# Patient Record
Sex: Female | Born: 1960 | Race: White | Hispanic: No | Smoking: Current some day smoker
Health system: Southern US, Community
[De-identification: ages and names within clinical notes are randomized; demographics above are authoritative.]

## PROBLEM LIST (undated history)

## (undated) DIAGNOSIS — M1990 Unspecified osteoarthritis, unspecified site: Secondary | ICD-10-CM

## (undated) DIAGNOSIS — R531 Weakness: Secondary | ICD-10-CM

## (undated) DIAGNOSIS — G8929 Other chronic pain: Secondary | ICD-10-CM

## (undated) DIAGNOSIS — F988 Other specified behavioral and emotional disorders with onset usually occurring in childhood and adolescence: Secondary | ICD-10-CM

## (undated) DIAGNOSIS — J45909 Unspecified asthma, uncomplicated: Secondary | ICD-10-CM

## (undated) DIAGNOSIS — G4700 Insomnia, unspecified: Secondary | ICD-10-CM

## (undated) DIAGNOSIS — F209 Schizophrenia, unspecified: Secondary | ICD-10-CM

## (undated) DIAGNOSIS — Z8719 Personal history of other diseases of the digestive system: Secondary | ICD-10-CM

## (undated) DIAGNOSIS — J42 Unspecified chronic bronchitis: Secondary | ICD-10-CM

## (undated) DIAGNOSIS — R109 Unspecified abdominal pain: Secondary | ICD-10-CM

## (undated) DIAGNOSIS — K5090 Crohn's disease, unspecified, without complications: Secondary | ICD-10-CM

## (undated) DIAGNOSIS — J189 Pneumonia, unspecified organism: Secondary | ICD-10-CM

## (undated) DIAGNOSIS — M62838 Other muscle spasm: Secondary | ICD-10-CM

## (undated) DIAGNOSIS — R011 Cardiac murmur, unspecified: Secondary | ICD-10-CM

## (undated) DIAGNOSIS — K219 Gastro-esophageal reflux disease without esophagitis: Secondary | ICD-10-CM

## (undated) DIAGNOSIS — M2550 Pain in unspecified joint: Secondary | ICD-10-CM

## (undated) DIAGNOSIS — E785 Hyperlipidemia, unspecified: Secondary | ICD-10-CM

## (undated) DIAGNOSIS — B1920 Unspecified viral hepatitis C without hepatic coma: Secondary | ICD-10-CM

## (undated) DIAGNOSIS — G43909 Migraine, unspecified, not intractable, without status migrainosus: Secondary | ICD-10-CM

## (undated) DIAGNOSIS — M545 Low back pain: Secondary | ICD-10-CM

## (undated) DIAGNOSIS — M542 Cervicalgia: Secondary | ICD-10-CM

## (undated) DIAGNOSIS — F410 Panic disorder [episodic paroxysmal anxiety] without agoraphobia: Secondary | ICD-10-CM

## (undated) DIAGNOSIS — M2540 Effusion, unspecified joint: Secondary | ICD-10-CM

## (undated) DIAGNOSIS — F419 Anxiety disorder, unspecified: Secondary | ICD-10-CM

## (undated) DIAGNOSIS — J302 Other seasonal allergic rhinitis: Secondary | ICD-10-CM

## (undated) HISTORY — PX: CHOLECYSTECTOMY: SHX55

## (undated) HISTORY — PX: UMBILICAL HERNIA REPAIR: SHX196

## (undated) HISTORY — PX: VAGINAL HYSTERECTOMY: SUR661

## (undated) HISTORY — PX: EXCISIONAL HEMORRHOIDECTOMY: SHX1541

## (undated) HISTORY — PX: COLONOSCOPY: SHX174

## (undated) HISTORY — PX: MANDIBLE FRACTURE SURGERY: SHX706

## (undated) HISTORY — PX: BACK SURGERY: SHX140

## (undated) HISTORY — PX: COLON RESECTION: SHX5231

## (undated) HISTORY — PX: FRACTURE SURGERY: SHX138

## (undated) HISTORY — PX: APPENDECTOMY: SHX54

## (undated) HISTORY — PX: FINGER AMPUTATION: SHX636

## (undated) HISTORY — PX: ESOPHAGOGASTRODUODENOSCOPY: SHX1529

---

## 2014-12-31 DIAGNOSIS — J189 Pneumonia, unspecified organism: Secondary | ICD-10-CM

## 2014-12-31 HISTORY — DX: Pneumonia, unspecified organism: J18.9

## 2015-08-10 ENCOUNTER — Emergency Department (HOSPITAL_COMMUNITY): Payer: Medicare Other

## 2015-08-10 ENCOUNTER — Inpatient Hospital Stay (HOSPITAL_COMMUNITY)
Admission: EM | Admit: 2015-08-10 | Discharge: 2015-08-15 | DRG: 194 | Disposition: A | Discharge status: Skilled Nursing Facility | Payer: Medicare Other | Attending: Internal Medicine | Admitting: Internal Medicine

## 2015-08-10 ENCOUNTER — Encounter (HOSPITAL_COMMUNITY): Payer: Self-pay | Admitting: Emergency Medicine

## 2015-08-10 DIAGNOSIS — G8929 Other chronic pain: Secondary | ICD-10-CM | POA: Diagnosis present

## 2015-08-10 DIAGNOSIS — F1120 Opioid dependence, uncomplicated: Secondary | ICD-10-CM | POA: Diagnosis present

## 2015-08-10 DIAGNOSIS — F419 Anxiety disorder, unspecified: Secondary | ICD-10-CM | POA: Diagnosis present

## 2015-08-10 DIAGNOSIS — F29 Unspecified psychosis not due to a substance or known physiological condition: Secondary | ICD-10-CM

## 2015-08-10 DIAGNOSIS — M549 Dorsalgia, unspecified: Secondary | ICD-10-CM | POA: Diagnosis present

## 2015-08-10 DIAGNOSIS — K5090 Crohn's disease, unspecified, without complications: Secondary | ICD-10-CM | POA: Diagnosis present

## 2015-08-10 DIAGNOSIS — R0789 Other chest pain: Secondary | ICD-10-CM

## 2015-08-10 DIAGNOSIS — R45851 Suicidal ideations: Secondary | ICD-10-CM

## 2015-08-10 DIAGNOSIS — F1110 Opioid abuse, uncomplicated: Secondary | ICD-10-CM

## 2015-08-10 DIAGNOSIS — F909 Attention-deficit hyperactivity disorder, unspecified type: Secondary | ICD-10-CM | POA: Diagnosis present

## 2015-08-10 DIAGNOSIS — J189 Pneumonia, unspecified organism: Secondary | ICD-10-CM | POA: Diagnosis not present

## 2015-08-10 DIAGNOSIS — M542 Cervicalgia: Secondary | ICD-10-CM | POA: Diagnosis present

## 2015-08-10 DIAGNOSIS — F209 Schizophrenia, unspecified: Secondary | ICD-10-CM | POA: Diagnosis present

## 2015-08-10 DIAGNOSIS — Z8249 Family history of ischemic heart disease and other diseases of the circulatory system: Secondary | ICD-10-CM

## 2015-08-10 DIAGNOSIS — R9431 Abnormal electrocardiogram [ECG] [EKG]: Secondary | ICD-10-CM | POA: Diagnosis present

## 2015-08-10 DIAGNOSIS — F329 Major depressive disorder, single episode, unspecified: Secondary | ICD-10-CM | POA: Diagnosis present

## 2015-08-10 DIAGNOSIS — F1910 Other psychoactive substance abuse, uncomplicated: Secondary | ICD-10-CM | POA: Diagnosis present

## 2015-08-10 DIAGNOSIS — I4510 Unspecified right bundle-branch block: Secondary | ICD-10-CM | POA: Diagnosis present

## 2015-08-10 DIAGNOSIS — G4700 Insomnia, unspecified: Secondary | ICD-10-CM | POA: Diagnosis present

## 2015-08-10 DIAGNOSIS — F17210 Nicotine dependence, cigarettes, uncomplicated: Secondary | ICD-10-CM | POA: Diagnosis present

## 2015-08-10 HISTORY — DX: Crohn's disease, unspecified, without complications: K50.90

## 2015-08-10 MED ORDER — SODIUM CHLORIDE 0.9 % IV BOLUS (SEPSIS)
1000.0000 mL | Freq: Once | INTRAVENOUS | Status: AC
  Administered 2015-08-11: 1000 mL via INTRAVENOUS

## 2015-08-10 NOTE — ED Notes (Signed)
Lab called

## 2015-08-10 NOTE — ED Notes (Signed)
Unable to collect labs at this time patient not in room.

## 2015-08-10 NOTE — ED Provider Notes (Signed)
CSN: [id number]     Arrival date & time 08/10/15  2110 History  This chart was scribed for Nicole Albe, MD by Doreatha Martin, ED Scribe. This patient was seen in room WA01/WA01 and the patient's care was started at 11:30 PM.     Chief Complaint  Patient presents with  . Fall  . Rib Injury  . Medical Clearance   The history is provided by the patient. No language interpreter was used.    HPI Comments: Nicole Fitzgerald is a 54 y.o. female with Hx of Chrohn's disease, chronic back pain who presents to the Emergency Department complaining of moderate drug abuse onset one year ago and worsened 3 months ago. Pt is here requesting help with detox. She states she is prescribed pain medicine however she has not been taking them as directed. She states associated leg pain, decreased appetite, sore throat, rhinorrhea, nausea, vomiting (3 episodes a day), diarrhea (4 times a day), productive cough (onset 2 days ago after falling and hitting her chest on a doorknob), urgency, fever (Tmax 102 two days ago) SI, visual hallucinations, auditory hallucinations, depression. She is taking Dilaudid (4 mg bid), Oxycodone (30 mg 4x a day), Opana (40 mg bid). She reports her last dose of pain medication was this morning. She states that she gets her medications from a PA in [facility] for her chronic pain. She notes her Dilaudid dose was recently decreased and this caused her depression to worsen. She states that she has not yet discussed her current issues with her psychiatrist. Her last visit was 4 months ago. She sees her psychiatrist for depression, visual and auditory hallucinations. She notes that the voices in her head have been telling her to die. She also notes that she sees things that are not there. She notes a Hx of suicide attempts via overdose. Pt states that her last overdose was a few years ago and did not seek help at that time. Pt currently lives with her son, who came to her house and took her yesterday. He states his  aunt called and told him she was concerned about his mother's mental and physical health. She is an occasional smoker (~one pack a week). Pt notes that she had a recent kidney infection and was seen for the issue but was not able to elaborate. She has been taking keflex for one month. She also notes recent falls and attributes this to the nausea she has been feeling from detox. She states that she hit her head and her chest (no LOC). She was seen in [facility] for the fall. She denies alcohol use, cocaine use, heroine use, dysuria, frequency, hematemesis, hematuria, blood in stool.   Pt sees a PA with cardiology in [facility], Mr [facility] Psychiatrist Dr. Evelene Croon  Past Medical History  Diagnosis Date  . Crohn's disease   . Chronic back pain    Past Surgical History  Procedure Laterality Date  . Cholecystectomy    . Abdominal hysterectomy    . Bowel resection     Family History  Problem Relation Age of Onset  . Hypertension Mother    Social History  Substance Use Topics  . Smoking status: Current Every Day Smoker    Types: Cigarettes  . Smokeless tobacco: None  . Alcohol Use: No   Recently taken into her son's house Denies street drugs Smokes 1/2 ppd  OB History    No data available     Review of Systems  Constitutional: Positive for  fever, chills and appetite change.  HENT: Positive for rhinorrhea and sore throat.   Respiratory: Positive for cough.   Gastrointestinal: Positive for nausea, vomiting and diarrhea. Negative for blood in stool.  Genitourinary: Positive for urgency. Negative for dysuria, frequency and hematuria.  Musculoskeletal: Positive for myalgias.  Psychiatric/Behavioral: Positive for suicidal ideas and hallucinations.  All other systems reviewed and are negative.  Allergies  Aspirin  Home Medications   Prior to Admission medications   Medication Sig Start Date End Date Taking? Authorizing Provider  AMITIZA 24 MCG capsule Take 1 capsule by mouth 2 (two)  times daily as needed. 07/13/15  Yes Historical Provider, MD  haloperidol (HALDOL) 2 MG tablet Take 1 tablet by mouth daily. 06/16/15  Yes Historical Provider, MD  HYDROmorphone (DILAUDID) 4 MG tablet Take 1 tablet by mouth 4 (four) times daily. 07/15/15  Yes Historical Provider, MD  levocetirizine (XYZAL) 5 MG tablet Take 1 tablet by mouth daily. 07/13/15  Yes Historical Provider, MD  LIALDA 1.2 G EC tablet Take 1 tablet by mouth 2 (two) times daily. 07/14/15  Yes Historical Provider, MD  LIDODERM 5 % Place 1 patch onto the skin 2 (two) times daily. 07/13/15  Yes Historical Provider, MD  methylphenidate (RITALIN) 20 MG tablet Take 1 tablet by mouth 3 (three) times daily. 07/20/15  Yes Historical Provider, MD  NEXIUM 40 MG capsule Take 1 capsule by mouth 2 (two) times daily. 07/13/15  Yes Historical Provider, MD  OLANZapine (ZYPREXA) 10 MG tablet Take 1 tablet by mouth daily. 07/13/15  Yes Historical Provider, MD  OLANZapine (ZYPREXA) 20 MG tablet Take 1 tablet by mouth daily. 07/13/15  Yes Historical Provider, MD  OPANA ER, CRUSH RESISTANT, 40 MG T12A Take 1 tablet by mouth 2 (two) times daily. 07/15/15  Yes Historical Provider, MD  oxycodone (ROXICODONE) 30 MG immediate release tablet Take 1 tablet by mouth 4 (four) times daily. 07/15/15  Yes Historical Provider, MD  potassium chloride SA (K-DUR,KLOR-CON) 20 MEQ tablet Take 1 tablet by mouth daily. 07/13/15  Yes Historical Provider, MD  predniSONE (DELTASONE) 20 MG tablet Take 1 tablet by mouth daily. 07/01/15  Yes Historical Provider, MD  PREMPRO 0.625-2.5 MG per tablet Take 1 tablet by mouth daily. 08/09/15  Yes Historical Provider, MD  PROAIR HFA 108 (90 BASE) MCG/ACT inhaler Inhale 1 puff into the lungs every 4 (four) hours as needed. 07/13/15  Yes Historical Provider, MD  traZODone (DESYREL) 100 MG tablet Take 1 tablet by mouth 2 (two) times daily. 07/13/15  Yes Historical Provider, MD  Prudy Feeler 1 MG tablet Take 1 tablet by mouth 4 (four) times daily. 07/14/15  Yes  Historical Provider, MD   BP 142/92 mmHg  Pulse 133  Temp(Src) 100.3 F (37.9 C) (Oral)  Resp 20  SpO2 95%  Vital signs normal except for tachycardia and low grade fever  Physical Exam  Constitutional: She is oriented to person, place, and time. She appears well-developed and well-nourished.  Non-toxic appearance. She does not appear ill. No distress.  HENT:  Head: Normocephalic and atraumatic.  Right Ear: External ear normal.  Left Ear: External ear normal.  Nose: Nose normal. No mucosal edema or rhinorrhea.  Mouth/Throat: Mucous membranes are normal. No dental abscesses or uvula swelling.  Tongue dry and coated.   Eyes: Conjunctivae and EOM are normal. Pupils are equal, round, and reactive to light.  Neck: Normal range of motion and full passive range of motion without pain. Neck supple.  Cardiovascular: Normal rate, regular rhythm  and normal heart sounds.  Exam reveals no gallop and no friction rub.   No murmur heard. Pulmonary/Chest: Effort normal. No respiratory distress. She has no wheezes. She has no rhonchi. She has no rales. She exhibits no tenderness and no crepitus.    Faint rhonchi.   Abdominal: Soft. Normal appearance and bowel sounds are normal. She exhibits no distension. There is no tenderness. There is no rebound and no guarding.  Musculoskeletal: Normal range of motion. She exhibits no edema or tenderness.  Moves all extremities well.   Neurological: She is alert and oriented to person, place, and time. She has normal strength. No cranial nerve deficit.  Skin: Skin is warm, dry and intact. No rash noted. No erythema. No pallor.  Skin is hot to the touch. Faint bruising on central anterior chest.   Psychiatric: Her speech is normal and behavior is normal. Her mood appears not anxious.  She is tearful on exam.   Nursing note and vitals reviewed.  ED Course  Procedures (including critical care time)  Medications  dextromethorphan-guaiFENesin (MUCINEX DM)  30-600 MG per 12 hr tablet 1 tablet (1 tablet Oral Given 08/11/15 0431)  azithromycin (ZITHROMAX) 500 mg in dextrose 5 % 250 mL IVPB (not administered)  dicyclomine (BENTYL) tablet 20 mg (not administered)  hydrOXYzine (ATARAX/VISTARIL) tablet 50 mg (not administered)  loperamide (IMODIUM) capsule 2-4 mg (not administered)  methocarbamol (ROBAXIN) tablet 1,000 mg (not administered)  ondansetron (ZOFRAN-ODT) disintegrating tablet 4 mg (not administered)  cloNIDine (CATAPRES) tablet 0.1 mg (not administered)    Followed by  cloNIDine (CATAPRES) tablet 0.1 mg (not administered)    Followed by  cloNIDine (CATAPRES) tablet 0.1 mg (not administered)  sodium chloride 0.9 % bolus 1,000 mL (1,000 mLs Intravenous New Bag/Given 08/11/15 0245)  sodium chloride 0.9 % bolus 1,000 mL (0 mLs Intravenous Stopped 08/11/15 0210)  ketorolac (TORADOL) 30 MG/ML injection 30 mg (30 mg Intravenous Given 08/11/15 0431)  cyclobenzaprine (FLEXERIL) tablet 10 mg (10 mg Oral Given 08/11/15 0431)  sodium chloride 0.9 % bolus 1,000 mL (1,000 mLs Intravenous New Bag/Given 08/11/15 0537)  cefTRIAXone (ROCEPHIN) 1 g in dextrose 5 % 50 mL IVPB (1 g Intravenous New Bag/Given 08/11/15 0536)    DIAGNOSTIC STUDIES: Oxygen Saturation is 95% on RA, adequate by my interpretation.    COORDINATION OF CARE: 11:49 PM Discussed treatment plan with patient and son  at bedside and they agreed to plan. Patient was given IV fluids. I discussed with patient and her son that if she wants narcotic detox that she will not be getting any narcotics for pain. She will be treated for with drawl. They state they understand that.  3:25 AM Reevaluated pt, who reports pain relief in the chest. Pt appears somnolent. Discussed normal XR findings with no rib fractures but recommends chest x-ray to further assess for possible infiltrate. Pt states she is "hurting all over". Ordered CXR and pain medication for body aches.    After reviewing patient's chest x-ray  she was started on IV Zithromax and IV Rocephin for community-acquired pneumonia. She was started on the clonidine withdrawal protocol for her narcotic withdrawal. At this point patient will need medical admission due to her pneumonia and fever and once she is improved she can have psychiatric evaluation.  05:10 am Dr. Julian Reil states to admit patient to telemetry   Labs Review Results for orders placed or performed during the hospital encounter of 08/10/15  Comprehensive metabolic panel  Result Value Ref Range   Sodium 134 (  L) 135 - 145 mmol/L   Potassium 4.5 3.5 - 5.1 mmol/L   Chloride 98 (L) 101 - 111 mmol/L   CO2 26 22 - 32 mmol/L   Glucose, Bld 107 (H) 65 - 99 mg/dL   BUN 15 6 - 20 mg/dL   Creatinine, Ser 1.61 (H) 0.44 - 1.00 mg/dL   Calcium 8.6 (L) 8.9 - 10.3 mg/dL   Total Protein 6.7 6.5 - 8.1 g/dL   Albumin 2.7 (L) 3.5 - 5.0 g/dL   AST 28 15 - 41 U/L   ALT 14 14 - 54 U/L   Alkaline Phosphatase 69 38 - 126 U/L   Total Bilirubin 0.5 0.3 - 1.2 mg/dL   GFR calc non Af Amer 60 (L) >60 mL/min   GFR calc Af Amer >60 >60 mL/min   Anion gap 10 5 - 15  Ethanol (ETOH)  Result Value Ref Range   Alcohol, Ethyl (B) <5 <5 mg/dL  Salicylate level  Result Value Ref Range   Salicylate Lvl <4.0 2.8 - 30.0 mg/dL  Acetaminophen level  Result Value Ref Range   Acetaminophen (Tylenol), Serum <10 (L) 10 - 30 ug/mL  CBC  Result Value Ref Range   WBC 10.9 (H) 4.0 - 10.5 K/uL   RBC 4.94 3.87 - 5.11 MIL/uL   Hemoglobin 14.1 12.0 - 15.0 g/dL   HCT 09.6 04.5 - 40.9 %   MCV 87.2 78.0 - 100.0 fL   MCH 28.5 26.0 - 34.0 pg   MCHC 32.7 30.0 - 36.0 g/dL   RDW 81.1 (H) 91.4 - 78.2 %   Platelets 263 150 - 400 K/uL  Urine rapid drug screen (hosp performed) (Not at [facility])  Result Value Ref Range   Opiates POSITIVE (A) NONE DETECTED   Cocaine NONE DETECTED NONE DETECTED   Benzodiazepines POSITIVE (A) NONE DETECTED   Amphetamines NONE DETECTED NONE DETECTED   Tetrahydrocannabinol NONE DETECTED NONE  DETECTED   Barbiturates NONE DETECTED NONE DETECTED  Urinalysis, Routine w reflex microscopic (not at [facility])  Result Value Ref Range   Color, Urine AMBER (A) YELLOW   APPearance CLOUDY (A) CLEAR   Specific Gravity, Urine 1.024 1.005 - 1.030   pH 6.0 5.0 - 8.0   Glucose, UA NEGATIVE NEGATIVE mg/dL   Hgb urine dipstick TRACE (A) NEGATIVE   Bilirubin Urine SMALL (A) NEGATIVE   Ketones, ur NEGATIVE NEGATIVE mg/dL   Protein, ur NEGATIVE NEGATIVE mg/dL   Urobilinogen, UA 1.0 0.0 - 1.0 mg/dL   Nitrite NEGATIVE NEGATIVE   Leukocytes, UA NEGATIVE NEGATIVE  Urine microscopic-add on  Result Value Ref Range   Squamous Epithelial / LPF MANY (A) RARE   RBC / HPF 0-2 <3 RBC/hpf   Bacteria, UA FEW (A) RARE    Laboratory interpretation all normal except mild leukocytosis, positive UDS    Imaging Review Dg Chest 2 View  08/11/2015   CLINICAL DATA:  Acute onset of shortness of breath. Left anterior rib pain. Initial encounter.  EXAM: CHEST  2 VIEW  COMPARISON:  Chest radiograph performed 08/10/2015  FINDINGS: The lungs are well-aerated. Mild left lower lobe pneumonia is noted. There is no evidence of pleural effusion or pneumothorax.  The heart is normal in size; the mediastinal contour is within normal limits. No acute osseous abnormalities are seen.  IMPRESSION: Mild left lower lobe pneumonia noted.   Electronically Signed   By: Roanna Raider M.D.   On: 08/11/2015 04:06   Dg Ribs Unilateral W/chest Left  08/11/2015  CLINICAL DATA:  Larey Seat at home August 08, 2015, LEFT rib pain. Increasing shortness of breath.  EXAM: LEFT RIBS AND CHEST - 3+ VIEW  COMPARISON:  None.  FINDINGS: No fracture or other bone lesions are seen involving the ribs. There is no evidence of pneumothorax or pleural effusion. Patchy LEFT lower lobe airspace opacity, to lesser extent on the RIGHT. Heart size and mediastinal contours are within normal limits. Surgical clips in the included right abdomen compatible with cholecystectomy.   IMPRESSION: No acute rib fracture deformity.  Patchy bibasilar airspace opacities, nonspecific and can be seen with pectus excavatum though, would be better characterized and PA and lateral views of the chest when clinically able.   Electronically Signed   By: Awilda Metro M.D.   On: 08/11/2015 00:29   Ct Head Wo Contrast  08/11/2015   CLINICAL DATA:  Acute onset of visual and auditory hallucinations. Initial encounter.  EXAM: CT HEAD WITHOUT CONTRAST  TECHNIQUE: Contiguous axial images were obtained from the base of the skull through the vertex without intravenous contrast.  COMPARISON:  None.  FINDINGS: There is no evidence of acute infarction, mass lesion, or intra- or extra-axial hemorrhage on CT.  Minimal subcortical white matter change likely reflects small vessel ischemic microangiopathy.  The posterior fossa, including the cerebellum, brainstem and fourth ventricle, is within normal limits. The third and lateral ventricles, and basal ganglia are unremarkable in appearance. The cerebral hemispheres are symmetric in appearance, with normal gray-white differentiation. No mass effect or midline shift is seen.  There is no evidence of fracture; visualized osseous structures are unremarkable in appearance. The orbits are within normal limits. The paranasal sinuses and mastoid air cells are well-aerated. No significant soft tissue abnormalities are seen.  IMPRESSION: 1. No acute intracranial pathology seen on CT. 2. Minimal small vessel ischemic microangiopathy.   Electronically Signed   By: Roanna Raider M.D.   On: 08/11/2015 01:55     EKG Interpretation None      MDM   Final diagnoses:  CAP (community acquired pneumonia)  Narcotic abuse  Psychosis, unspecified psychosis type  Suicidal thoughts  Left-sided chest wall pain   Plan admission  Nicole Albe, MD, FACEP   I personally performed the services described in this documentation, which was scribed in my presence. The recorded  information has been reviewed and considered.  Nicole Albe, MD, FACEP Nicole Albe, MD 08/11/15 [phone]

## 2015-08-10 NOTE — ED Notes (Signed)
Pt states she fell at home and injured her left rib cage  Pt states she has a sharp pain in her chest on the left side when she breathes or moves  Pt states she also has chronic back pain in which she takes medication for along with crohns disease  Pt states she has been abusing her pain medication for years  Son at bedside states he received a call from a family member telling him how she has been abusing her medications  He said he went and picked her up yesterday and brought her to his house and today she called him begging him to bring her here for help with her problem  Pt is crying in triage room  Pt states she really needs help

## 2015-08-10 NOTE — ED Notes (Signed)
Unable to obtain lab work due to difficulty of stick.

## 2015-08-11 ENCOUNTER — Emergency Department (HOSPITAL_COMMUNITY): Payer: Medicare Other

## 2015-08-11 DIAGNOSIS — J189 Pneumonia, unspecified organism: Secondary | ICD-10-CM | POA: Diagnosis present

## 2015-08-11 DIAGNOSIS — F419 Anxiety disorder, unspecified: Secondary | ICD-10-CM | POA: Diagnosis present

## 2015-08-11 DIAGNOSIS — F329 Major depressive disorder, single episode, unspecified: Secondary | ICD-10-CM | POA: Diagnosis present

## 2015-08-11 DIAGNOSIS — G4700 Insomnia, unspecified: Secondary | ICD-10-CM | POA: Diagnosis present

## 2015-08-11 DIAGNOSIS — M542 Cervicalgia: Secondary | ICD-10-CM | POA: Diagnosis present

## 2015-08-11 DIAGNOSIS — F17210 Nicotine dependence, cigarettes, uncomplicated: Secondary | ICD-10-CM | POA: Diagnosis present

## 2015-08-11 DIAGNOSIS — G8929 Other chronic pain: Secondary | ICD-10-CM | POA: Diagnosis present

## 2015-08-11 DIAGNOSIS — I4510 Unspecified right bundle-branch block: Secondary | ICD-10-CM | POA: Diagnosis present

## 2015-08-11 DIAGNOSIS — F411 Generalized anxiety disorder: Secondary | ICD-10-CM

## 2015-08-11 DIAGNOSIS — F1910 Other psychoactive substance abuse, uncomplicated: Secondary | ICD-10-CM | POA: Diagnosis not present

## 2015-08-11 DIAGNOSIS — F333 Major depressive disorder, recurrent, severe with psychotic symptoms: Secondary | ICD-10-CM | POA: Diagnosis not present

## 2015-08-11 DIAGNOSIS — F909 Attention-deficit hyperactivity disorder, unspecified type: Secondary | ICD-10-CM

## 2015-08-11 DIAGNOSIS — M549 Dorsalgia, unspecified: Secondary | ICD-10-CM | POA: Diagnosis present

## 2015-08-11 DIAGNOSIS — Z8249 Family history of ischemic heart disease and other diseases of the circulatory system: Secondary | ICD-10-CM | POA: Diagnosis not present

## 2015-08-11 DIAGNOSIS — F209 Schizophrenia, unspecified: Secondary | ICD-10-CM | POA: Diagnosis present

## 2015-08-11 DIAGNOSIS — F1110 Opioid abuse, uncomplicated: Secondary | ICD-10-CM | POA: Diagnosis not present

## 2015-08-11 DIAGNOSIS — F1120 Opioid dependence, uncomplicated: Secondary | ICD-10-CM | POA: Diagnosis present

## 2015-08-11 DIAGNOSIS — K5090 Crohn's disease, unspecified, without complications: Secondary | ICD-10-CM | POA: Diagnosis present

## 2015-08-11 LAB — RAPID URINE DRUG SCREEN, HOSP PERFORMED
Amphetamines: NOT DETECTED
Barbiturates: NOT DETECTED
Benzodiazepines: POSITIVE — AB
Cocaine: NOT DETECTED
Opiates: POSITIVE — AB
Tetrahydrocannabinol: NOT DETECTED

## 2015-08-11 LAB — CBC
HCT: 35.4 % — ABNORMAL LOW (ref 36.0–46.0)
HEMATOCRIT: 43.1 % (ref 36.0–46.0)
Hemoglobin: 14.1 g/dL (ref 12.0–15.0)
Hemoglobin: 11.9 g/dL — ABNORMAL LOW (ref 12.0–15.0)
MCH: 28.5 pg (ref 26.0–34.0)
MCH: 29.1 pg (ref 26.0–34.0)
MCHC: 32.7 g/dL (ref 30.0–36.0)
MCHC: 33.6 g/dL (ref 30.0–36.0)
MCV: 86.6 fL (ref 78.0–100.0)
MCV: 87.2 fL (ref 78.0–100.0)
PLATELETS: 189 10*3/uL (ref 150–400)
Platelets: 263 10*3/uL (ref 150–400)
RBC: 4.94 MIL/uL (ref 3.87–5.11)
RBC: 4.09 MIL/uL (ref 3.87–5.11)
RDW: 16.2 % — ABNORMAL HIGH (ref 11.5–15.5)
RDW: 16.4 % — ABNORMAL HIGH (ref 11.5–15.5)
WBC: 6.2 10*3/uL (ref 4.0–10.5)
WBC: 10.9 10*3/uL — AB (ref 4.0–10.5)

## 2015-08-11 LAB — ACETAMINOPHEN LEVEL: Acetaminophen (Tylenol), Serum: <10 ug/mL — ABNORMAL LOW (ref 10–30)

## 2015-08-11 LAB — COMPREHENSIVE METABOLIC PANEL
ALK PHOS: 69 U/L (ref 38–126)
ALT: 14 U/L (ref 14–54)
AST: 28 U/L (ref 15–41)
Albumin: 2.7 g/dL — ABNORMAL LOW (ref 3.5–5.0)
Anion gap: 10 (ref 5–15)
BUN: 15 mg/dL (ref 6–20)
CHLORIDE: 98 mmol/L — AB (ref 101–111)
CO2: 26 mmol/L (ref 22–32)
Calcium: 8.6 mg/dL — ABNORMAL LOW (ref 8.9–10.3)
Creatinine, Ser: 1.03 mg/dL — ABNORMAL HIGH (ref 0.44–1.00)
GFR calc Af Amer: >60 mL/min (ref >60)
GFR, EST NON AFRICAN AMERICAN: 60 mL/min — AB (ref >60)
Glucose, Bld: 107 mg/dL — ABNORMAL HIGH (ref 65–99)
POTASSIUM: 4.5 mmol/L (ref 3.5–5.1)
SODIUM: 134 mmol/L — AB (ref 135–145)
Total Bilirubin: 0.5 mg/dL (ref 0.3–1.2)
Total Protein: 6.7 g/dL (ref 6.5–8.1)

## 2015-08-11 LAB — CREATININE, SERUM
Creatinine, Ser: 0.82 mg/dL (ref 0.44–1.00)
GFR calc non Af Amer: >60 mL/min (ref >60)

## 2015-08-11 LAB — URINALYSIS, ROUTINE W REFLEX MICROSCOPIC
GLUCOSE, UA: NEGATIVE mg/dL
Ketones, ur: NEGATIVE mg/dL
Leukocytes, UA: NEGATIVE
Nitrite: NEGATIVE
PROTEIN: NEGATIVE mg/dL
Specific Gravity, Urine: 1.024 (ref 1.005–1.030)
UROBILINOGEN UA: 1 mg/dL (ref 0.0–1.0)
pH: 6 (ref 5.0–8.0)

## 2015-08-11 LAB — TSH: TSH: 1.065 u[IU]/mL (ref 0.350–4.500)

## 2015-08-11 LAB — ETHANOL

## 2015-08-11 LAB — URINE MICROSCOPIC-ADD ON

## 2015-08-11 LAB — SALICYLATE LEVEL

## 2015-08-11 MED ORDER — METHOCARBAMOL 500 MG PO TABS
500.0000 mg | ORAL_TABLET | Freq: Three times a day (TID) | ORAL | Status: DC | PRN
  Administered 2015-08-12 – 2015-08-14 (×3): 500 mg via ORAL
  Filled 2015-08-11 (×3): qty 1

## 2015-08-11 MED ORDER — SODIUM CHLORIDE 0.9 % IJ SOLN
3.0000 mL | Freq: Two times a day (BID) | INTRAMUSCULAR | Status: DC
  Administered 2015-08-11: 3 mL via INTRAVENOUS

## 2015-08-11 MED ORDER — ACETAMINOPHEN 325 MG PO TABS
650.0000 mg | ORAL_TABLET | Freq: Four times a day (QID) | ORAL | Status: DC | PRN
  Administered 2015-08-15: 650 mg via ORAL
  Filled 2015-08-11: qty 2

## 2015-08-11 MED ORDER — ONDANSETRON 4 MG PO TBDP
4.0000 mg | ORAL_TABLET | Freq: Four times a day (QID) | ORAL | Status: DC | PRN
  Administered 2015-08-13: 4 mg via ORAL
  Filled 2015-08-11: qty 1

## 2015-08-11 MED ORDER — CLONIDINE HCL 0.1 MG PO TABS
0.1000 mg | ORAL_TABLET | ORAL | Status: AC
  Administered 2015-08-13 – 2015-08-14 (×4): 0.1 mg via ORAL
  Filled 2015-08-11 (×4): qty 1

## 2015-08-11 MED ORDER — DM-GUAIFENESIN ER 30-600 MG PO TB12
1.0000 | ORAL_TABLET | Freq: Two times a day (BID) | ORAL | Status: DC
  Administered 2015-08-11: 1 via ORAL
  Filled 2015-08-11 (×3): qty 1

## 2015-08-11 MED ORDER — KETOROLAC TROMETHAMINE 30 MG/ML IJ SOLN
30.0000 mg | Freq: Three times a day (TID) | INTRAMUSCULAR | Status: DC | PRN
  Administered 2015-08-11 – 2015-08-12 (×2): 30 mg via INTRAVENOUS
  Filled 2015-08-11 (×3): qty 1

## 2015-08-11 MED ORDER — TRAZODONE HCL 100 MG PO TABS
100.0000 mg | ORAL_TABLET | Freq: Two times a day (BID) | ORAL | Status: DC
  Administered 2015-08-11 – 2015-08-15 (×9): 100 mg via ORAL
  Filled 2015-08-11 (×3): qty 2
  Filled 2015-08-11 (×4): qty 1
  Filled 2015-08-11: qty 2
  Filled 2015-08-11 (×6): qty 1

## 2015-08-11 MED ORDER — CONJ ESTROG-MEDROXYPROGEST ACE 0.625-2.5 MG PO TABS: 1.0000 | ORAL_TABLET | Freq: Every day | ORAL | Status: DC

## 2015-08-11 MED ORDER — DICYCLOMINE HCL 20 MG PO TABS
20.0000 mg | ORAL_TABLET | Freq: Four times a day (QID) | ORAL | Status: DC | PRN
  Filled 2015-08-11: qty 1

## 2015-08-11 MED ORDER — HYDROXYZINE HCL 25 MG PO TABS: 50.0000 mg | ORAL_TABLET | Freq: Four times a day (QID) | ORAL | Status: DC | PRN

## 2015-08-11 MED ORDER — SODIUM CHLORIDE 0.9 % IV BOLUS (SEPSIS)
1000.0000 mL | Freq: Once | INTRAVENOUS | Status: AC
  Administered 2015-08-11: 1000 mL via INTRAVENOUS

## 2015-08-11 MED ORDER — DEXTROSE 5 % IV SOLN
500.0000 mg | INTRAVENOUS | Status: DC
  Administered 2015-08-12: 500 mg via INTRAVENOUS
  Filled 2015-08-11 (×2): qty 500

## 2015-08-11 MED ORDER — LOPERAMIDE HCL 2 MG PO CAPS: 2.0000 mg | ORAL_CAPSULE | ORAL | Status: DC | PRN

## 2015-08-11 MED ORDER — HYDROXYZINE HCL 25 MG PO TABS
25.0000 mg | ORAL_TABLET | Freq: Four times a day (QID) | ORAL | Status: DC | PRN
  Administered 2015-08-11 – 2015-08-15 (×4): 25 mg via ORAL
  Filled 2015-08-11 (×4): qty 1

## 2015-08-11 MED ORDER — HALOPERIDOL 2 MG PO TABS
2.0000 mg | ORAL_TABLET | Freq: Every day | ORAL | Status: DC
  Administered 2015-08-11 – 2015-08-12 (×2): 2 mg via ORAL
  Filled 2015-08-11 (×2): qty 1

## 2015-08-11 MED ORDER — PANTOPRAZOLE SODIUM 40 MG PO TBEC
40.0000 mg | DELAYED_RELEASE_TABLET | Freq: Every day | ORAL | Status: DC
  Administered 2015-08-11 – 2015-08-15 (×5): 40 mg via ORAL
  Filled 2015-08-11 (×7): qty 1

## 2015-08-11 MED ORDER — ENOXAPARIN SODIUM 40 MG/0.4ML ~~LOC~~ SOLN
40.0000 mg | SUBCUTANEOUS | Status: DC
  Administered 2015-08-11 – 2015-08-14 (×4): 40 mg via SUBCUTANEOUS
  Filled 2015-08-11 (×5): qty 0.4

## 2015-08-11 MED ORDER — KETOROLAC TROMETHAMINE 30 MG/ML IJ SOLN
30.0000 mg | Freq: Once | INTRAMUSCULAR | Status: AC
  Administered 2015-08-11: 30 mg via INTRAVENOUS
  Filled 2015-08-11: qty 1

## 2015-08-11 MED ORDER — PREDNISONE 20 MG PO TABS
20.0000 mg | ORAL_TABLET | Freq: Every day | ORAL | Status: DC
  Administered 2015-08-11 – 2015-08-12 (×2): 20 mg via ORAL
  Filled 2015-08-11 (×3): qty 1

## 2015-08-11 MED ORDER — CLONIDINE HCL 0.1 MG PO TABS
0.1000 mg | ORAL_TABLET | Freq: Four times a day (QID) | ORAL | Status: DC
  Filled 2015-08-11 (×4): qty 1

## 2015-08-11 MED ORDER — CLONIDINE HCL 0.1 MG PO TABS
0.1000 mg | ORAL_TABLET | Freq: Four times a day (QID) | ORAL | Status: AC
  Administered 2015-08-11 – 2015-08-12 (×8): 0.1 mg via ORAL
  Filled 2015-08-11 (×8): qty 1

## 2015-08-11 MED ORDER — DEXTROSE 5 % IV SOLN
500.0000 mg | Freq: Once | INTRAVENOUS | Status: AC
  Administered 2015-08-11: 500 mg via INTRAVENOUS
  Filled 2015-08-11: qty 500

## 2015-08-11 MED ORDER — METHYLPHENIDATE HCL 5 MG PO TABS
20.0000 mg | ORAL_TABLET | Freq: Three times a day (TID) | ORAL | Status: DC
  Administered 2015-08-11 – 2015-08-12 (×4): 20 mg via ORAL
  Filled 2015-08-11 (×4): qty 4

## 2015-08-11 MED ORDER — CLONIDINE HCL 0.1 MG PO TABS: 0.1000 mg | ORAL_TABLET | Freq: Every day | ORAL | Status: DC

## 2015-08-11 MED ORDER — METHOCARBAMOL 500 MG PO TABS: 1000.0000 mg | ORAL_TABLET | Freq: Four times a day (QID) | ORAL | Status: DC | PRN

## 2015-08-11 MED ORDER — ALUM & MAG HYDROXIDE-SIMETH 200-200-20 MG/5ML PO SUSP: 30.0000 mL | Freq: Four times a day (QID) | ORAL | Status: DC | PRN

## 2015-08-11 MED ORDER — DEXTROSE 5 % IV SOLN
1.0000 g | Freq: Once | INTRAVENOUS | Status: AC
  Administered 2015-08-11: 1 g via INTRAVENOUS
  Filled 2015-08-11: qty 10

## 2015-08-11 MED ORDER — BENZONATATE 100 MG PO CAPS
100.0000 mg | ORAL_CAPSULE | Freq: Three times a day (TID) | ORAL | Status: DC | PRN
  Administered 2015-08-13: 100 mg via ORAL
  Filled 2015-08-11: qty 1

## 2015-08-11 MED ORDER — LORATADINE 10 MG PO TABS
10.0000 mg | ORAL_TABLET | Freq: Every day | ORAL | Status: DC
  Administered 2015-08-11 – 2015-08-15 (×5): 10 mg via ORAL
  Filled 2015-08-11 (×5): qty 1

## 2015-08-11 MED ORDER — ALBUTEROL SULFATE (2.5 MG/3ML) 0.083% IN NEBU: 3.0000 mL | INHALATION_SOLUTION | RESPIRATORY_TRACT | Status: DC | PRN

## 2015-08-11 MED ORDER — ESTROGENS CONJUGATED 0.625 MG PO TABS
0.6250 mg | ORAL_TABLET | Freq: Every day | ORAL | Status: DC
  Administered 2015-08-11 – 2015-08-15 (×5): 0.625 mg via ORAL
  Filled 2015-08-11 (×5): qty 1

## 2015-08-11 MED ORDER — MESALAMINE 1.2 G PO TBEC
1.2000 g | DELAYED_RELEASE_TABLET | Freq: Two times a day (BID) | ORAL | Status: DC
  Administered 2015-08-11 – 2015-08-15 (×7): 1.2 g via ORAL
  Filled 2015-08-11 (×11): qty 1

## 2015-08-11 MED ORDER — CLONIDINE HCL 0.1 MG PO TABS: 0.1000 mg | ORAL_TABLET | ORAL | Status: DC

## 2015-08-11 MED ORDER — ONDANSETRON 4 MG PO TBDP: 4.0000 mg | ORAL_TABLET | Freq: Four times a day (QID) | ORAL | Status: DC | PRN

## 2015-08-11 MED ORDER — MEDROXYPROGESTERONE ACETATE 2.5 MG PO TABS
2.5000 mg | ORAL_TABLET | Freq: Every day | ORAL | Status: DC
  Administered 2015-08-11 – 2015-08-15 (×5): 2.5 mg via ORAL
  Filled 2015-08-11 (×5): qty 1

## 2015-08-11 MED ORDER — ACETAMINOPHEN 650 MG RE SUPP: 650.0000 mg | Freq: Four times a day (QID) | RECTAL | Status: DC | PRN

## 2015-08-11 MED ORDER — ALPRAZOLAM 1 MG PO TABS
1.0000 mg | ORAL_TABLET | Freq: Two times a day (BID) | ORAL | Status: DC
  Administered 2015-08-11 – 2015-08-13 (×5): 1 mg via ORAL
  Filled 2015-08-11 (×5): qty 1

## 2015-08-11 MED ORDER — DEXTROSE 5 % IV SOLN
1.0000 g | INTRAVENOUS | Status: DC
  Administered 2015-08-12 – 2015-08-13 (×2): 1 g via INTRAVENOUS
  Filled 2015-08-11 (×2): qty 10

## 2015-08-11 MED ORDER — OLANZAPINE 10 MG PO TABS
10.0000 mg | ORAL_TABLET | Freq: Every day | ORAL | Status: DC
  Administered 2015-08-11 – 2015-08-15 (×5): 10 mg via ORAL
  Filled 2015-08-11 (×5): qty 1

## 2015-08-11 MED ORDER — LEVOCETIRIZINE DIHYDROCHLORIDE 5 MG PO TABS: 5.0000 mg | ORAL_TABLET | Freq: Every day | ORAL | Status: DC

## 2015-08-11 MED ORDER — SODIUM CHLORIDE 0.9 % IV SOLN
INTRAVENOUS | Status: DC
  Administered 2015-08-11 – 2015-08-13 (×2): via INTRAVENOUS

## 2015-08-11 MED ORDER — CYCLOBENZAPRINE HCL 10 MG PO TABS
10.0000 mg | ORAL_TABLET | Freq: Once | ORAL | Status: AC
  Administered 2015-08-11: 10 mg via ORAL
  Filled 2015-08-11: qty 1

## 2015-08-11 MED ORDER — CLONIDINE HCL 0.1 MG PO TABS
0.1000 mg | ORAL_TABLET | Freq: Every day | ORAL | Status: DC
  Administered 2015-08-15: 0.1 mg via ORAL
  Filled 2015-08-11 (×3): qty 1

## 2015-08-11 NOTE — Progress Notes (Addendum)
Pt gave CSW permission to speak with pt son, Arvilla Market, [phone]. CSW called and left message.   Olga Coaster, LCSW  Clinical Social Work  [employer] [phone]     CSW received call back from pt son. Pt son shared that patient sister are also concerned about patient due to voices and seeing people that are not there. Pt sisters reported to son, that patient was seeing a little boy when family was visiting and later called the police because the 56 year old boy was missing from her house who was never there. Pt son states that patient has been telling family members that she doesn't want to live anymore. Pt family states that patient has been abusing the pain medication that she is prescribed and has not received much help from the providers regarding her addiction to the pain medication. Pt has voiced to several family members over the past week of voices telling her to kill herself. Pt son states that he had patient come to stay with him for 24 hours prior to coming to the hospital and she told her daughter in law that she wanted to die and not to tell anyone. Patient son states that once patient completes treatment he plans to have patient move in with him so he can provide further support. Pt son states that patient has a history of mental health issues and sees a psychiatrist. Pt son states patient is on disabality due to her nerves.   Olga Coaster, LCSW  Clinical Social Work  [employer] [phone]

## 2015-08-11 NOTE — Care Management Note (Signed)
Case Management Note  Patient Details  Name: Nicole Fitzgerald MRN: [id number] Date of Birth: November 30, 1961  Subjective/Objective:                 Drug withdrawal   Action/Plan:Date:  August 11, 2015 U.R. performed for needs and level of care. Will continue to follow for Case Management needs.  Marcelle Smiling, RN, BSN, [place]   [phone]   Expected Discharge Date:   (UNKNOWN)               Expected Discharge Plan:  Home/Self Care  In-House Referral:  Clinical Social Work  Discharge planning Services  CM Consult  Post Acute Care Choice:  NA Choice offered to:  NA  DME Arranged:  N/A DME Agency:  NA  HH Arranged:  NA HH Agency:  NA  Status of Service:  In process, will continue to follow  Medicare Important Message Given:    Date Medicare IM Given:    Medicare IM give by:    Date Additional Medicare IM Given:    Additional Medicare Important Message give by:     If discussed at Long Length of Stay Meetings, dates discussed:    Additional Comments:  Golda Acre, RN 08/11/2015, 10:42 AM

## 2015-08-11 NOTE — Clinical Social Work Psych Assess (Signed)
Clinical Social Work Programme researcher, broadcasting/film/video Social Worker:  Antony Salmon Date/Time:  08/11/2015, 10:01 AM Referred By:  Physician Date Referred:  08/11/15 Reason for Referral:  Behavioral Health Issues   Presenting Symptoms/Problems  Presenting Symptoms/Problems(in person's/family's own words):  Pt states, "I came to the hospital because I wanted to give up. My own doctor doesn't see that I'm dying from these opiates, and continues to prescribe them to me. I've been a patient for over 20 years." Patient states, "he has been trying to wean me off of one of them but its not doing any good. " Patient reports I was suicidal when I came in, but no longer suicidal, I just want help." patient reports, "I've been having voices telling me I'd be better off dead."    Abuse/Neglect/Trauma History  Abuse/Neglect/Trauma History:  Denies History Abuse/Neglect/Trauma History Comments (indicate dates):     Psychiatric History  Psychiatric History:  Inpatient/Hospitalization, Outpatient Treatment, Residential Treatment Psychiatric Medication:     Current Mental Health Hospitalizations/Previous Mental Health History: Patient reports previous inpatient hospitalizations due to previous SI attempts and hallucinations and cocaine abuse.    Current Provider:  Dr. Harlow Asa and Date:  October 11, 2015   Current Medications:  Unable to get medication list from patient. Per chart Zoloft    Previous Inpatient Admission/Date/Reason:  Cocaine abuse, voices, SI, 10 years ago   Emotional Health/Current Symptoms  Suicide/Self Harm: Suicide Attempt in the Past (date/description), Suicidal Ideation (ex. "I can't take anymore, I wish I could disappear") (years ago ) Suicide Attempt in Past (date/description):  Multiple   Other Harmful Behavior (ex. homicidal ideation) (describe):  none   Psychotic/Dissociative Symptoms  Psychotic/Dissociative Symptoms: Auditory Hallucinations  (voices telling pt, "you'd be better off dead" ) Other Psychotic/Dissociative Symptoms:  Voices telling, "You'd be better off dead" Pt denying any current auditory/visual hallucinations  Attention/Behavioral Symptoms  Attention/Behavioral Symptoms: Within Normal Limits Other Attention/Behavioral Symptoms:     Cognitive Impairment  Cognitive Impairment:  Within Normal Limits, Orientation - Place, Orientation - Self, Orientation - Situation Other Cognitive Impairment:     Mood and Adjustment  Mood and Adjustment:  Depression   Stress, Anxiety, Trauma, Any Recent Loss/Stressor  Stress, Anxiety, Trauma, Any Recent Loss/Stressor: Other - See Comment (opiate addiction, auditory hallucinations past week telling pt shed be better off dead) Anxiety (frequency):    Phobia (specify):    Compulsive Behavior (specify):    Obsessive Behavior (specify):    Other Stress, Anxiety, Trauma, Any Recent Loss/Stressor:     Substance Abuse/Use  Substance Abuse/Use: Current Substance Use SBIRT Completed (please refer for detailed history): No Self-reported Substance Use (last use and frequency):    Urinary Drug Screen Completed: Yes Alcohol Level:    Environment/Housing/Living Arrangement  Environmental/Housing/Living Arrangement: Stable Housing Who is in the Home:   Alone  Emergency Contact:  Arvilla Market   Financial  Financial: Medicare managed   Patient's Strengths and Goals  Patient's Strengths and Goals (patient's own words):  Pt strengths, Identifying her own addiction to Opiates and need for detox/rehab. Patient strengths include having outpatient provider. Pt is open to counseling services    Clinical Social Worker's Interpretive Summary  Clinical Social Workers Interpretive Summary:  Patient currently denying Si/HI and AVH. Patient states that when she came to the hospital she was just wanting to give up due to the opiate addiction. Pt states she feels hopeless  considering the medication she is prescribed is her addiction. Pt denies  any plan or attempt. Pt does shared a previous SI attempts and hallucinations telling patient she would be better off dead. Patient however currently able to contract for safety. Pt may benefit from inpatient psychiatric treatment once medically stable.   Pt to be seen by psychiatrist for final disposition.    Disposition  Disposition: Recommend Psych CSW Continuing To Support While In [facility]

## 2015-08-11 NOTE — ED Notes (Addendum)
Report called to [facility] on 5E. Pt can be transported after shift change.

## 2015-08-11 NOTE — Consult Note (Signed)
[facility] Consult   Reason for Consult:  Psychotic hallucination and opioid dependence Referring Physician:  Dr. Alcario Drought Patient Identification: Nicole Fitzgerald MRN:  [id number] Principal Diagnosis: Schizophrenia Diagnosis:   Patient Active Problem List   Diagnosis Date Noted  . Schizophrenia [F20.9] 08/11/2015  . CAP (community acquired pneumonia) [J18.9] 08/11/2015  . Crohn's disease [K50.90] 08/11/2015  . PNA (pneumonia) [J18.9] 08/11/2015    Total Time spent with patient: 1 hour  Subjective:   Nicole Fitzgerald is a 54 y.o. female patient admitted with asking opioid detox.   HPI:  Nicole Fitzgerald is a 54 y.o. female seen for face-to-face psychiatry consultation and evaluation of hallucinations and detox treatment for opiates. Patient reported that she was initially came to the hospital with community-acquired pneumonia and then seeking detox treatment for opiates. Patient reportedly suffering with chronic mental illness especially psychosis, anxiety, ADHD and has been receiving outpatient medication management from [facility] in [place]. Patient reportedly was seen Dr. Claiborne Billings and Dr. Andria Rhein in the past 25 years. Patient also reportedly she has been placed on disability secondary to having a severe symptoms of auditory/visual hallucinations and paranoia. Patient has been taking multiple psychiatric medication. She has been taking pain medication for chronic stasis. Patient urine drug screen, back positive for opiates and benzodiazepine's probably due to her description medication. Patient has  visual and auditory hallucinations over the past week. She currently denies suicidal or homicidal ideations, Intention or plans. Patient has no recent acute psychiatric hospitalizations. Patient has been staying with her son and mostly staying herself in the home and has limited outings.  HPI Elements:   Location:  Psychosis, anxiety and ADHD. Quality:  Poor. Severity:   Moderate. Timing:  Multiple medication. Duration:  2 weeks. Context:  Psychosocial stresses.  Past Medical History:  Past Medical History  Diagnosis Date  . Crohn's disease   . Chronic back pain     Past Surgical History  Procedure Laterality Date  . Cholecystectomy    . Abdominal hysterectomy    . Bowel resection     Family History:  Family History  Problem Relation Age of Onset  . Hypertension Mother    Social History:  History  Alcohol Use No     History  Drug Use No    Social History   Social History  . Marital Status: Legally Separated    Spouse Name: N/A  . Number of Children: N/A  . Years of Education: N/A   Social History Main Topics  . Smoking status: Current Every Day Smoker    Types: Cigarettes  . Smokeless tobacco: None  . Alcohol Use: No  . Drug Use: No  . Sexual Activity: Not Asked   Other Topics Concern  . None   Social History Narrative  . None   Additional Social History:                          Allergies:   Allergies  Allergen Reactions  . Aspirin Other (See Comments)    MD told not to take due to crohns    Labs:  Results for orders placed or performed during the hospital encounter of 08/10/15 (from the past 48 hour(s))  Comprehensive metabolic panel     Status: Abnormal   Collection Time: 08/11/15 12:56 AM  Result Value Ref Range   Sodium 134 (L) 135 - 145 mmol/L   Potassium 4.5 3.5 - 5.1 mmol/L   Chloride 98 (L)  101 - 111 mmol/L   CO2 26 22 - 32 mmol/L   Glucose, Bld 107 (H) 65 - 99 mg/dL   BUN 15 6 - 20 mg/dL   Creatinine, Ser 1.03 (H) 0.44 - 1.00 mg/dL   Calcium 8.6 (L) 8.9 - 10.3 mg/dL   Total Protein 6.7 6.5 - 8.1 g/dL   Albumin 2.7 (L) 3.5 - 5.0 g/dL   AST 28 15 - 41 U/L   ALT 14 14 - 54 U/L   Alkaline Phosphatase 69 38 - 126 U/L   Total Bilirubin 0.5 0.3 - 1.2 mg/dL   GFR calc non Af Amer 60 (L) >60 mL/min   GFR calc Af Amer >60 >60 mL/min    Comment: (NOTE) The eGFR has been calculated using the  CKD EPI equation. This calculation has not been validated in all clinical situations. eGFR's persistently <60 mL/min signify possible Chronic Kidney Disease.    Anion gap 10 5 - 15  Ethanol (ETOH)     Status: None   Collection Time: 08/11/15 12:56 AM  Result Value Ref Range   Alcohol, Ethyl (B) <5 <5 mg/dL    Comment:        LOWEST DETECTABLE LIMIT FOR SERUM ALCOHOL IS 5 mg/dL FOR MEDICAL PURPOSES ONLY   Salicylate level     Status: None   Collection Time: 08/11/15 12:56 AM  Result Value Ref Range   Salicylate Lvl <3.2 2.8 - 30.0 mg/dL  Acetaminophen level     Status: Abnormal   Collection Time: 08/11/15 12:56 AM  Result Value Ref Range   Acetaminophen (Tylenol), Serum <10 (L) 10 - 30 ug/mL    Comment:        THERAPEUTIC CONCENTRATIONS VARY SIGNIFICANTLY. A RANGE OF 10-30 ug/mL MAY BE AN EFFECTIVE CONCENTRATION FOR MANY PATIENTS. HOWEVER, SOME ARE BEST TREATED AT CONCENTRATIONS OUTSIDE THIS RANGE. ACETAMINOPHEN CONCENTRATIONS >150 ug/mL AT 4 HOURS AFTER INGESTION AND >50 ug/mL AT 12 HOURS AFTER INGESTION ARE OFTEN ASSOCIATED WITH TOXIC REACTIONS.   CBC     Status: Abnormal   Collection Time: 08/11/15 12:56 AM  Result Value Ref Range   WBC 10.9 (H) 4.0 - 10.5 K/uL   RBC 4.94 3.87 - 5.11 MIL/uL   Hemoglobin 14.1 12.0 - 15.0 g/dL   HCT 43.1 36.0 - 46.0 %   MCV 87.2 78.0 - 100.0 fL   MCH 28.5 26.0 - 34.0 pg   MCHC 32.7 30.0 - 36.0 g/dL   RDW 16.2 (H) 11.5 - 15.5 %   Platelets 263 150 - 400 K/uL  Urine rapid drug screen (hosp performed) (Not at [facility])     Status: Abnormal   Collection Time: 08/11/15  1:51 AM  Result Value Ref Range   Opiates POSITIVE (A) NONE DETECTED   Cocaine NONE DETECTED NONE DETECTED   Benzodiazepines POSITIVE (A) NONE DETECTED   Amphetamines NONE DETECTED NONE DETECTED   Tetrahydrocannabinol NONE DETECTED NONE DETECTED   Barbiturates NONE DETECTED NONE DETECTED    Comment:        DRUG SCREEN FOR MEDICAL PURPOSES ONLY.  IF CONFIRMATION IS  NEEDED FOR ANY PURPOSE, NOTIFY LAB WITHIN 5 DAYS.        LOWEST DETECTABLE LIMITS FOR URINE DRUG SCREEN Drug Class       Cutoff (ng/mL) Amphetamine      1000 Barbiturate      200 Benzodiazepine   549 Tricyclics       826 Opiates          300  Cocaine          300 THC              50   Urinalysis, Routine w reflex microscopic (not at [facility])     Status: Abnormal   Collection Time: 08/11/15  1:52 AM  Result Value Ref Range   Color, Urine AMBER (A) YELLOW    Comment: BIOCHEMICALS MAY BE AFFECTED BY COLOR   APPearance CLOUDY (A) CLEAR   Specific Gravity, Urine 1.024 1.005 - 1.030   pH 6.0 5.0 - 8.0   Glucose, UA NEGATIVE NEGATIVE mg/dL   Hgb urine dipstick TRACE (A) NEGATIVE   Bilirubin Urine SMALL (A) NEGATIVE   Ketones, ur NEGATIVE NEGATIVE mg/dL   Protein, ur NEGATIVE NEGATIVE mg/dL   Urobilinogen, UA 1.0 0.0 - 1.0 mg/dL   Nitrite NEGATIVE NEGATIVE   Leukocytes, UA NEGATIVE NEGATIVE  Urine microscopic-add on     Status: Abnormal   Collection Time: 08/11/15  1:52 AM  Result Value Ref Range   Squamous Epithelial / LPF MANY (A) RARE   RBC / HPF 0-2 <3 RBC/hpf   Bacteria, UA FEW (A) RARE  CBC     Status: Abnormal   Collection Time: 08/11/15  8:55 AM  Result Value Ref Range   WBC 6.2 4.0 - 10.5 K/uL   RBC 4.09 3.87 - 5.11 MIL/uL   Hemoglobin 11.9 (L) 12.0 - 15.0 g/dL   HCT 35.4 (L) 36.0 - 46.0 %   MCV 86.6 78.0 - 100.0 fL   MCH 29.1 26.0 - 34.0 pg   MCHC 33.6 30.0 - 36.0 g/dL   RDW 16.4 (H) 11.5 - 15.5 %   Platelets 189 150 - 400 K/uL  Creatinine, serum     Status: None   Collection Time: 08/11/15  8:55 AM  Result Value Ref Range   Creatinine, Ser 0.82 0.44 - 1.00 mg/dL   GFR calc non Af Amer >60 >60 mL/min   GFR calc Af Amer >60 >60 mL/min    Comment: (NOTE) The eGFR has been calculated using the CKD EPI equation. This calculation has not been validated in all clinical situations. eGFR's persistently <60 mL/min signify possible Chronic Kidney Disease.   TSH      Status: None   Collection Time: 08/11/15  8:55 AM  Result Value Ref Range   TSH 1.065 0.350 - 4.500 uIU/mL    Vitals: Blood pressure 118/76, pulse 84, temperature 98.3 F (36.8 C), temperature source Oral, resp. rate 20, height 5' 6" (1.676 m), weight 71.305 kg (157 lb 3.2 oz), SpO2 99 %.  Risk to Self: Is patient at risk for suicide?: No, but patient needs Medical Clearance Risk to Others:   Prior Inpatient Therapy:   Prior Outpatient Therapy:    Current Facility-Administered Medications  Medication Dose Route Frequency Provider Last Rate Last Dose  . 0.9 %  sodium chloride infusion   Intravenous Continuous Kelvin Cellar, MD 75 mL/hr at 08/11/15 [phone]    . acetaminophen (TYLENOL) tablet 650 mg  650 mg Oral Q6H PRN Kelvin Cellar, MD       Or  . acetaminophen (TYLENOL) suppository 650 mg  650 mg Rectal Q6H PRN Kelvin Cellar, MD      . albuterol (PROVENTIL) (2.5 MG/3ML) 0.083% nebulizer solution 3 mL  3 mL Inhalation Q4H PRN Kelvin Cellar, MD      . ALPRAZolam Duanne Moron) tablet 1 mg  1 mg Oral BID Kelvin Cellar, MD   1 mg at 08/11/15 0919  .  alum & mag hydroxide-simeth (MAALOX/MYLANTA) 200-200-20 MG/5ML suspension 30 mL  30 mL Oral Q6H PRN Kelvin Cellar, MD      . Derrill Memo ON 08/12/2015] azithromycin (ZITHROMAX) 500 mg in dextrose 5 % 250 mL IVPB  500 mg Intravenous Q24H Kelvin Cellar, MD      . Derrill Memo ON 08/12/2015] cefTRIAXone (ROCEPHIN) 1 g in dextrose 5 % 50 mL IVPB  1 g Intravenous Q24H Kelvin Cellar, MD      . cloNIDine (CATAPRES) tablet 0.1 mg  0.1 mg Oral QID Kelvin Cellar, MD   0.1 mg at 08/11/15 4403   Followed by  . [START ON 08/13/2015] cloNIDine (CATAPRES) tablet 0.1 mg  0.1 mg Oral BH-qamhs Kelvin Cellar, MD       Followed by  . [START ON 08/15/2015] cloNIDine (CATAPRES) tablet 0.1 mg  0.1 mg Oral QAC breakfast Kelvin Cellar, MD      . dicyclomine (BENTYL) tablet 20 mg  20 mg Oral Q6H PRN Kelvin Cellar, MD      . enoxaparin (LOVENOX) injection 40 mg  40 mg  Subcutaneous Q24H Kelvin Cellar, MD      . estrogens (conjugated) (PREMARIN) tablet 0.625 mg  0.625 mg Oral Daily Royetta Asal, RPH   0.625 mg at 08/11/15 4742   And  . medroxyPROGESTERone (PROVERA) tablet 2.5 mg  2.5 mg Oral Daily Royetta Asal, RPH   2.5 mg at 08/11/15 5956  . haloperidol (HALDOL) tablet 2 mg  2 mg Oral Daily Kelvin Cellar, MD   2 mg at 08/11/15 3875  . hydrOXYzine (ATARAX/VISTARIL) tablet 25 mg  25 mg Oral Q6H PRN Kelvin Cellar, MD      . loperamide (IMODIUM) capsule 2-4 mg  2-4 mg Oral PRN Kelvin Cellar, MD      . loratadine (CLARITIN) tablet 10 mg  10 mg Oral Daily Royetta Asal, RPH   10 mg at 08/11/15 6433  . mesalamine (LIALDA) EC tablet 1.2 g  1.2 g Oral BID Kelvin Cellar, MD   1.2 g at 08/11/15 0923  . methocarbamol (ROBAXIN) tablet 500 mg  500 mg Oral Q8H PRN Kelvin Cellar, MD      . methylphenidate (RITALIN) tablet 20 mg  20 mg Oral TID Kelvin Cellar, MD   20 mg at 08/11/15 2951  . OLANZapine (ZYPREXA) tablet 10 mg  10 mg Oral Daily Kelvin Cellar, MD   10 mg at 08/11/15 0923  . ondansetron (ZOFRAN-ODT) disintegrating tablet 4 mg  4 mg Oral Q6H PRN Kelvin Cellar, MD      . pantoprazole (PROTONIX) EC tablet 40 mg  40 mg Oral Daily Kelvin Cellar, MD   40 mg at 08/11/15 0921  . predniSONE (DELTASONE) tablet 20 mg  20 mg Oral Daily Kelvin Cellar, MD   20 mg at 08/11/15 0923  . sodium chloride 0.9 % injection 3 mL  3 mL Intravenous Q12H Kelvin Cellar, MD      . traZODone (DESYREL) tablet 100 mg  100 mg Oral BID Kelvin Cellar, MD   100 mg at 08/11/15 8841    Musculoskeletal: Strength & Muscle Tone: decreased Gait & Station: unable to stand Patient leans: N/A  Psychiatric Specialty Exam: Physical Exam as per history and physical  ROS auditory/visual hallucinations paranoia, depression anxiety and opiate dependency No Fever-chills, No Headache, No changes with Vision or hearing, reports vertigo No problems swallowing food or  Liquids, No Chest pain, Cough or Shortness of Breath, No Abdominal pain, No Nausea or Vommitting, Bowel movements are regular, No Blood  in stool or Urine, No dysuria, No new skin rashes or bruises, No new joints pains-aches,  No new weakness, tingling, numbness in any extremity, No recent weight gain or loss, No polyuria, polydypsia or polyphagia,   A full 10 point Review of Systems was done, except as stated above, all other Review of Systems were negative.  Blood pressure 118/76, pulse 84, temperature 98.3 F (36.8 C), temperature source Oral, resp. rate 20, height 5' 6" (1.676 m), weight 71.305 kg (157 lb 3.2 oz), SpO2 99 %.Body mass index is 25.38 kg/(m^2).  General Appearance: Guarded  Eye Contact::  Good  Speech:  Slow  Volume:  Decreased  Mood:  Anxious and Depressed  Affect:  Constricted and Depressed  Thought Process:  Coherent and Goal Directed  Orientation:  Full (Time, Place, and Person)  Thought Content:  Delusions, Hallucinations: Auditory Visual and Paranoid Ideation  Suicidal Thoughts:  No  Homicidal Thoughts:  No  Memory:  Immediate;   Fair Recent;   Fair  Judgement:  Intact  Insight:  Fair  Psychomotor Activity:  Decreased  Concentration:  Fair  Recall:  Good  Fund of Knowledge:Good  Language: Good  Akathisia:  Negative  Handed:  Right  AIMS (if indicated):     Assets:  Communication Skills Desire for Improvement Financial Resources/Insurance Housing Leisure Time Resilience Social Support  ADL's:  Impaired  Cognition: WNL  Sleep:      Medical Decision Making: Review or order clinical lab tests (1), Discuss test with performing physician (1), Established Problem, Worsening (2), New Problem, with no additional work-up planned (3), Review of Last Therapy Session (1), Review or order medicine tests (1), Review of Medication Regimen & Side Effects (2) and Review of New Medication or Change in Dosage (2)  Treatment Plan Summary: Patient has a chronic  psychiatric conditions like schizophrenia, ADHD, depression and anxiety presented with symptoms of pneumonia and asking detox for the opiates. Patient has no suicidal/homicidal ideation, intention or plans.  Daily contact with patient to assess and evaluate symptoms and progress in treatment and Medication management  Plan:  Refer to the psychiatric social service for collateral information Safety concerns: Patient has no safety concerns Clonidine protocol for opiate detox treatment Continue alprazolam 1 mg twice daily for anxiety,  Haldol 2 mg daily and Zyprexa 10 mg daily for psychosis,  Trazodone 100 mg twice daily for mood and insomnia  Methylphenidate 20 mg 3 times daily for ADHD Monitor for extrapyramidal symptoms and other side effects Patient does not meet criteria for psychiatric inpatient admission. Supportive therapy provided about ongoing stressors.  Appreciate psychiatric consultation and follow-up as needed  Please contact [phone] or [phone] if needs further assistance   Disposition: Patient may be discharged home when medically stable and follow up with outpatient psychiatric medication management.  ,JANARDHAHA R. 08/11/2015 10:44 AM

## 2015-08-11 NOTE — H&P (Signed)
[facility] History and Physical  Kahlan Engebretson ZOX:[id number] DOB: [birth year] DOA: 08/10/2015  Referring physician:  PCP: No primary care provider on file.   Chief Complaint: Cough/SOB  HPI: Nashly Olsson is a 54 y.o. female with a past medical history of schizophrenia and Crohn's disease who initially presented to the emergency department requesting to be detoxed from narcotics. She also reported having a two-week history of cough associated with sputum production and shortness of breath. She complained of associated fevers chills fatigue as well as sharp stabbing chest pain with deep inspiration and cough. Two-view chest x-ray performed in the emergency department revealed mid left lower lobe pneumonia. Urine drug screen, back positive for opiates and benzodiazepine's. Patient also reporting having visual and auditory hallucinations over the past week or so. She currently denies suicidal or homicidal ideations and is not presently hallucinating. In the emergency department she was given a dose of ceftriaxone and azithromycin.                                                          Review of Systems:  Constitutional:  No weight loss, night sweats, positive for Fevers, chills, fatigue.  HEENT:  No headaches, Difficulty swallowing,Tooth/dental problems,Sore throat,  No sneezing, itching, ear ache, nasal congestion, post nasal drip,  Cardio-vascular:  Positive for chest pain, Orthopnea, PND, swelling in lower extremities, anasarca, dizziness, palpitations  GI:  No heartburn, indigestion, abdominal pain, nausea, vomiting, diarrhea, change in bowel habits, loss of appetite  Resp:  Positive for shortness of breath with exertion or at rest. No excess mucus, no productive cough, No non-productive cough, No coughing up of blood.No change in color of mucus.No wheezing.No chest wall deformity  Skin:  no rash or lesions.  GU:  no dysuria, change in color of urine, no urgency or frequency. No flank  pain.  Musculoskeletal:  No joint pain or swelling. No decreased range of motion. No back pain.  Psych:  No change in mood or affect. No depression or anxiety. No memory loss.   Past Medical History  Diagnosis Date  . Crohn's disease   . Chronic back pain    Past Surgical History  Procedure Laterality Date  . Cholecystectomy    . Abdominal hysterectomy    . Bowel resection     Social History:  reports that she has been smoking Cigarettes.  She does not have any smokeless tobacco history on file. She reports that she does not drink alcohol or use illicit drugs.  Allergies  Allergen Reactions  . Aspirin Other (See Comments)    MD told not to take due to crohns    Family History  Problem Relation Age of Onset  . Hypertension Mother     Prior to Admission medications   Medication Sig Start Date End Date Taking? Authorizing Provider  AMITIZA 24 MCG capsule Take 1 capsule by mouth 2 (two) times daily as needed. 07/13/15  Yes Historical Provider, MD  haloperidol (HALDOL) 2 MG tablet Take 1 tablet by mouth daily. 06/16/15  Yes Historical Provider, MD  HYDROmorphone (DILAUDID) 4 MG tablet Take 1 tablet by mouth 4 (four) times daily. 07/15/15  Yes Historical Provider, MD  levocetirizine (XYZAL) 5 MG tablet Take 1 tablet by mouth daily. 07/13/15  Yes Historical Provider, MD  LIALDA 1.2 G EC tablet  Take 1 tablet by mouth 2 (two) times daily. 07/14/15  Yes Historical Provider, MD  LIDODERM 5 % Place 1 patch onto the skin 2 (two) times daily. 07/13/15  Yes Historical Provider, MD  methylphenidate (RITALIN) 20 MG tablet Take 1 tablet by mouth 3 (three) times daily. 07/20/15  Yes Historical Provider, MD  NEXIUM 40 MG capsule Take 1 capsule by mouth 2 (two) times daily. 07/13/15  Yes Historical Provider, MD  OLANZapine (ZYPREXA) 10 MG tablet Take 1 tablet by mouth daily. 07/13/15  Yes Historical Provider, MD  OLANZapine (ZYPREXA) 20 MG tablet Take 1 tablet by mouth daily. 07/13/15  Yes Historical  Provider, MD  OPANA ER, CRUSH RESISTANT, 40 MG T12A Take 1 tablet by mouth 2 (two) times daily. 07/15/15  Yes Historical Provider, MD  oxycodone (ROXICODONE) 30 MG immediate release tablet Take 1 tablet by mouth 4 (four) times daily. 07/15/15  Yes Historical Provider, MD  potassium chloride SA (K-DUR,KLOR-CON) 20 MEQ tablet Take 1 tablet by mouth daily. 07/13/15  Yes Historical Provider, MD  predniSONE (DELTASONE) 20 MG tablet Take 1 tablet by mouth daily. 07/01/15  Yes Historical Provider, MD  PREMPRO 0.625-2.5 MG per tablet Take 1 tablet by mouth daily. 08/09/15  Yes Historical Provider, MD  PROAIR HFA 108 (90 BASE) MCG/ACT inhaler Inhale 1 puff into the lungs every 4 (four) hours as needed. 07/13/15  Yes Historical Provider, MD  traZODone (DESYREL) 100 MG tablet Take 1 tablet by mouth 2 (two) times daily. 07/13/15  Yes Historical Provider, MD  Prudy Feeler 1 MG tablet Take 1 tablet by mouth 4 (four) times daily. 07/14/15  Yes Historical Provider, MD   Physical Exam: Filed Vitals:   08/10/15 2214 08/11/15 0328 08/11/15 0728  BP: 142/92 122/83 107/81  Pulse: 133 103 82  Temp: 100.3 F (37.9 C) 99.5 F (37.5 C) 98 F (36.7 C)  TempSrc: Oral Oral Oral  Resp: 20 22 20   SpO2: 95% 93% 96%    Wt Readings from Last 3 Encounters:  No data found for Wt    General:  Appears calm, sedated however is arousable to voice and can provide history Eyes: PERRL, normal lids, irises & conjunctiva ENT: grossly normal hearing, lips & tongue Neck: no LAD, masses or thyromegaly Cardiovascular: RRR, no m/r/g. No LE edema. Telemetry: SR, no arrhythmias  Respiratory: CTA bilaterally, no w/r/r. Normal respiratory effort. Abdomen: soft, ntnd Skin: no rash or induration seen on limited exam Musculoskeletal: grossly normal tone BUE/BLE Psychiatric: grossly normal mood and affect, speech fluent and appropriate, patient is sedated though is arousable Neurologic: grossly non-focal.          Labs on Admission:  Basic  Metabolic Panel:  Recent Labs Lab 08/11/15 0056  NA 134*  K 4.5  CL 98*  CO2 26  GLUCOSE 107*  BUN 15  CREATININE 1.03*  CALCIUM 8.6*   Liver Function Tests:  Recent Labs Lab 08/11/15 0056  AST 28  ALT 14  ALKPHOS 69  BILITOT 0.5  PROT 6.7  ALBUMIN 2.7*   No results for input(s): LIPASE, AMYLASE in the last 168 hours. No results for input(s): AMMONIA in the last 168 hours. CBC:  Recent Labs Lab 08/11/15 0056  WBC 10.9*  HGB 14.1  HCT 43.1  MCV 87.2  PLT 263   Cardiac Enzymes: No results for input(s): CKTOTAL, CKMB, CKMBINDEX, TROPONINI in the last 168 hours.  BNP (last 3 results) No results for input(s): BNP in the last 8760 hours.  ProBNP (last 3  results) No results for input(s): PROBNP in the last 8760 hours.  CBG: No results for input(s): GLUCAP in the last 168 hours.  Radiological Exams on Admission: Dg Chest 2 View  08/11/2015   CLINICAL DATA:  Acute onset of shortness of breath. Left anterior rib pain. Initial encounter.  EXAM: CHEST  2 VIEW  COMPARISON:  Chest radiograph performed 08/10/2015  FINDINGS: The lungs are well-aerated. Mild left lower lobe pneumonia is noted. There is no evidence of pleural effusion or pneumothorax.  The heart is normal in size; the mediastinal contour is within normal limits. No acute osseous abnormalities are seen.  IMPRESSION: Mild left lower lobe pneumonia noted.   Electronically Signed   By: Roanna Raider M.D.   On: 08/11/2015 04:06   Dg Ribs Unilateral W/chest Left  08/11/2015   CLINICAL DATA:  Larey Seat at home August 08, 2015, LEFT rib pain. Increasing shortness of breath.  EXAM: LEFT RIBS AND CHEST - 3+ VIEW  COMPARISON:  None.  FINDINGS: No fracture or other bone lesions are seen involving the ribs. There is no evidence of pneumothorax or pleural effusion. Patchy LEFT lower lobe airspace opacity, to lesser extent on the RIGHT. Heart size and mediastinal contours are within normal limits. Surgical clips in the included  right abdomen compatible with cholecystectomy.  IMPRESSION: No acute rib fracture deformity.  Patchy bibasilar airspace opacities, nonspecific and can be seen with pectus excavatum though, would be better characterized and PA and lateral views of the chest when clinically able.   Electronically Signed   By: Awilda Metro M.D.   On: 08/11/2015 00:29   Ct Head Wo Contrast  08/11/2015   CLINICAL DATA:  Acute onset of visual and auditory hallucinations. Initial encounter.  EXAM: CT HEAD WITHOUT CONTRAST  TECHNIQUE: Contiguous axial images were obtained from the base of the skull through the vertex without intravenous contrast.  COMPARISON:  None.  FINDINGS: There is no evidence of acute infarction, mass lesion, or intra- or extra-axial hemorrhage on CT.  Minimal subcortical white matter change likely reflects small vessel ischemic microangiopathy.  The posterior fossa, including the cerebellum, brainstem and fourth ventricle, is within normal limits. The third and lateral ventricles, and basal ganglia are unremarkable in appearance. The cerebral hemispheres are symmetric in appearance, with normal gray-white differentiation. No mass effect or midline shift is seen.  There is no evidence of fracture; visualized osseous structures are unremarkable in appearance. The orbits are within normal limits. The paranasal sinuses and mastoid air cells are well-aerated. No significant soft tissue abnormalities are seen.  IMPRESSION: 1. No acute intracranial pathology seen on CT. 2. Minimal small vessel ischemic microangiopathy.   Electronically Signed   By: Roanna Raider M.D.   On: 08/11/2015 01:55    EKG: Independently reviewed.   Assessment/Plan Active Problems:   Schizophrenia   CAP (community acquired pneumonia)   Crohn's disease   PNA (pneumonia)   1. Community acquire pneumonia. Patient presenting with complaints of shortness of breath associate with cough fevers, chills, fatigue. A two-view chest x-ray  performed in the emergency department revealed the presence of a left lower lobe pneumonia. Will start empiric IV antimicrobial therapy with ceftriaxone and azithromycin for probable community acquired pneumonia. 2 sets of blood cultures have already been obtained in the emergency department. Will admit patient to telemetry provide supportive care and repeat chest x-ray in a.m.  2. Schizophrenia. Pa she does not have wished tient with history of schizophrenia reporting visual and auditory hallucinations over  the week. She denies having hallucinations in the emergency department. She does not have wishes to hurt herself stating, "I love my children too much to do that." We'll continue haloperidol 2 mg by mouth daily and olanzapine 10 mg by mouth daily. Psychiatry consultation has been placed. 3. Narcotic detox. She expresses her wishes to detox from narcotics. She will be placed on opiate withdrawal protocol with clonidine. 4. History of Crohn's disease. Continue prednisone 20 mg by mouth daily 5. DVT prophylaxis. Lovenox   Code Status: Full code Family Communication:  Disposition Plan: Anticipate patient will require greater than 2 nights hospitalization  Time spent: 65 min  Jeralyn Bennett [facility] Pager [phone]

## 2015-08-12 ENCOUNTER — Inpatient Hospital Stay (HOSPITAL_COMMUNITY): Payer: Medicare Other

## 2015-08-12 DIAGNOSIS — F1110 Opioid abuse, uncomplicated: Secondary | ICD-10-CM

## 2015-08-12 DIAGNOSIS — F1910 Other psychoactive substance abuse, uncomplicated: Secondary | ICD-10-CM

## 2015-08-12 DIAGNOSIS — J189 Pneumonia, unspecified organism: Principal | ICD-10-CM

## 2015-08-12 DIAGNOSIS — F29 Unspecified psychosis not due to a substance or known physiological condition: Secondary | ICD-10-CM

## 2015-08-12 DIAGNOSIS — K5090 Crohn's disease, unspecified, without complications: Secondary | ICD-10-CM

## 2015-08-12 DIAGNOSIS — F209 Schizophrenia, unspecified: Secondary | ICD-10-CM

## 2015-08-12 MED ORDER — KETOROLAC TROMETHAMINE 30 MG/ML IJ SOLN
30.0000 mg | Freq: Four times a day (QID) | INTRAMUSCULAR | Status: DC | PRN
  Administered 2015-08-12 – 2015-08-13 (×3): 30 mg via INTRAVENOUS
  Filled 2015-08-12 (×2): qty 1

## 2015-08-12 MED ORDER — DEXTROMETHORPHAN POLISTIREX ER 30 MG/5ML PO SUER
30.0000 mg | Freq: Two times a day (BID) | ORAL | Status: DC
  Administered 2015-08-12 – 2015-08-15 (×6): 30 mg via ORAL
  Filled 2015-08-12 (×7): qty 5

## 2015-08-12 MED ORDER — HALOPERIDOL 2 MG PO TABS
2.0000 mg | ORAL_TABLET | Freq: Two times a day (BID) | ORAL | Status: DC
  Administered 2015-08-12 – 2015-08-15 (×6): 2 mg via ORAL
  Filled 2015-08-12 (×7): qty 1

## 2015-08-12 MED ORDER — GUAIFENESIN ER 600 MG PO TB12
600.0000 mg | ORAL_TABLET | Freq: Two times a day (BID) | ORAL | Status: DC
  Administered 2015-08-12 – 2015-08-15 (×6): 600 mg via ORAL
  Filled 2015-08-12 (×10): qty 1

## 2015-08-12 NOTE — Consult Note (Addendum)
[facility] Face-to-Face Psychiatry Consult follow-up  Reason for Consult:  Psychotic hallucination and opioid dependence Referring Physician:  Dr. Alcario Drought Patient Identification: Nicole Fitzgerald MRN:  [id number] Principal Diagnosis: CAP (community acquired pneumonia) Diagnosis:   Patient Active Problem List   Diagnosis Date Noted  . Polysubstance abuse [F19.10] 08/12/2015  . Schizophrenia [F20.9] 08/11/2015  . CAP (community acquired pneumonia) [J18.9] 08/11/2015  . Crohn's disease [K50.90] 08/11/2015    Total Time spent with patient: 30 minutes  Subjective:   Nicole Fitzgerald is a 54 y.o. female patient admitted with asking opioid detox.   HPI:  Nicole Fitzgerald is a 54 y.o. female seen for face-to-face psychiatry consultation and evaluation of hallucinations and detox treatment for opiates. Patient reported that she was initially came to the hospital with community-acquired pneumonia and then seeking detox treatment for opiates. Patient reportedly suffering with chronic mental illness especially psychosis, anxiety, ADHD and has been receiving outpatient medication management from [facility] in [place]. Patient reportedly was seen Dr. Claiborne Billings and Dr. Andria Rhein in the past 25 years. Patient also reportedly she has been placed on disability secondary to having a severe symptoms of auditory/visual hallucinations and paranoia. Patient has been taking multiple psychiatric medication. She has been taking pain medication for chronic stasis. Patient urine drug screen, back positive for opiates and benzodiazepine's probably due to her description medication. Patient has  visual and auditory hallucinations over the past week. She currently denies suicidal or homicidal ideations, Intention or plans. Patient has no recent acute psychiatric hospitalizations. Patient has been staying with her son and mostly staying herself in the home and has limited outings.  Interval history: patient continue to report auditory and  visual hallucination. She continue to report symptoms of depression and anxiety. Patient sister and brother in law visiting her and at bed side during this visit. Patient sister stated that she can go and stay with her when medically and emotionally stable. She has few symptoms of opioid withdrawal symptoms. She has no suicide or homicide ideations. She agree with the medication changes. She should stay away form stimulant medication which may cause or exacerbate hallucinations.   Past Medical History:  Past Medical History  Diagnosis Date  . Crohn's disease   . Chronic back pain     Past Surgical History  Procedure Laterality Date  . Cholecystectomy    . Abdominal hysterectomy    . Bowel resection     Family History:  Family History  Problem Relation Age of Onset  . Hypertension Mother    Social History:  History  Alcohol Use No     History  Drug Use No    Social History   Social History  . Marital Status: Legally Separated    Spouse Name: N/A  . Number of Children: N/A  . Years of Education: N/A   Social History Main Topics  . Smoking status: Current Every Day Smoker    Types: Cigarettes  . Smokeless tobacco: None  . Alcohol Use: No  . Drug Use: No  . Sexual Activity: Not Asked   Other Topics Concern  . None   Social History Narrative  . None   Additional Social History:                          Allergies:   Allergies  Allergen Reactions  . Aspirin Other (See Comments)    MD told not to take due to crohns    Labs:  Results  for orders placed or performed during the hospital encounter of 08/10/15 (from the past 48 hour(s))  Comprehensive metabolic panel     Status: Abnormal   Collection Time: 08/11/15 12:56 AM  Result Value Ref Range   Sodium 134 (L) 135 - 145 mmol/L   Potassium 4.5 3.5 - 5.1 mmol/L   Chloride 98 (L) 101 - 111 mmol/L   CO2 26 22 - 32 mmol/L   Glucose, Bld 107 (H) 65 - 99 mg/dL   BUN 15 6 - 20 mg/dL   Creatinine, Ser 1.03  (H) 0.44 - 1.00 mg/dL   Calcium 8.6 (L) 8.9 - 10.3 mg/dL   Total Protein 6.7 6.5 - 8.1 g/dL   Albumin 2.7 (L) 3.5 - 5.0 g/dL   AST 28 15 - 41 U/L   ALT 14 14 - 54 U/L   Alkaline Phosphatase 69 38 - 126 U/L   Total Bilirubin 0.5 0.3 - 1.2 mg/dL   GFR calc non Af Amer 60 (L) >60 mL/min   GFR calc Af Amer >60 >60 mL/min    Comment: (NOTE) The eGFR has been calculated using the CKD EPI equation. This calculation has not been validated in all clinical situations. eGFR's persistently <60 mL/min signify possible Chronic Kidney Disease.    Anion gap 10 5 - 15  Ethanol (ETOH)     Status: None   Collection Time: 08/11/15 12:56 AM  Result Value Ref Range   Alcohol, Ethyl (B) <5 <5 mg/dL    Comment:        LOWEST DETECTABLE LIMIT FOR SERUM ALCOHOL IS 5 mg/dL FOR MEDICAL PURPOSES ONLY   Salicylate level     Status: None   Collection Time: 08/11/15 12:56 AM  Result Value Ref Range   Salicylate Lvl <8.9 2.8 - 30.0 mg/dL  Acetaminophen level     Status: Abnormal   Collection Time: 08/11/15 12:56 AM  Result Value Ref Range   Acetaminophen (Tylenol), Serum <10 (L) 10 - 30 ug/mL    Comment:        THERAPEUTIC CONCENTRATIONS VARY SIGNIFICANTLY. A RANGE OF 10-30 ug/mL MAY BE AN EFFECTIVE CONCENTRATION FOR MANY PATIENTS. HOWEVER, SOME ARE BEST TREATED AT CONCENTRATIONS OUTSIDE THIS RANGE. ACETAMINOPHEN CONCENTRATIONS >150 ug/mL AT 4 HOURS AFTER INGESTION AND >50 ug/mL AT 12 HOURS AFTER INGESTION ARE OFTEN ASSOCIATED WITH TOXIC REACTIONS.   CBC     Status: Abnormal   Collection Time: 08/11/15 12:56 AM  Result Value Ref Range   WBC 10.9 (H) 4.0 - 10.5 K/uL   RBC 4.94 3.87 - 5.11 MIL/uL   Hemoglobin 14.1 12.0 - 15.0 g/dL   HCT 43.1 36.0 - 46.0 %   MCV 87.2 78.0 - 100.0 fL   MCH 28.5 26.0 - 34.0 pg   MCHC 32.7 30.0 - 36.0 g/dL   RDW 16.2 (H) 11.5 - 15.5 %   Platelets 263 150 - 400 K/uL  Urine rapid drug screen (hosp performed) (Not at [facility])     Status: Abnormal   Collection Time:  08/11/15  1:51 AM  Result Value Ref Range   Opiates POSITIVE (A) NONE DETECTED   Cocaine NONE DETECTED NONE DETECTED   Benzodiazepines POSITIVE (A) NONE DETECTED   Amphetamines NONE DETECTED NONE DETECTED   Tetrahydrocannabinol NONE DETECTED NONE DETECTED   Barbiturates NONE DETECTED NONE DETECTED    Comment:        DRUG SCREEN FOR MEDICAL PURPOSES ONLY.  IF CONFIRMATION IS NEEDED FOR ANY PURPOSE, NOTIFY LAB WITHIN 5 DAYS.  LOWEST DETECTABLE LIMITS FOR URINE DRUG SCREEN Drug Class       Cutoff (ng/mL) Amphetamine      1000 Barbiturate      200 Benzodiazepine   712 Tricyclics       197 Opiates          300 Cocaine          300 THC              50   Urine culture     Status: None (Preliminary result)   Collection Time: 08/11/15  1:51 AM  Result Value Ref Range   Specimen Description URINE, CLEAN CATCH    Special Requests Normal    Culture      CULTURE REINCUBATED FOR BETTER GROWTH Performed at [facility]    Report Status PENDING   Urinalysis, Routine w reflex microscopic (not at [facility])     Status: Abnormal   Collection Time: 08/11/15  1:52 AM  Result Value Ref Range   Color, Urine AMBER (A) YELLOW    Comment: BIOCHEMICALS MAY BE AFFECTED BY COLOR   APPearance CLOUDY (A) CLEAR   Specific Gravity, Urine 1.024 1.005 - 1.030   pH 6.0 5.0 - 8.0   Glucose, UA NEGATIVE NEGATIVE mg/dL   Hgb urine dipstick TRACE (A) NEGATIVE   Bilirubin Urine SMALL (A) NEGATIVE   Ketones, ur NEGATIVE NEGATIVE mg/dL   Protein, ur NEGATIVE NEGATIVE mg/dL   Urobilinogen, UA 1.0 0.0 - 1.0 mg/dL   Nitrite NEGATIVE NEGATIVE   Leukocytes, UA NEGATIVE NEGATIVE  Urine microscopic-add on     Status: Abnormal   Collection Time: 08/11/15  1:52 AM  Result Value Ref Range   Squamous Epithelial / LPF MANY (A) RARE   RBC / HPF 0-2 <3 RBC/hpf   Bacteria, UA FEW (A) RARE  CBC     Status: Abnormal   Collection Time: 08/11/15  8:55 AM  Result Value Ref Range   WBC 6.2 4.0 - 10.5 K/uL   RBC  4.09 3.87 - 5.11 MIL/uL   Hemoglobin 11.9 (L) 12.0 - 15.0 g/dL   HCT 35.4 (L) 36.0 - 46.0 %   MCV 86.6 78.0 - 100.0 fL   MCH 29.1 26.0 - 34.0 pg   MCHC 33.6 30.0 - 36.0 g/dL   RDW 16.4 (H) 11.5 - 15.5 %   Platelets 189 150 - 400 K/uL  Creatinine, serum     Status: None   Collection Time: 08/11/15  8:55 AM  Result Value Ref Range   Creatinine, Ser 0.82 0.44 - 1.00 mg/dL   GFR calc non Af Amer >60 >60 mL/min   GFR calc Af Amer >60 >60 mL/min    Comment: (NOTE) The eGFR has been calculated using the CKD EPI equation. This calculation has not been validated in all clinical situations. eGFR's persistently <60 mL/min signify possible Chronic Kidney Disease.   TSH     Status: None   Collection Time: 08/11/15  8:55 AM  Result Value Ref Range   TSH 1.065 0.350 - 4.500 uIU/mL    Vitals: Blood pressure 157/95, pulse 88, temperature 97.8 F (36.6 C), temperature source Oral, resp. rate 20, height '5\' 6"'  (1.676 m), weight 71.305 kg (157 lb 3.2 oz), SpO2 100 %.  Risk to Self: Is patient at risk for suicide?: No, but patient needs Medical Clearance Risk to Others:   Prior Inpatient Therapy:   Prior Outpatient Therapy:    Current Facility-Administered Medications  Medication Dose Route Frequency  Provider Last Rate Last Dose  . 0.9 %  sodium chloride infusion   Intravenous Continuous Kelvin Cellar, MD 75 mL/hr at 08/11/15 [phone]    . acetaminophen (TYLENOL) tablet 650 mg  650 mg Oral Q6H PRN Kelvin Cellar, MD       Or  . acetaminophen (TYLENOL) suppository 650 mg  650 mg Rectal Q6H PRN Kelvin Cellar, MD      . albuterol (PROVENTIL) (2.5 MG/3ML) 0.083% nebulizer solution 3 mL  3 mL Inhalation Q4H PRN Kelvin Cellar, MD      . ALPRAZolam Duanne Moron) tablet 1 mg  1 mg Oral BID Kelvin Cellar, MD   1 mg at 08/12/15 0956  . alum & mag hydroxide-simeth (MAALOX/MYLANTA) 200-200-20 MG/5ML suspension 30 mL  30 mL Oral Q6H PRN Kelvin Cellar, MD      . azithromycin (ZITHROMAX) 500 mg in dextrose 5  % 250 mL IVPB  500 mg Intravenous Q24H Kelvin Cellar, MD   500 mg at 08/12/15 0805  . benzonatate (TESSALON) capsule 100 mg  100 mg Oral TID PRN Kelvin Cellar, MD      . cefTRIAXone (ROCEPHIN) 1 g in dextrose 5 % 50 mL IVPB  1 g Intravenous Q24H Kelvin Cellar, MD   1 g at 08/12/15 0515  . cloNIDine (CATAPRES) tablet 0.1 mg  0.1 mg Oral QID Kelvin Cellar, MD   0.1 mg at 08/12/15 0956   Followed by  . [START ON 08/13/2015] cloNIDine (CATAPRES) tablet 0.1 mg  0.1 mg Oral BH-qamhs Kelvin Cellar, MD       Followed by  . [START ON 08/15/2015] cloNIDine (CATAPRES) tablet 0.1 mg  0.1 mg Oral QAC breakfast Kelvin Cellar, MD      . dicyclomine (BENTYL) tablet 20 mg  20 mg Oral Q6H PRN Kelvin Cellar, MD      . enoxaparin (LOVENOX) injection 40 mg  40 mg Subcutaneous Q24H Kelvin Cellar, MD   40 mg at 08/11/15 2124  . estrogens (conjugated) (PREMARIN) tablet 0.625 mg  0.625 mg Oral Daily Royetta Asal, RPH   0.625 mg at 08/12/15 0956   And  . medroxyPROGESTERone (PROVERA) tablet 2.5 mg  2.5 mg Oral Daily Royetta Asal, RPH   2.5 mg at 08/12/15 0956  . haloperidol (HALDOL) tablet 2 mg  2 mg Oral Daily Kelvin Cellar, MD   2 mg at 08/12/15 0956  . hydrOXYzine (ATARAX/VISTARIL) tablet 25 mg  25 mg Oral Q6H PRN Kelvin Cellar, MD   25 mg at 08/12/15 0512  . ketorolac (TORADOL) 30 MG/ML injection 30 mg  30 mg Intravenous Q8H PRN Kelvin Cellar, MD   30 mg at 08/12/15 0647  . loperamide (IMODIUM) capsule 2-4 mg  2-4 mg Oral PRN Kelvin Cellar, MD      . loratadine (CLARITIN) tablet 10 mg  10 mg Oral Daily Royetta Asal, RPH   10 mg at 08/12/15 0957  . mesalamine (LIALDA) EC tablet 1.2 g  1.2 g Oral BID Kelvin Cellar, MD   1.2 g at 08/12/15 0956  . methocarbamol (ROBAXIN) tablet 500 mg  500 mg Oral Q8H PRN Kelvin Cellar, MD   500 mg at 08/12/15 0512  . methylphenidate (RITALIN) tablet 20 mg  20 mg Oral TID Kelvin Cellar, MD   20 mg at 08/12/15 0956  . OLANZapine (ZYPREXA) tablet 10 mg   10 mg Oral Daily Kelvin Cellar, MD   10 mg at 08/12/15 0956  . ondansetron (ZOFRAN-ODT) disintegrating tablet 4 mg  4 mg Oral Q6H PRN Ezequiel  Coralyn Pear, MD      . pantoprazole (PROTONIX) EC tablet 40 mg  40 mg Oral Daily Kelvin Cellar, MD   40 mg at 08/12/15 0956  . predniSONE (DELTASONE) tablet 20 mg  20 mg Oral Daily Kelvin Cellar, MD   20 mg at 08/12/15 0956  . sodium chloride 0.9 % injection 3 mL  3 mL Intravenous Q12H Kelvin Cellar, MD   3 mL at 08/11/15 2125  . traZODone (DESYREL) tablet 100 mg  100 mg Oral BID Kelvin Cellar, MD   100 mg at 08/12/15 2952    Musculoskeletal: Strength & Muscle Tone: decreased Gait & Station: unable to stand Patient leans: N/A  Psychiatric Specialty Exam: Physical Exam as per history and physical  ROS auditory/visual hallucinations paranoia, depression anxiety and opiate dependency No Fever-chills, No Headache, No changes with Vision or hearing, reports vertigo No problems swallowing food or Liquids, No Chest pain, Cough or Shortness of Breath, No Abdominal pain, No Nausea or Vommitting, Bowel movements are regular, No Blood in stool or Urine, No dysuria, No new skin rashes or bruises, No new joints pains-aches,  No new weakness, tingling, numbness in any extremity, No recent weight gain or loss, No polyuria, polydypsia or polyphagia,   A full 10 point Review of Systems was done, except as stated above, all other Review of Systems were negative.  Blood pressure 157/95, pulse 88, temperature 97.8 F (36.6 C), temperature source Oral, resp. rate 20, height '5\' 6"'  (1.676 m), weight 71.305 kg (157 lb 3.2 oz), SpO2 100 %.Body mass index is 25.38 kg/(m^2).  General Appearance: Guarded  Eye Contact::  Good  Speech:  Slow  Volume:  Decreased  Mood:  Anxious and Depressed  Affect:  Constricted and Depressed  Thought Process:  Coherent and Goal Directed  Orientation:  Full (Time, Place, and Person)  Thought Content:  Delusions,  Hallucinations: Auditory Visual and Paranoid Ideation  Suicidal Thoughts:  No  Homicidal Thoughts:  No  Memory:  Immediate;   Fair Recent;   Fair  Judgement:  Intact  Insight:  Fair  Psychomotor Activity:  Decreased  Concentration:  Fair  Recall:  Good  Fund of Knowledge:Good  Language: Good  Akathisia:  Negative  Handed:  Right  AIMS (if indicated):     Assets:  Communication Skills Desire for Improvement Financial Resources/Insurance Housing Leisure Time Resilience Social Support  ADL's:  Impaired  Cognition: WNL  Sleep:      Medical Decision Making: Review or order clinical lab tests (1), Discuss test with performing physician (1), Established Problem, Worsening (2), New Problem, with no additional work-up planned (3), Review of Last Therapy Session (1), Review or order medicine tests (1), Review of Medication Regimen & Side Effects (2) and Review of New Medication or Change in Dosage (2)  Treatment Plan Summary: Patient has a chronic psychiatric conditions like schizophrenia, ADHD, depression and anxiety presented with symptoms of pneumonia and asking detox for the opiates. Patient has no suicidal/homicidal ideation, intention or plans.  Daily contact with patient to assess and evaluate symptoms and progress in treatment and Medication management  Plan:  Appreciated psychiatric social service collateral information Safety concerns: Patient has no safety concerns Clonidine protocol for opiate detox treatment Continue Alprazolam 1 mg twice daily for anxiety,  Increase Haldol 2 mg twice daily and Zyprexa 10 mg daily for psychosis,  Trazodone 100 mg twice daily for mood and insomnia  Discontinue Methylphenidate because of increased hallucinations  Toradol 30 mg intravenous every 8 hours  as needed for moderate pain Patient does not meet criteria for psychiatric inpatient admission. Supportive therapy provided about ongoing stressors.  Appreciate psychiatric consultation and  follow-up as needed  Please contact [phone] or [phone] if needs further assistance   Disposition: Patient may be discharged home when medically stable and follow up with outpatient psychiatric medication management.  Nicole Fitzgerald,JANARDHAHA R. 08/12/2015 1:41 PM

## 2015-08-12 NOTE — Progress Notes (Addendum)
Progress Note   Nicole Fitzgerald WHQ:[id number] DOB: Dec 10, 1961 DOA: 08/10/2015 PCP: No primary care provider on file. [facility] in [place]: Dr. Lendon Colonel   Brief Narrative:   Nicole Fitzgerald is an 54 y.o. female with a past medical history of schizophrenia and Crohn's disease who was admitted 08/10/15 after requesting to be detoxed from narcotics. She also reported having a two-week history of cough associated with sputum production and shortness of breath. She complained of associated fevers chills fatigue as well as sharp stabbing chest pain with deep inspiration and cough. Two-view chest x-ray performed in the emergency department revealed mid left lower lobe pneumonia. Urine drug screen, back positive for opiates and benzodiazepine's. She also endorsed visual and auditory hallucinations.  Assessment/Plan:   Principal Problem:   CAP (community acquired pneumonia) - Continue Rocephin/azithromycin. - Follow-up blood cultures. - Add antitussives.  Active Problems:   Schizophrenia - Evaluated by psychiatrist 08/11/15. Not felt to meet criteria for inpatient psychiatric admission. - Continue psychotropic's per psychiatrist recommendations. Check QT interval on 12-lead EKG.    Crohn's disease - Continue prednisone.    Polysubstance abuse - Opiate withdrawal protocol with clonidine initiated.    DVT Prophylaxis - Lovenox ordered.  Family Communication: No family currently at the bedside. Disposition Plan: Home when stable, likely 1-2 days. Code Status:     Code Status Orders        Start     Ordered   08/11/15 0823  Full code   Continuous     08/11/15 0823        IV Access:    Peripheral IV   Procedures and diagnostic studies:   Dg Chest 2 View  08/11/2015   CLINICAL DATA:  Acute onset of shortness of breath. Left anterior rib pain. Initial encounter.  EXAM: CHEST  2 VIEW  COMPARISON:  Chest radiograph performed 08/10/2015  FINDINGS: The lungs are well-aerated. Mild  left lower lobe pneumonia is noted. There is no evidence of pleural effusion or pneumothorax.  The heart is normal in size; the mediastinal contour is within normal limits. No acute osseous abnormalities are seen.  IMPRESSION: Mild left lower lobe pneumonia noted.   Electronically Signed   By: Roanna Raider M.D.   On: 08/11/2015 04:06   Dg Ribs Unilateral W/chest Left  08/11/2015   CLINICAL DATA:  Larey Seat at home August 08, 2015, LEFT rib pain. Increasing shortness of breath.  EXAM: LEFT RIBS AND CHEST - 3+ VIEW  COMPARISON:  None.  FINDINGS: No fracture or other bone lesions are seen involving the ribs. There is no evidence of pneumothorax or pleural effusion. Patchy LEFT lower lobe airspace opacity, to lesser extent on the RIGHT. Heart size and mediastinal contours are within normal limits. Surgical clips in the included right abdomen compatible with cholecystectomy.  IMPRESSION: No acute rib fracture deformity.  Patchy bibasilar airspace opacities, nonspecific and can be seen with pectus excavatum though, would be better characterized and PA and lateral views of the chest when clinically able.   Electronically Signed   By: Awilda Metro M.D.   On: 08/11/2015 00:29   Ct Head Wo Contrast  08/11/2015   CLINICAL DATA:  Acute onset of visual and auditory hallucinations. Initial encounter.  EXAM: CT HEAD WITHOUT CONTRAST  TECHNIQUE: Contiguous axial images were obtained from the base of the skull through the vertex without intravenous contrast.  COMPARISON:  None.  FINDINGS: There is no evidence of acute infarction, mass lesion, or intra- or extra-axial  hemorrhage on CT.  Minimal subcortical white matter change likely reflects small vessel ischemic microangiopathy.  The posterior fossa, including the cerebellum, brainstem and fourth ventricle, is within normal limits. The third and lateral ventricles, and basal ganglia are unremarkable in appearance. The cerebral hemispheres are symmetric in appearance, with  normal gray-white differentiation. No mass effect or midline shift is seen.  There is no evidence of fracture; visualized osseous structures are unremarkable in appearance. The orbits are within normal limits. The paranasal sinuses and mastoid air cells are well-aerated. No significant soft tissue abnormalities are seen.  IMPRESSION: 1. No acute intracranial pathology seen on CT. 2. Minimal small vessel ischemic microangiopathy.   Electronically Signed   By: Roanna Raider M.D.   On: 08/11/2015 01:55     Medical Consultants:    Psychiatry  Anti-Infectives:    None.  Subjective:   Nicole Fitzgerald reports severe back pain.  She has had a moist/congested cough.  Objective:    Filed Vitals:   08/11/15 0951 08/11/15 1500 08/11/15 2114 08/12/15 0548  BP:  118/74 107/75 157/95  Pulse:  83 83 88  Temp:  98.1 F (36.7 C) 96.7 F (35.9 C) 97.8 F (36.6 C)  TempSrc:  Oral Axillary Oral  Resp:  20 20 20   Height: 5\' 6"  (1.676 m)     Weight: 71.305 kg (157 lb 3.2 oz)     SpO2:  98% 98% 100%    Intake/Output Summary (Last 24 hours) at 08/12/15 0901 Last data filed at 08/12/15 0600  Gross per 24 hour  Intake 2176.25 ml  Output      0 ml  Net 2176.25 ml    Exam: Gen:  Anxious and irritable Cardiovascular:  RRR, No M/R/G Respiratory:  Lungs with a few scattered rhonchi Gastrointestinal:  Abdomen soft, NT/ND, + BS Extremities:  No C/E/C   Data Reviewed:    Labs: Basic Metabolic Panel:  Recent Labs Lab 08/11/15 0056 08/11/15 0855  NA 134*  --   K 4.5  --   CL 98*  --   CO2 26  --   GLUCOSE 107*  --   BUN 15  --   CREATININE 1.03* 0.82  CALCIUM 8.6*  --    GFR Estimated Creatinine Clearance: 77.5 mL/min (by C-G formula based on Cr of 0.82). Liver Function Tests:  Recent Labs Lab 08/11/15 0056  AST 28  ALT 14  ALKPHOS 69  BILITOT 0.5  PROT 6.7  ALBUMIN 2.7*   CBC:  Recent Labs Lab 08/11/15 0056 08/11/15 0855  WBC 10.9* 6.2  HGB 14.1 11.9*  HCT 43.1  35.4*  MCV 87.2 86.6  PLT 263 189   Thyroid function studies:  Recent Labs  08/11/15 0855  TSH 1.065   Microbiology No results found for this or any previous visit (from the past 240 hour(s)).   Medications:   . ALPRAZolam  1 mg Oral BID  . azithromycin  500 mg Intravenous Q24H  . cefTRIAXone (ROCEPHIN)  IV  1 g Intravenous Q24H  . cloNIDine  0.1 mg Oral QID   Followed by  . [START ON 08/13/2015] cloNIDine  0.1 mg Oral BH-qamhs   Followed by  . [START ON 08/15/2015] cloNIDine  0.1 mg Oral QAC breakfast  . enoxaparin (LOVENOX) injection  40 mg Subcutaneous Q24H  . estrogens (conjugated)  0.625 mg Oral Daily   And  . medroxyPROGESTERone  2.5 mg Oral Daily  . haloperidol  2 mg Oral Daily  . loratadine  10 mg Oral Daily  . mesalamine  1.2 g Oral BID  . methylphenidate  20 mg Oral TID  . OLANZapine  10 mg Oral Daily  . pantoprazole  40 mg Oral Daily  . predniSONE  20 mg Oral Daily  . sodium chloride  3 mL Intravenous Q12H  . traZODone  100 mg Oral BID   Continuous Infusions: . sodium chloride 75 mL/hr at 08/11/15 1610    Time spent: 35 minutes with > 50% of time discussing current diagnostic test results, clinical impression and plan of care.   LOS: 1 day   Garyson Stelly  [facility] Pager [phone]. If unable to reach me by pager, please call my cell phone at [phone].  *Please refer to [email or web address], password [email or web address] to get updated schedule on who will round on this patient, as hospitalists switch teams weekly. If 7PM-7AM, please contact night-coverage at [email or web address], password [email or web address] for any overnight needs.  08/12/2015, 9:01 AM

## 2015-08-12 NOTE — Progress Notes (Signed)
CSW met with patient at bedside to get a release of information. Patient appeared to be drowsy and falling asleep.  The patient agreed to sign the release and noted her son as the only contact at this time.   CSW gave patient information regarding mental health and substance abuse facilities. The patient states that she does not have any questions at this time.  CSW made nurse aware.  Willette Brace [phone] ED CSW 08/12/2015 8:46 PM

## 2015-08-13 ENCOUNTER — Encounter (HOSPITAL_COMMUNITY): Payer: Self-pay | Admitting: Internal Medicine

## 2015-08-13 DIAGNOSIS — R9431 Abnormal electrocardiogram [ECG] [EKG]: Secondary | ICD-10-CM | POA: Diagnosis present

## 2015-08-13 DIAGNOSIS — I4581 Long QT syndrome: Secondary | ICD-10-CM

## 2015-08-13 DIAGNOSIS — I4510 Unspecified right bundle-branch block: Secondary | ICD-10-CM

## 2015-08-13 LAB — URINE CULTURE: Special Requests: NORMAL

## 2015-08-13 MED ORDER — CEFUROXIME AXETIL 500 MG PO TABS
500.0000 mg | ORAL_TABLET | Freq: Two times a day (BID) | ORAL | Status: DC
  Administered 2015-08-13 – 2015-08-15 (×4): 500 mg via ORAL
  Filled 2015-08-13 (×6): qty 1

## 2015-08-13 MED ORDER — AZITHROMYCIN 250 MG PO TABS
250.0000 mg | ORAL_TABLET | Freq: Every day | ORAL | Status: DC
Start: 2015-08-13 — End: 2015-08-13
  Administered 2015-08-13: 250 mg via ORAL
  Filled 2015-08-13: qty 1

## 2015-08-13 MED ORDER — LEVOFLOXACIN 750 MG PO TABS: 750.0000 mg | ORAL_TABLET | Freq: Every day | ORAL | Status: DC

## 2015-08-13 MED ORDER — FLUCONAZOLE 150 MG PO TABS
150.0000 mg | ORAL_TABLET | Freq: Every day | ORAL | Status: DC
  Administered 2015-08-13 – 2015-08-15 (×3): 150 mg via ORAL
  Filled 2015-08-13 (×3): qty 1

## 2015-08-13 MED ORDER — KETOROLAC TROMETHAMINE 10 MG PO TABS
10.0000 mg | ORAL_TABLET | Freq: Four times a day (QID) | ORAL | Status: DC | PRN
  Administered 2015-08-13 – 2015-08-14 (×4): 10 mg via ORAL
  Filled 2015-08-13 (×7): qty 1

## 2015-08-13 MED ORDER — ALPRAZOLAM 1 MG PO TABS
1.0000 mg | ORAL_TABLET | Freq: Four times a day (QID) | ORAL | Status: DC
  Administered 2015-08-13 – 2015-08-14 (×4): 1 mg via ORAL
  Filled 2015-08-13 (×2): qty 2
  Filled 2015-08-13 (×2): qty 1

## 2015-08-13 MED ORDER — NYSTATIN 100000 UNIT/ML MT SUSP
5.0000 mL | Freq: Four times a day (QID) | OROMUCOSAL | Status: DC
  Administered 2015-08-13 – 2015-08-15 (×9): 500000 [IU] via ORAL
  Filled 2015-08-13 (×11): qty 5

## 2015-08-13 MED ORDER — PROMETHAZINE HCL 25 MG PO TABS: 12.5000 mg | ORAL_TABLET | ORAL | Status: DC | PRN

## 2015-08-13 MED ORDER — LIDOCAINE 5 % EX PTCH
1.0000 | MEDICATED_PATCH | CUTANEOUS | Status: DC
  Administered 2015-08-13 – 2015-08-15 (×3): 1 via TRANSDERMAL
  Filled 2015-08-13 (×3): qty 1

## 2015-08-13 MED ORDER — PREDNISONE 10 MG PO TABS
10.0000 mg | ORAL_TABLET | Freq: Every day | ORAL | Status: DC
  Administered 2015-08-13 – 2015-08-15 (×3): 10 mg via ORAL
  Filled 2015-08-13 (×4): qty 1

## 2015-08-13 NOTE — Progress Notes (Signed)
Progress Note   Nicole Fitzgerald ZOX:[id number] DOB: 1961/02/04 DOA: 08/10/2015 PCP: No primary care provider on file. [facility] in [place]: Dr. Lendon Colonel   Brief Narrative:   Nicole Fitzgerald is an 54 y.o. female with a past medical history of schizophrenia and Crohn's disease who was admitted 08/10/15 after requesting to be detoxed from narcotics. She also reported having a two-week history of cough associated with sputum production and shortness of breath. She complained of associated fevers chills fatigue as well as sharp stabbing chest pain with deep inspiration and cough. Two-view chest x-ray performed in the emergency department revealed mid left lower lobe pneumonia. Urine drug screen, back positive for opiates and benzodiazepine's. She also endorsed visual and auditory hallucinations.  Assessment/Plan:   Principal Problem:   CAP (community acquired pneumonia) - Continue antibiotics and antitussives. Switch Rocephin to Ceftin.  D/C azithro given prolonged QTc. - Follow-up blood cultures (negative to date). - Recommend follow-up chest x-ray in 4 weeks to document clearing. - Mobilize/PT evaluation.  Active Problems:   Prolonged QTc and RBBB - No old EKGs for comparison. - Monitor on tele. - Minimize drugs that can prolong QTc: D/C azithro/Zofran.    Schizophrenia - Evaluated by psychiatrist 08/11/15. Not felt to meet criteria for inpatient psychiatric admission. - Continue psychotropic's per psychiatrist recommendations. QT interval on 12-lead EKG is prolonged (459 msec).    Crohn's disease - Continue prednisone.    Narcotic and polysubstance abuse - Opiate withdrawal protocol with clonidine initiated.    DVT Prophylaxis - Lovenox ordered.  Family Communication: No family currently at the bedside.  Called Nicole Fitzgerald (son) at [phone] & updated him by telephone. Disposition Plan: Home when stable, likely tomorrow. Code Status:     Code Status Orders        Start      Ordered   08/11/15 0823  Full code   Continuous     08/11/15 0823        IV Access:    Peripheral IV   Procedures and diagnostic studies:   X-ray Chest Pa And Lateral  08/12/2015   CLINICAL DATA:  Persistent cough, congestion, shortness breath, and chest pain  EXAM: CHEST  2 VIEW  COMPARISON:  Chest x-ray of August 11, 2015  FINDINGS: The lungs are better inflated today. Persistent lingular infiltrate is noted. Slightly increased density in the right infrahilar region is more conspicuous today. The heart and pulmonary vascularity are normal. The mediastinum is normal in width. There is no pleural effusion. The bony thorax is unremarkable.  IMPRESSION: Persistent lingular pneumonia. There may be early atelectasis or pneumonia in the right middle lobe medially. There is no pleural effusion or pneumothorax. Continued follow-up chest x-rays are recommended unless the patient's symptoms markedly. If such improvement is seen clinically, a 1 month follow-up chest x-ray would be useful to assure complete clearing.   Electronically Signed   By: David  [place]iland M.D.   On: 08/12/2015 09:36     Medical Consultants:    Psychiatry  Anti-Infectives:    Rocephin 08/12/15--->  Azithromycin 08/12/15--->  Subjective:   Nicole Fitzgerald reports severe back pain.  She has had a moist/congested cough.  Objective:    Filed Vitals:   08/11/15 2114 08/12/15 0548 08/12/15 1442 08/13/15 0620  BP: 107/75 157/95 106/44 149/91  Pulse: 83 88 101 80  Temp: 96.7 F (35.9 C) 97.8 F (36.6 C) 98.4 F (36.9 C) 98.1 F (36.7 C)  TempSrc: Axillary Oral Axillary Oral  Resp: 20 20 18 18   Height:      Weight:      SpO2: 98% 100% 99% 98%    Intake/Output Summary (Last 24 hours) at 08/13/15 4481 Last data filed at 08/13/15 0536  Gross per 24 hour  Intake   2370 ml  Output      0 ml  Net   2370 ml    Exam: Gen:  Anxious and irritable Cardiovascular:  RRR, No M/R/G Respiratory:  Lungs with a few  scattered rhonchi Gastrointestinal:  Abdomen soft, NT/ND, + BS Extremities:  No C/E/C   Data Reviewed:    Labs: Basic Metabolic Panel:  Recent Labs Lab 08/11/15 0056 08/11/15 0855  NA 134*  --   K 4.5  --   CL 98*  --   CO2 26  --   GLUCOSE 107*  --   BUN 15  --   CREATININE 1.03* 0.82  CALCIUM 8.6*  --    GFR Estimated Creatinine Clearance: 77.5 mL/min (by C-G formula based on Cr of 0.82). Liver Function Tests:  Recent Labs Lab 08/11/15 0056  AST 28  ALT 14  ALKPHOS 69  BILITOT 0.5  PROT 6.7  ALBUMIN 2.7*   CBC:  Recent Labs Lab 08/11/15 0056 08/11/15 0855  WBC 10.9* 6.2  HGB 14.1 11.9*  HCT 43.1 35.4*  MCV 87.2 86.6  PLT 263 189   Thyroid function studies:  Recent Labs  08/11/15 0855  TSH 1.065   Microbiology Recent Results (from the past 240 hour(s))  Culture, blood (routine x 2)     Status: None (Preliminary result)   Collection Time: 08/11/15 12:56 AM  Result Value Ref Range Status   Specimen Description BLOOD L HAND  Final   Special Requests BOTTLES DRAWN AEROBIC AND ANAEROBIC  Final   Culture   Final    NO GROWTH 1 DAY Performed at [facility]    Report Status PENDING  Incomplete  Culture, blood (routine x 2)     Status: None (Preliminary result)   Collection Time: 08/11/15 12:56 AM  Result Value Ref Range Status   Specimen Description BLOOD L WRIST  Final   Special Requests BOTTLES DRAWN AEROBIC AND ANAEROBIC  Final   Culture   Final    NO GROWTH 1 DAY Performed at [facility]    Report Status PENDING  Incomplete  Urine culture     Status: None (Preliminary result)   Collection Time: 08/11/15  1:51 AM  Result Value Ref Range Status   Specimen Description URINE, CLEAN CATCH  Final   Special Requests Normal  Final   Culture   Final    CULTURE REINCUBATED FOR BETTER GROWTH Performed at [facility]    Report Status PENDING  Incomplete     Medications:   . ALPRAZolam  1 mg Oral BID  .  azithromycin  500 mg Intravenous Q24H  . cefTRIAXone (ROCEPHIN)  IV  1 g Intravenous Q24H  . cloNIDine  0.1 mg Oral BH-qamhs   Followed by  . [START ON 08/15/2015] cloNIDine  0.1 mg Oral QAC breakfast  . dextromethorphan  30 mg Oral BID  . enoxaparin (LOVENOX) injection  40 mg Subcutaneous Q24H  . estrogens (conjugated)  0.625 mg Oral Daily   And  . medroxyPROGESTERone  2.5 mg Oral Daily  . guaiFENesin  600 mg Oral BID  . haloperidol  2 mg Oral BID  . loratadine  10 mg Oral Daily  .  mesalamine  1.2 g Oral BID  . OLANZapine  10 mg Oral Daily  . pantoprazole  40 mg Oral Daily  . predniSONE  20 mg Oral Daily  . sodium chloride  3 mL Intravenous Q12H  . traZODone  100 mg Oral BID   Continuous Infusions: . sodium chloride 75 mL/hr at 08/13/15 0234    Time spent: 25 minutes.   LOS: 2 days   Nicole Fitzgerald  [facility] Pager [phone]. If unable to reach me by pager, please call my cell phone at [phone].  *Please refer to [email or web address], password [email or web address] to get updated schedule on who will round on this patient, as hospitalists switch teams weekly. If 7PM-7AM, please contact night-coverage at [email or web address], password [email or web address] for any overnight needs.  08/13/2015, 8:11 AM

## 2015-08-13 NOTE — Evaluation (Signed)
Physical Therapy Evaluation Patient Details Name: Nicole Fitzgerald MRN: [id number] DOB: [birth year] Today's Date: 08/13/2015   History of Present Illness  Pt admitted s/p fall at home with hx of schizoprenia, Chron's and chronic back pain.  Pt presented to ED initially requestsing assist to detox from narcotics  Clinical Impression  Pt admitted as above and presenting with functional mobility limitations 2* generalized weakness and balance deficits.  Pt hopes to dc home with son but at present would require 24/7 supervision/assist.  If this is not available at home, pt could benefit from follow up rehab at SNF level to maximize IND and safety.      Follow Up Recommendations Home health PT;SNF;Supervision/Assistance - 24 hour    Equipment Recommendations  None recommended by PT    Recommendations for Other Services OT consult     Precautions / Restrictions Precautions Precautions: Fall Restrictions Weight Bearing Restrictions: No      Mobility  Bed Mobility Overal bed mobility: Needs Assistance Bed Mobility: Supine to Sit     Supine to sit: Supervision        Transfers Overall transfer level: Needs assistance Equipment used: Rolling walker (2 wheeled) Transfers: Sit to/from Stand Sit to Stand: Min assist         General transfer comment: cues for LE management and use of UEs to self assist  Ambulation/Gait Ambulation/Gait assistance: Min assist Ambulation Distance (Feet): 200 Feet Assistive device: Rolling walker (2 wheeled) Gait Pattern/deviations: Step-through pattern;Decreased step length - right;Decreased step length - left;Trunk flexed;Shuffle;Wide base of support Gait velocity: decr Gait velocity interpretation: Below normal speed for age/gender General Gait Details: cues for pace, posture and position from [employer]  Stairs            Wheelchair Mobility    Modified Rankin (Stroke Patients Only)       Balance Overall balance assessment: Needs  assistance Sitting-balance support: No upper extremity supported Sitting balance-Leahy Scale: Good     Standing balance support: Bilateral upper extremity supported Standing balance-Leahy Scale: Poor                               Pertinent Vitals/Pain Pain Assessment: 0-10 Pain Score: 6  Pain Location: back pain Pain Descriptors / Indicators: Aching Pain Intervention(s): Monitored during session;Limited activity within patient's tolerance    Home Living Family/patient expects to be discharged to:: Private residence Living Arrangements: Children Available Help at Discharge: Family Type of Home: House Home Access: Stairs to enter Entrance Stairs-Rails: Right Entrance Stairs-Number of Steps: 4 Home Layout: One level Home Equipment: Walker - 4 wheels Additional Comments: Pt states she moved in with son 3 days bvefor hospital admit.  Son works during day    Prior Function Level of Independence: Independent with assistive device(s)               Higher education careers adviser        Extremity/Trunk Assessment   Upper Extremity Assessment: Generalized weakness           Lower Extremity Assessment: Generalized weakness      Cervical / Trunk Assessment: Kyphotic  Communication   Communication: No difficulties  Cognition Arousal/Alertness: Lethargic;Suspect due to medications Behavior During Therapy: Flat affect Overall Cognitive Status: Within Functional Limits for tasks assessed                      General Comments      Exercises  Assessment/Plan    PT Assessment Patient needs continued PT services  PT Diagnosis Difficulty walking   PT Problem List Decreased strength;Decreased activity tolerance;Decreased balance;Decreased mobility;Decreased knowledge of use of DME;Decreased safety awareness  PT Treatment Interventions DME instruction;Gait training;Stair training;Functional mobility training;Therapeutic activities;Therapeutic  exercise;Balance training;Patient/family education   PT Goals (Current goals can be found in the Care Plan section) Acute Rehab PT Goals Patient Stated Goal: I need to get up and walk PT Goal Formulation: With patient Time For Goal Achievement: 08/27/15 Potential to Achieve Goals: Good    Frequency Min 3X/week   Barriers to discharge Decreased caregiver support Pt lives with son who works during the day    Co-evaluation               End of Session Equipment Utilized During Treatment: Gait belt Activity Tolerance: Patient tolerated treatment well;Patient limited by fatigue;Patient limited by lethargy Patient left: in bed;with call bell/phone within reach Nurse Communication: Mobility status         Time: 1415-1440 PT Time Calculation (min) (ACUTE ONLY): 25 min   Charges:     PT Treatments $Gait Training: 8-22 mins   PT G Codes:        Kosei Rhodes 2015/08/30, 3:35 PM

## 2015-08-14 DIAGNOSIS — G8929 Other chronic pain: Secondary | ICD-10-CM

## 2015-08-14 DIAGNOSIS — M549 Dorsalgia, unspecified: Secondary | ICD-10-CM

## 2015-08-14 DIAGNOSIS — M542 Cervicalgia: Secondary | ICD-10-CM | POA: Diagnosis present

## 2015-08-14 MED ORDER — ALPRAZOLAM 1 MG PO TABS
1.0000 mg | ORAL_TABLET | Freq: Four times a day (QID) | ORAL | Status: DC
  Administered 2015-08-14 – 2015-08-15 (×5): 1 mg via ORAL
  Filled 2015-08-14 (×6): qty 1

## 2015-08-14 MED ORDER — POLYETHYLENE GLYCOL 3350 17 G PO PACK
17.0000 g | PACK | Freq: Every day | ORAL | Status: DC
  Administered 2015-08-15: 17 g via ORAL
  Filled 2015-08-14 (×2): qty 1

## 2015-08-14 MED ORDER — TRAMADOL HCL 50 MG PO TABS
25.0000 mg | ORAL_TABLET | Freq: Four times a day (QID) | ORAL | Status: DC | PRN
  Administered 2015-08-14 – 2015-08-15 (×2): 25 mg via ORAL
  Filled 2015-08-14 (×2): qty 1

## 2015-08-14 MED ORDER — KETOROLAC TROMETHAMINE 10 MG PO TABS
10.0000 mg | ORAL_TABLET | Freq: Four times a day (QID) | ORAL | Status: DC | PRN
Start: 2015-08-14 — End: 2015-08-15
  Administered 2015-08-14 (×2): 10 mg via ORAL
  Filled 2015-08-14 (×3): qty 1

## 2015-08-14 MED ORDER — KETOROLAC TROMETHAMINE 10 MG PO TABS: 10.0000 mg | ORAL_TABLET | ORAL | Status: DC | PRN

## 2015-08-14 MED ORDER — TRAMADOL 5 MG/ML ORAL SUSPENSION: 25.0000 mg | Freq: Four times a day (QID) | ORAL | Status: DC | PRN

## 2015-08-14 NOTE — Progress Notes (Signed)
Progress Note   Nicole Fitzgerald ZDG:[id number] DOB: 05-15-1961 DOA: 08/10/2015 PCP: No primary care provider on file. [facility] in [place]: Dr. Lendon Colonel   Brief Narrative:   Nicole Dewing is an 54 y.o. female with a past medical history of schizophrenia and Crohn's disease who was admitted 08/10/15 after requesting to be detoxed from narcotics. She also reported having a two-week history of cough associated with sputum production and shortness of breath. She complained of associated fevers chills fatigue as well as sharp stabbing chest pain with deep inspiration and cough. Two-view chest x-ray performed in the emergency department revealed mid left lower lobe pneumonia. Urine drug screen, back positive for opiates and benzodiazepine's. She also endorsed visual and auditory hallucinations.  Assessment/Plan:   Principal Problem:   CAP (community acquired pneumonia) - Continue antibiotics and antitussives. Was switched from Rocephin/azithromycin to oral Ceftin 08/13/15. - Follow-up blood cultures (negative to date). - Recommend follow-up chest x-ray in 4 weeks to document clearing. - Mobilize/PT evaluation.  Active Problems:   Back/neck pain - Continue Toradol PO.  Will try low dose Ultram. - Avoid IV narcotics. - Heating pad ordered.    Prolonged QTc and RBBB - No old EKGs for comparison. - Monitor on tele. - Minimize drugs that can prolong QTc: D/C azithro/Zofran.    Schizophrenia - Evaluated by psychiatrist 08/11/15. Not felt to meet criteria for inpatient psychiatric admission. - Continue psychotropic's per psychiatrist recommendations. QT interval on 12-lead EKG is prolonged (459 msec).    Crohn's disease - Continue prednisone.    Narcotic and polysubstance abuse - Opiate withdrawal protocol with clonidine initiated.    DVT Prophylaxis - Lovenox ordered.  Family Communication: No family currently at the bedside.  Called Arvilla Market (son) at [phone] & updated him by  telephone 08/13/15. Disposition Plan: SNF recommended by PT. Does not currently have 24-hour supervision. Social worker consulted for assistance with discharge disposition. Can be discharged to a SNF when bed available, likely 08/15/15. Code Status:     Code Status Orders        Start     Ordered   08/11/15 0823  Full code   Continuous     08/11/15 0823        IV Access:    Peripheral IV   Procedures and diagnostic studies:   X-ray Chest Pa And Lateral  08/12/2015   CLINICAL DATA:  Persistent cough, congestion, shortness breath, and chest pain  EXAM: CHEST  2 VIEW  COMPARISON:  Chest x-ray of August 11, 2015  FINDINGS: The lungs are better inflated today. Persistent lingular infiltrate is noted. Slightly increased density in the right infrahilar region is more conspicuous today. The heart and pulmonary vascularity are normal. The mediastinum is normal in width. There is no pleural effusion. The bony thorax is unremarkable.  IMPRESSION: Persistent lingular pneumonia. There may be early atelectasis or pneumonia in the right middle lobe medially. There is no pleural effusion or pneumothorax. Continued follow-up chest x-rays are recommended unless the patient's symptoms markedly. If such improvement is seen clinically, a 1 month follow-up chest x-ray would be useful to assure complete clearing.   Electronically Signed   By: David  [place]iland M.D.   On: 08/12/2015 09:36     Medical Consultants:    Psychiatry  Anti-Infectives:    Rocephin 08/12/15--->  Azithromycin 08/12/15--->  Subjective:   Nicole Fitzgerald reports severe back pain, and is upset that the oral Toradol dose is much lower than the IV dose  she was receiving.  She has had a moist/congested cough, productive of greenish/yellow sputum. Tearful and anxious.  Feels like she never received her Xanax dose this morning.  Objective:    Filed Vitals:   08/13/15 1457 08/13/15 2120 08/13/15 2316 08/14/15 0527  BP: 145/81 159/90  164/91 164/92  Pulse: 82 95 78 77  Temp: 97.8 F (36.6 C) 97.5 F (36.4 C)  98 F (36.7 C)  TempSrc: Axillary Oral  Oral  Resp: 17 18  18   Height:      Weight:      SpO2: 99% 96%  98%    Intake/Output Summary (Last 24 hours) at 08/14/15 0759 Last data filed at 08/13/15 1852  Gross per 24 hour  Intake   1200 ml  Output      0 ml  Net   1200 ml    Exam: Gen:  Anxious and tearful Cardiovascular:  RRR, No M/R/G Respiratory:  Lungs with a few scattered rhonchi Gastrointestinal:  Abdomen soft, NT/ND, + BS Extremities:  No C/E/C   Data Reviewed:    Labs: Basic Metabolic Panel:  Recent Labs Lab 08/11/15 0056 08/11/15 0855  NA 134*  --   K 4.5  --   CL 98*  --   CO2 26  --   GLUCOSE 107*  --   BUN 15  --   CREATININE 1.03* 0.82  CALCIUM 8.6*  --    GFR Estimated Creatinine Clearance: 77.5 mL/min (by C-G formula based on Cr of 0.82). Liver Function Tests:  Recent Labs Lab 08/11/15 0056  AST 28  ALT 14  ALKPHOS 69  BILITOT 0.5  PROT 6.7  ALBUMIN 2.7*   CBC:  Recent Labs Lab 08/11/15 0056 08/11/15 0855  WBC 10.9* 6.2  HGB 14.1 11.9*  HCT 43.1 35.4*  MCV 87.2 86.6  PLT 263 189   Thyroid function studies:  Recent Labs  08/11/15 0855  TSH 1.065   Microbiology Recent Results (from the past 240 hour(s))  Culture, blood (routine x 2)     Status: None (Preliminary result)   Collection Time: 08/11/15 12:56 AM  Result Value Ref Range Status   Specimen Description BLOOD L HAND  Final   Special Requests BOTTLES DRAWN AEROBIC AND ANAEROBIC  Final   Culture   Final    NO GROWTH 2 DAYS Performed at [facility]    Report Status PENDING  Incomplete  Culture, blood (routine x 2)     Status: None (Preliminary result)   Collection Time: 08/11/15 12:56 AM  Result Value Ref Range Status   Specimen Description BLOOD L WRIST  Final   Special Requests BOTTLES DRAWN AEROBIC AND ANAEROBIC  Final   Culture   Final    NO GROWTH 2  DAYS Performed at [facility]    Report Status PENDING  Incomplete  Urine culture     Status: None   Collection Time: 08/11/15  1:51 AM  Result Value Ref Range Status   Specimen Description URINE, CLEAN CATCH  Final   Special Requests Normal  Final   Culture   Final    MULTIPLE SPECIES PRESENT, SUGGEST RECOLLECTION Performed at [facility]    Report Status 08/13/2015 FINAL  Final     Medications:   . ALPRAZolam  1 mg Oral 4 times per day  . cefUROXime  500 mg Oral BID WC  . cloNIDine  0.1 mg Oral BH-qamhs   Followed by  . [START ON  08/15/2015] cloNIDine  0.1 mg Oral QAC breakfast  . dextromethorphan  30 mg Oral BID  . enoxaparin (LOVENOX) injection  40 mg Subcutaneous Q24H  . estrogens (conjugated)  0.625 mg Oral Daily   And  . medroxyPROGESTERone  2.5 mg Oral Daily  . fluconazole  150 mg Oral Daily  . guaiFENesin  600 mg Oral BID  . haloperidol  2 mg Oral BID  . lidocaine  1 patch Transdermal Q24H  . loratadine  10 mg Oral Daily  . mesalamine  1.2 g Oral BID  . nystatin  5 mL Oral QID  . OLANZapine  10 mg Oral Daily  . pantoprazole  40 mg Oral Daily  . predniSONE  10 mg Oral Q breakfast  . sodium chloride  3 mL Intravenous Q12H  . traZODone  100 mg Oral BID   Continuous Infusions:    Time spent: 25 minutes.   LOS: 3 days   Emmet Messer  [facility] Pager [phone]. If unable to reach me by pager, please call my cell phone at [phone].  *Please refer to [email or web address], password [email or web address] to get updated schedule on who will round on this patient, as hospitalists switch teams weekly. If 7PM-7AM, please contact night-coverage at [email or web address], password [email or web address] for any overnight needs.  08/14/2015, 7:59 AM

## 2015-08-14 NOTE — Clinical Social Work Note (Addendum)
CSW received additional consult today for SNF  CSW met with pt to obtain permission to fax out to SNF's  Pt gave permission and stated that she now resides in [place] with her son.  The address is:  [place] [address]  CSW faxed info to SNF's but unable to obtain PASAAR without additional information. Form provided for MD signature and will fax or upload.  No weekend staff to receive letter so [place] number will not be obtained until tomorrow.  No discharge to SNF until [facility] number given.  Expect to receive tomorrow.    Weekday CSW to follow up with pt regarding SNF bed choices once received  Pt ok with CSW speaking with her son  Son stated that pt came to him asking for help with her drug problem and also told his wife that she wanted to die so he took her to the hospital.  Son also stated that pt told him she would go to rehab for drugs that's the only reason he agreed to her living with him.  CSW met with pt to see if she was interested in any drug rehab programs after her SNF/rehab and pt stated that she "didn't need any of that".    Dede Query, LCSW [place] Worker - Weekend Coverage cell #: [phone]

## 2015-08-15 DIAGNOSIS — M542 Cervicalgia: Secondary | ICD-10-CM

## 2015-08-15 DIAGNOSIS — M549 Dorsalgia, unspecified: Secondary | ICD-10-CM

## 2015-08-15 MED ORDER — XANAX 1 MG PO TABS: 1.0000 mg | ORAL_TABLET | Freq: Four times a day (QID) | ORAL | Status: DC

## 2015-08-15 MED ORDER — DEXTROMETHORPHAN POLISTIREX ER 30 MG/5ML PO SUER: 30.0000 mg | Freq: Two times a day (BID) | ORAL | Status: DC

## 2015-08-15 MED ORDER — FLUCONAZOLE 150 MG PO TABS: 150.0000 mg | ORAL_TABLET | Freq: Every day | ORAL | Status: AC

## 2015-08-15 MED ORDER — POLYETHYLENE GLYCOL 3350 17 G PO PACK: 17.0000 g | PACK | Freq: Every day | ORAL | Status: DC

## 2015-08-15 MED ORDER — CEFUROXIME AXETIL 500 MG PO TABS: 500.0000 mg | ORAL_TABLET | Freq: Two times a day (BID) | ORAL | Status: AC

## 2015-08-15 MED ORDER — TRAMADOL HCL 50 MG PO TABS: 25.0000 mg | ORAL_TABLET | Freq: Four times a day (QID) | ORAL | Status: DC | PRN

## 2015-08-15 MED ORDER — DICYCLOMINE HCL 20 MG PO TABS: 20.0000 mg | ORAL_TABLET | Freq: Four times a day (QID) | ORAL | Status: DC | PRN

## 2015-08-15 MED ORDER — PROMETHAZINE HCL 12.5 MG PO TABS
12.5000 mg | ORAL_TABLET | ORAL | Status: DC | PRN
Start: 2015-08-15 — End: 2015-09-06

## 2015-08-15 MED ORDER — NYSTATIN 100000 UNIT/ML MT SUSP: 5.0000 mL | Freq: Four times a day (QID) | OROMUCOSAL | Status: AC

## 2015-08-15 MED ORDER — HYDROXYZINE HCL 25 MG PO TABS: 25.0000 mg | ORAL_TABLET | Freq: Four times a day (QID) | ORAL | Status: DC | PRN

## 2015-08-15 MED ORDER — GUAIFENESIN ER 600 MG PO TB12: 600.0000 mg | ORAL_TABLET | Freq: Two times a day (BID) | ORAL | Status: DC

## 2015-08-15 MED ORDER — BENZONATATE 100 MG PO CAPS: 100.0000 mg | ORAL_CAPSULE | Freq: Three times a day (TID) | ORAL | Status: DC | PRN

## 2015-08-15 NOTE — Progress Notes (Signed)
Clinical Social Work  [facility] is able to accept patient once PASRR received. All clinicals faxed to [facility] and waiting on number. CSW will assist with DC once PASRR # received.  [place], [place] [phone]

## 2015-08-15 NOTE — Progress Notes (Signed)
Report called to nurse at [facility]. Discharge instructions accompanied pt, left the unit in stable condition, transported to nursing facility via ambulance.

## 2015-08-15 NOTE — Consult Note (Signed)
[facility] Face-to-Face Psychiatry Consult follow-up  Reason for Consult:  Psychotic hallucination and opioid dependence Referring Physician:  Dr. Julian Reil Patient Identification: Nicole Fitzgerald MRN:  [id number] Principal Diagnosis: CAP (community acquired pneumonia) Diagnosis:   Patient Active Problem List   Diagnosis Date Noted  . Chronic neck and back pain [M54.2, M54.9] 08/14/2015  . Prolonged QT interval [I45.81] 08/13/2015  . RBBB (right bundle branch block) [I45.10] 08/13/2015  . Polysubstance abuse [F19.10] 08/12/2015  . Narcotic abuse [F11.10]   . Psychoses [F29]   . Schizophrenia [F20.9] 08/11/2015  . CAP (community acquired pneumonia) [J18.9] 08/11/2015  . Crohn's disease [K50.90] 08/11/2015    Total Time spent with patient: 30 minutes  Subjective:   Nicole Fitzgerald is a 54 y.o. female patient admitted with asking opioid detox.   HPI:  Nicole Fitzgerald is a 54 y.o. female seen for face-to-face psychiatry consultation and evaluation of hallucinations and detox treatment for opiates. Patient reported that she was initially came to the hospital with community-acquired pneumonia and then seeking detox treatment for opiates. Patient reportedly suffering with chronic mental illness especially psychosis, anxiety, ADHD and has been receiving outpatient medication management from [facility] in [place]. Patient reportedly was seen Dr. Tresa Endo and Dr. Theodore Demark in the past 25 years. Patient also reportedly she has been placed on disability secondary to having a severe symptoms of auditory/visual hallucinations and paranoia. Patient has been taking multiple psychiatric medication. She has been taking pain medication for chronic stasis. Patient urine drug screen, back positive for opiates and benzodiazepine's probably due to her description medication. Patient has  visual and auditory hallucinations over the past week. She currently denies suicidal or homicidal ideations, Intention or plans. Patient has no  recent acute psychiatric hospitalizations. Patient has been staying with her son and mostly staying herself in the home and has limited outings.  Interval history: Patient has no complaints and denied current auditory and visual hallucination. She continue to report mild symptoms of depression and anxiety. She has no symptoms of opioid withdrawal symptoms and successfully completed detox protocol. She has no suicide or homicide ideations. She has plans of going to rehabilitation today.   Past Medical History:  Past Medical History  Diagnosis Date  . Crohn's disease   . Chronic back pain   . Prolonged QT interval 08/13/2015  . RBBB (right bundle branch block) 08/13/2015    Past Surgical History  Procedure Laterality Date  . Cholecystectomy    . Abdominal hysterectomy    . Bowel resection     Family History:  Family History  Problem Relation Age of Onset  . Hypertension Mother    Social History:  History  Alcohol Use No     History  Drug Use No    Social History   Social History  . Marital Status: Legally Separated    Spouse Name: N/A  . Number of Children: N/A  . Years of Education: N/A   Social History Main Topics  . Smoking status: Current Every Day Smoker    Types: Cigarettes  . Smokeless tobacco: None  . Alcohol Use: No  . Drug Use: No  . Sexual Activity: Not Asked   Other Topics Concern  . None   Social History Narrative   Additional Social History:                          Allergies:   Allergies  Allergen Reactions  . Aspirin Other (See Comments)  MD told not to take due to crohns    Labs:  No results found for this or any previous visit (from the past 48 hour(s)).  Vitals: Blood pressure 122/82, pulse 85, temperature 98.4 F (36.9 C), temperature source Oral, resp. rate 18, height 5\' 6"  (1.676 m), weight 71.305 kg (157 lb 3.2 oz), SpO2 99 %.  Risk to Self: Is patient at risk for suicide?: No, but patient needs Medical Clearance Risk  to Others:   Prior Inpatient Therapy:   Prior Outpatient Therapy:    Current Facility-Administered Medications  Medication Dose Route Frequency Provider Last Rate Last Dose  . acetaminophen (TYLENOL) tablet 650 mg  650 mg Oral Q6H PRN Jeralyn Bennett, MD   650 mg at 08/15/15 0636   Or  . acetaminophen (TYLENOL) suppository 650 mg  650 mg Rectal Q6H PRN Jeralyn Bennett, MD      . albuterol (PROVENTIL) (2.5 MG/3ML) 0.083% nebulizer solution 3 mL  3 mL Inhalation Q4H PRN Jeralyn Bennett, MD      . ALPRAZolam Prudy Feeler) tablet 1 mg  1 mg Oral QID Maryruth Bun Rama, MD   1 mg at 08/15/15 1417  . alum & mag hydroxide-simeth (MAALOX/MYLANTA) 200-200-20 MG/5ML suspension 30 mL  30 mL Oral Q6H PRN Jeralyn Bennett, MD      . benzonatate (TESSALON) capsule 100 mg  100 mg Oral TID PRN Jeralyn Bennett, MD   100 mg at 08/13/15 0733  . cefUROXime (CEFTIN) tablet 500 mg  500 mg Oral BID WC Maryruth Bun Rama, MD   500 mg at 08/15/15 0738  . cloNIDine (CATAPRES) tablet 0.1 mg  0.1 mg Oral QAC breakfast Jeralyn Bennett, MD   0.1 mg at 08/15/15 0900  . dextromethorphan (DELSYM) 30 MG/5ML liquid 30 mg  30 mg Oral BID Maryruth Bun Rama, MD   30 mg at 08/15/15 1011  . dicyclomine (BENTYL) tablet 20 mg  20 mg Oral Q6H PRN Jeralyn Bennett, MD      . enoxaparin (LOVENOX) injection 40 mg  40 mg Subcutaneous Q24H Jeralyn Bennett, MD   40 mg at 08/14/15 2216  . estrogens (conjugated) (PREMARIN) tablet 0.625 mg  0.625 mg Oral Daily Adalberto Cole, RPH   0.625 mg at 08/15/15 1010   And  . medroxyPROGESTERone (PROVERA) tablet 2.5 mg  2.5 mg Oral Daily Adalberto Cole, RPH   2.5 mg at 08/15/15 1014  . fluconazole (DIFLUCAN) tablet 150 mg  150 mg Oral Daily Maryruth Bun Rama, MD   150 mg at 08/15/15 1011  . guaiFENesin (MUCINEX) 12 hr tablet 600 mg  600 mg Oral BID Maryruth Bun Rama, MD   600 mg at 08/15/15 1009  . haloperidol (HALDOL) tablet 2 mg  2 mg Oral BID Leata Mouse, MD   2 mg at 08/15/15 1014  . hydrOXYzine  (ATARAX/VISTARIL) tablet 25 mg  25 mg Oral Q6H PRN Jeralyn Bennett, MD   25 mg at 08/15/15 0413  . ketorolac (TORADOL) tablet 10 mg  10 mg Oral Q6H PRN Maryruth Bun Rama, MD   10 mg at 08/14/15 1940  . lidocaine (LIDODERM) 5 % 1 patch  1 patch Transdermal Q24H Maryruth Bun Rama, MD   1 patch at 08/15/15 1417  . loperamide (IMODIUM) capsule 2-4 mg  2-4 mg Oral PRN Jeralyn Bennett, MD      . loratadine (CLARITIN) tablet 10 mg  10 mg Oral Daily Adalberto Cole, RPH   10 mg at 08/15/15 1011  . mesalamine (LIALDA) EC tablet 1.2 g  1.2 g Oral BID Jeralyn Bennett, MD   1.2 g at 08/15/15 1014  . methocarbamol (ROBAXIN) tablet 500 mg  500 mg Oral Q8H PRN Jeralyn Bennett, MD   500 mg at 08/14/15 0013  . nystatin (MYCOSTATIN) 100000 UNIT/ML suspension 500,000 Units  5 mL Oral QID Maryruth Bun Rama, MD   500,000 Units at 08/15/15 1417  . OLANZapine (ZYPREXA) tablet 10 mg  10 mg Oral Daily Jeralyn Bennett, MD   10 mg at 08/15/15 1008  . pantoprazole (PROTONIX) EC tablet 40 mg  40 mg Oral Daily Jeralyn Bennett, MD   40 mg at 08/15/15 1009  . polyethylene glycol (MIRALAX / GLYCOLAX) packet 17 g  17 g Oral Daily Maryruth Bun Rama, MD   17 g at 08/15/15 1009  . predniSONE (DELTASONE) tablet 10 mg  10 mg Oral Q breakfast Maryruth Bun Rama, MD   10 mg at 08/15/15 0738  . promethazine (PHENERGAN) tablet 12.5 mg  12.5 mg Oral Q4H PRN Christina P Rama, MD      . sodium chloride 0.9 % injection 3 mL  3 mL Intravenous Q12H Jeralyn Bennett, MD   3 mL at 08/11/15 2125  . traMADol (ULTRAM) tablet 25 mg  25 mg Oral Q6H PRN Maryruth Bun Rama, MD   25 mg at 08/15/15 0413  . traZODone (DESYREL) tablet 100 mg  100 mg Oral BID Jeralyn Bennett, MD   100 mg at 08/15/15 1010    Musculoskeletal: Strength & Muscle Tone: decreased Gait & Station: unable to stand Patient leans: N/A  Psychiatric Specialty Exam: Physical Exam as per history and physical  ROS auditory/visual hallucinations paranoia, depression anxiety and opiate  dependency No Fever-chills, No Headache, No changes with Vision or hearing, reports vertigo No problems swallowing food or Liquids, No Chest pain, Cough or Shortness of Breath, No Abdominal pain, No Nausea or Vommitting, Bowel movements are regular, No Blood in stool or Urine, No dysuria, No new skin rashes or bruises, No new joints pains-aches,  No new weakness, tingling, numbness in any extremity, No recent weight gain or loss, No polyuria, polydypsia or polyphagia,   A full 10 point Review of Systems was done, except as stated above, all other Review of Systems were negative.  Blood pressure 122/82, pulse 85, temperature 98.4 F (36.9 C), temperature source Oral, resp. rate 18, height 5\' 6"  (1.676 m), weight 71.305 kg (157 lb 3.2 oz), SpO2 99 %.Body mass index is 25.38 kg/(m^2).  General Appearance: Guarded  Eye Contact::  Good  Speech:  Slow  Volume:  Decreased  Mood:  Anxious and Depressed  Affect:  Constricted and Depressed  Thought Process:  Coherent and Goal Directed  Orientation:  Full (Time, Place, and Person)  Thought Content:  Delusions, Hallucinations: Auditory Visual and Paranoid Ideation  Suicidal Thoughts:  No  Homicidal Thoughts:  No  Memory:  Immediate;   Fair Recent;   Fair  Judgement:  Intact  Insight:  Fair  Psychomotor Activity:  Decreased  Concentration:  Fair  Recall:  Good  Fund of Knowledge:Good  Language: Good  Akathisia:  Negative  Handed:  Right  AIMS (if indicated):     Assets:  Communication Skills Desire for Improvement Financial Resources/Insurance Housing Leisure Time Resilience Social Support  ADL's:  Impaired  Cognition: WNL  Sleep:      Medical Decision Making: Review or order clinical lab tests (1), Discuss test with performing physician (1), Established Problem, Worsening (2), New Problem, with no additional work-up planned (3),  Review of Last Therapy Session (1), Review or order medicine tests (1), Review of Medication  Regimen & Side Effects (2) and Review of New Medication or Change in Dosage (2)  Treatment Plan Summary: Patient has a chronic psychiatric conditions like schizophrenia, ADHD, depression and anxiety presented with symptoms of pneumonia and asking detox for the opiates. Patient has no suicidal/homicidal ideation, intention or plans.  Daily contact with patient to assess and evaluate symptoms and progress in treatment and Medication management  Plan:  Clonidine protocol for opiate detox treatment - completed Continue Alprazolam 1 mg twice daily for anxiety,  Continue Haldol 2 mg twice daily and Zyprexa 10 mg daily for psychosis,  Trazodone 100 mg twice daily for mood and insomnia  Toradol 30 mg intravenous every 8 hours as needed for moderate pain Patient does not meet criteria for psychiatric inpatient admission. Supportive therapy provided about ongoing stressors.  Appreciate psychiatric consultation and follow-up as needed  Please contact [phone] or [phone] if needs further assistance   Disposition: Patient may be discharged home when medically stable and follow up with outpatient psychiatric medication management.  Sebastain Fishbaugh,JANARDHAHA R. 08/15/2015 2:22 PM

## 2015-08-15 NOTE — Care Management Important Message (Signed)
Important Message  Patient Details  Name: Nicole Fitzgerald MRN: [id number] Date of Birth: [birth year]   Medicare Important Message Given:  Yes-second notification given    Renie Ora 08/15/2015, 4:21 PMImportant Message  Patient Details  Name: Nicole Fitzgerald MRN: [id number] Date of Birth: Jun 07, 1961   Medicare Important Message Given:  Yes-second notification given    Renie Ora 08/15/2015, 4:21 PM

## 2015-08-15 NOTE — Discharge Summary (Signed)
Physician Discharge Summary  Nicole Fitzgerald ZOX:[id number] DOB: 11-29-1961 DOA: 08/10/2015  PCP: Dr. Derek Jack, [facility] in [place]  Admit date: 08/10/2015 Discharge date: 08/15/2015   Recommendations for Outpatient Follow-Up:   1. Recommend F/U CXR in 4 weeks to ensure resolution of pneumonia and to exclude underlying pathology. 2. Recommend outpatient referral for substance abuse counseling/psychiatric follow up.   Discharge Diagnosis:   Principal Problem:    CAP (community acquired pneumonia) Active Problems:    Schizophrenia    Crohn's disease    Polysubstance abuse    Narcotic abuse    Psychoses    Prolonged QT interval    RBBB (right bundle branch block)    Chronic neck and back pain   Discharge disposition:   SNF  Discharge Condition: Improved.  Diet recommendation: Regular.    History of Present Illness:   Nicole Fitzgerald is an 54 y.o. female with a past medical history of schizophrenia and Crohn's disease who was admitted 08/10/15 after requesting to be detoxed from narcotics. She also reported having a two-week history of cough associated with sputum production and shortness of breath. She complained of associated fevers chills fatigue as well as sharp stabbing chest pain with deep inspiration and cough. Two-view chest x-ray performed in the emergency department revealed mid left lower lobe pneumonia. Urine drug screen, back positive for opiates and benzodiazepine's. She also endorsed visual and auditory hallucinations.  Hospital Course by Problem:   Principal Problem:  CAP (community acquired pneumonia) - Continue antibiotics and antitussives. Was switched from Rocephin/azithromycin to oral Ceftin 08/13/15. - Follow-up blood cultures (negative to date). - Recommend follow-up chest x-ray in 4 weeks to document clearing. - Status post PT evaluation, SNF recommended.  Active Problems:  Back/neck pain - Treated with Toradol PO. D/C to SNF on  dose Ultram. - Avoid high potency narcotics. - Heating pad ordered.   Prolonged QTc and RBBB - No old EKGs for comparison. - Monitored on tele with no arrhythmic events. - Minimize drugs that can prolong QTc., if possible.   Schizophrenia - Evaluated by psychiatrist 08/11/15. Not felt to meet criteria for inpatient psychiatric admission. - Continue psychotropic's per psychiatrist recommendations. QT interval on 12-lead EKG is prolonged (459 msec). - No active hallucinations while in the hospital.   Crohn's disease - Continue prednisone.   Narcotic and polysubstance abuse - Opiate withdrawal protocol with clonidine completed. Can use bentyl if needed for intestinal cramps. - Declined substance abuse rehabilitation per social worker note.  Medical Consultants:    Psychiatry: Leata Mouse, MD   Discharge Exam:   Filed Vitals:   08/15/15 0527  BP: 156/85  Pulse: 75  Temp: 98 F (36.7 C)  Resp: 18   Filed Vitals:   08/14/15 1005 08/14/15 1507 08/14/15 2130 08/15/15 0527  BP: 137/76 137/84 159/83 156/85  Pulse: 80 84 70 75  Temp:  98.6 F (37 C) 97.8 F (36.6 C) 98 F (36.7 C)  TempSrc:  Oral Oral Oral  Resp:  18 18 18   Height:      Weight:      SpO2:  97% 97% 97%    Gen:  NAD Cardiovascular:  RRR, No M/R/G Respiratory: Lungs mostly CTAB Gastrointestinal: Abdomen soft, NT/ND with normal active bowel sounds. Extremities: No C/E/C   The results of significant diagnostics from this hospitalization (including imaging, microbiology, ancillary and laboratory) are listed below for reference.     Procedures and Diagnostic Studies:   X-ray Chest Pa And Lateral  08/12/2015   CLINICAL DATA:  Persistent cough, congestion, shortness breath, and chest pain  EXAM: CHEST  2 VIEW  COMPARISON:  Chest x-ray of August 11, 2015  FINDINGS: The lungs are better inflated today. Persistent lingular infiltrate is noted. Slightly increased density in the right infrahilar  region is more conspicuous today. The heart and pulmonary vascularity are normal. The mediastinum is normal in width. There is no pleural effusion. The bony thorax is unremarkable.  IMPRESSION: Persistent lingular pneumonia. There may be early atelectasis or pneumonia in the right middle lobe medially. There is no pleural effusion or pneumothorax. Continued follow-up chest x-rays are recommended unless the patient's symptoms markedly. If such improvement is seen clinically, a 1 month follow-up chest x-ray would be useful to assure complete clearing.   Electronically Signed   By: David  [place] M.D.   On: 08/12/2015 09:36   Dg Chest 2 View  08/11/2015   CLINICAL DATA:  Acute onset of shortness of breath. Left anterior rib pain. Initial encounter.  EXAM: CHEST  2 VIEW  COMPARISON:  Chest radiograph performed 08/10/2015  FINDINGS: The lungs are well-aerated. Mild left lower lobe pneumonia is noted. There is no evidence of pleural effusion or pneumothorax.  The heart is normal in size; the mediastinal contour is within normal limits. No acute osseous abnormalities are seen.  IMPRESSION: Mild left lower lobe pneumonia noted.   Electronically Signed   By: Roanna Raider M.D.   On: 08/11/2015 04:06   Dg Ribs Unilateral W/chest Left  08/11/2015   CLINICAL DATA:  Larey Seat at home August 08, 2015, LEFT rib pain. Increasing shortness of breath.  EXAM: LEFT RIBS AND CHEST - 3+ VIEW  COMPARISON:  None.  FINDINGS: No fracture or other bone lesions are seen involving the ribs. There is no evidence of pneumothorax or pleural effusion. Patchy LEFT lower lobe airspace opacity, to lesser extent on the RIGHT. Heart size and mediastinal contours are within normal limits. Surgical clips in the included right abdomen compatible with cholecystectomy.  IMPRESSION: No acute rib fracture deformity.  Patchy bibasilar airspace opacities, nonspecific and can be seen with pectus excavatum though, would be better characterized and PA and lateral  views of the chest when clinically able.   Electronically Signed   By: Awilda Metro M.D.   On: 08/11/2015 00:29   Ct Head Wo Contrast  08/11/2015   CLINICAL DATA:  Acute onset of visual and auditory hallucinations. Initial encounter.  EXAM: CT HEAD WITHOUT CONTRAST  TECHNIQUE: Contiguous axial images were obtained from the base of the skull through the vertex without intravenous contrast.  COMPARISON:  None.  FINDINGS: There is no evidence of acute infarction, mass lesion, or intra- or extra-axial hemorrhage on CT.  Minimal subcortical white matter change likely reflects small vessel ischemic microangiopathy.  The posterior fossa, including the cerebellum, brainstem and fourth ventricle, is within normal limits. The third and lateral ventricles, and basal ganglia are unremarkable in appearance. The cerebral hemispheres are symmetric in appearance, with normal gray-white differentiation. No mass effect or midline shift is seen.  There is no evidence of fracture; visualized osseous structures are unremarkable in appearance. The orbits are within normal limits. The paranasal sinuses and mastoid air cells are well-aerated. No significant soft tissue abnormalities are seen.  IMPRESSION: 1. No acute intracranial pathology seen on CT. 2. Minimal small vessel ischemic microangiopathy.   Electronically Signed   By: Roanna Raider M.D.   On: 08/11/2015 01:55     Labs:  Basic Metabolic Panel:  Recent Labs Lab 08/11/15 0056 08/11/15 0855  NA 134*  --   K 4.5  --   CL 98*  --   CO2 26  --   GLUCOSE 107*  --   BUN 15  --   CREATININE 1.03* 0.82  CALCIUM 8.6*  --    GFR Estimated Creatinine Clearance: 77.5 mL/min (by C-G formula based on Cr of 0.82). Liver Function Tests:  Recent Labs Lab 08/11/15 0056  AST 28  ALT 14  ALKPHOS 69  BILITOT 0.5  PROT 6.7  ALBUMIN 2.7*   CBC:  Recent Labs Lab 08/11/15 0056 08/11/15 0855  WBC 10.9* 6.2  HGB 14.1 11.9*  HCT 43.1 35.4*  MCV 87.2 86.6    PLT 263 189   Microbiology Recent Results (from the past 240 hour(s))  Culture, blood (routine x 2)     Status: None (Preliminary result)   Collection Time: 08/11/15 12:56 AM  Result Value Ref Range Status   Specimen Description BLOOD L HAND  Final   Special Requests BOTTLES DRAWN AEROBIC AND ANAEROBIC  Final   Culture   Final    NO GROWTH 3 DAYS Performed at [facility]    Report Status PENDING  Incomplete  Culture, blood (routine x 2)     Status: None (Preliminary result)   Collection Time: 08/11/15 12:56 AM  Result Value Ref Range Status   Specimen Description BLOOD L WRIST  Final   Special Requests BOTTLES DRAWN AEROBIC AND ANAEROBIC  Final   Culture   Final    NO GROWTH 3 DAYS Performed at [facility]    Report Status PENDING  Incomplete  Urine culture     Status: None   Collection Time: 08/11/15  1:51 AM  Result Value Ref Range Status   Specimen Description URINE, CLEAN CATCH  Final   Special Requests Normal  Final   Culture   Final    MULTIPLE SPECIES PRESENT, SUGGEST RECOLLECTION Performed at [facility]    Report Status 08/13/2015 FINAL  Final     Discharge Instructions:       Discharge Instructions    Call MD for:  extreme fatigue    Complete by:  As directed      Call MD for:  temperature >100.4    Complete by:  As directed      Call MD for:    Complete by:  As directed   Worsening shortness of breath, cough.     Diet general    Complete by:  As directed      Discharge instructions    Complete by:  As directed   You were treated for pneumonia while you were in the hospital.  It is important that you see your PCP in follow up, and have him/her order a follow up chest x-ray in 4-6 weeks to ensure resolution of the pneumonia and to exclude any underlying pathology.  Take all of your antibiotics, as prescribed, even if you feel better.  Do not discontinue antibiotics prematurely.     Increase activity slowly    Complete  by:  As directed             Medication List    STOP taking these medications        HYDROmorphone 4 MG tablet  Commonly known as:  DILAUDID     methylphenidate 20 MG tablet  Commonly known as:  RITALIN  OPANA ER (CRUSH RESISTANT) 40 MG T12a  Generic drug:  Oxymorphone HCl (Crush Resist)     oxycodone 30 MG immediate release tablet  Commonly known as:  ROXICODONE      TAKE these medications        AMITIZA 24 MCG capsule  Generic drug:  lubiprostone  Take 1 capsule by mouth 2 (two) times daily as needed.     benzonatate 100 MG capsule  Commonly known as:  TESSALON  Take 1 capsule (100 mg total) by mouth 3 (three) times daily as needed for cough.     cefUROXime 500 MG tablet  Commonly known as:  CEFTIN  Take 1 tablet (500 mg total) by mouth 2 (two) times daily with a meal.     dextromethorphan 30 MG/5ML liquid  Commonly known as:  DELSYM  Take 5 mLs (30 mg total) by mouth 2 (two) times daily.     dicyclomine 20 MG tablet  Commonly known as:  BENTYL  Take 1 tablet (20 mg total) by mouth every 6 (six) hours as needed for spasms (abdominal cramping).     fluconazole 150 MG tablet  Commonly known as:  DIFLUCAN  Take 1 tablet (150 mg total) by mouth daily.     guaiFENesin 600 MG 12 hr tablet  Commonly known as:  MUCINEX  Take 1 tablet (600 mg total) by mouth 2 (two) times daily.     haloperidol 2 MG tablet  Commonly known as:  HALDOL  Take 1 tablet by mouth daily.     hydrOXYzine 25 MG tablet  Commonly known as:  ATARAX/VISTARIL  Take 1 tablet (25 mg total) by mouth every 6 (six) hours as needed for anxiety.     levocetirizine 5 MG tablet  Commonly known as:  XYZAL  Take 1 tablet by mouth daily.     LIALDA 1.2 G EC tablet  Generic drug:  mesalamine  Take 1 tablet by mouth 2 (two) times daily.     LIDODERM 5 %  Generic drug:  lidocaine  Place 1 patch onto the skin 2 (two) times daily.     NEXIUM 40 MG capsule  Generic drug:  esomeprazole  Take 1  capsule by mouth 2 (two) times daily.     nystatin 100000 UNIT/ML suspension  Commonly known as:  MYCOSTATIN  Take 5 mLs (500,000 Units total) by mouth 4 (four) times daily.     OLANZapine 10 MG tablet  Commonly known as:  ZYPREXA  Take 1 tablet by mouth daily.     OLANZapine 20 MG tablet  Commonly known as:  ZYPREXA  Take 1 tablet by mouth daily.     polyethylene glycol packet  Commonly known as:  MIRALAX / GLYCOLAX  Take 17 g by mouth daily.     potassium chloride SA 20 MEQ tablet  Commonly known as:  K-DUR,KLOR-CON  Take 1 tablet by mouth daily.     predniSONE 20 MG tablet  Commonly known as:  DELTASONE  Take 1 tablet by mouth daily.     PREMPRO 0.625-2.5 MG per tablet  Generic drug:  estrogen (conjugated)-medroxyprogesterone  Take 1 tablet by mouth daily.     PROAIR HFA 108 (90 BASE) MCG/ACT inhaler  Generic drug:  albuterol  Inhale 1 puff into the lungs every 4 (four) hours as needed.     promethazine 12.5 MG tablet  Commonly known as:  PHENERGAN  Take 1 tablet (12.5 mg total) by mouth every 4 (four) hours as needed  for nausea or vomiting.     traMADol 50 MG tablet  Commonly known as:  ULTRAM  Take 0.5 tablets (25 mg total) by mouth every 6 (six) hours as needed for severe pain.     traZODone 100 MG tablet  Commonly known as:  DESYREL  Take 1 tablet by mouth 2 (two) times daily.     XANAX 1 MG tablet  Generic drug:  ALPRAZolam  Take 1 tablet (1 mg total) by mouth 4 (four) times daily.       Follow-up Information    Schedule an appointment as soon as possible for a visit with Dr. Lendon Colonel.   Why:  in 4 weeks for a repeat chest X ray.       Time coordinating discharge: 35 minutes.  Signed:  RAMA,CHRISTINA  Pager [phone] [facility] 08/15/2015, 9:54 AM

## 2015-08-15 NOTE — Progress Notes (Signed)
Clinical Social Work  Patient accepted bed offer at [facility] and [facility] number received. CSW prepared DC packet with FL2, DC summary and hard scripts included. CSW made patient and son aware of DC plans.  Patient agreeable to PTAR and aware of no guarantee of payment. RN to call report.  PTAR arranged for 2pm. Request #: [id number].  CSW is signing off but available if needed.  [place], [place] [phone]

## 2015-08-15 NOTE — Progress Notes (Signed)
Physical Therapy Treatment Patient Details Name: Nicole Fitzgerald MRN: [id number] DOB: [birth year] Today's Date: 08/15/2015    History of Present Illness Pt admitted s/p fall at home with hx of schizoprenia, Chrohn's and chronic back pain.  Pt presented to ED initially requestsing assist to detox from narcotics.  Dx of CAP.     PT Comments    Increased gait distance today, pt walked 240' with RW, HR 122-132 and SaO2 96-99% on RA.   Follow Up Recommendations  SNF;Supervision/Assistance - 24 hour     Equipment Recommendations  None recommended by PT    Recommendations for Other Services OT consult     Precautions / Restrictions Precautions Precautions: Fall Precaution Comments: pt reported she's had falls at home but only in the past month during illness Restrictions Weight Bearing Restrictions: No    Mobility  Bed Mobility Overal bed mobility: Needs Assistance Bed Mobility: Supine to Sit;Rolling Rolling: Supervision   Supine to sit: Supervision     General bed mobility comments: bed flat, verbal/manual cues for rolling to side then pushing up to sit  Transfers Overall transfer level: Needs assistance Equipment used: Rolling walker (2 wheeled) Transfers: Sit to/from Stand Sit to Stand: Min guard         General transfer comment: min/guard for safety, cues for hand placement  Ambulation/Gait   Ambulation Distance (Feet): 240 Feet Assistive device: Rolling walker (2 wheeled)   Gait velocity: decr Gait velocity interpretation: Below normal speed for age/gender General Gait Details: cues for posture as pt tends to have forward/flexed neck, SaO2 96-99% on RA walking, HR 122-132 walking, 2/4 dyspnea   Stairs            Wheelchair Mobility    Modified Rankin (Stroke Patients Only)       Balance     Sitting balance-Leahy Scale: Good       Standing balance-Leahy Scale: Poor                      Cognition Arousal/Alertness:  Awake/alert Behavior During Therapy: Flat affect Overall Cognitive Status: Within Functional Limits for tasks assessed                      Exercises      General Comments        Pertinent Vitals/Pain Pain Score: 9  Pain Location: LBP Pain Descriptors / Indicators: Sore Pain Intervention(s): Limited activity within patient's tolerance;Monitored during session;Premedicated before session    Home Living                      Prior Function            PT Goals (current goals can now be found in the care plan section) Acute Rehab PT Goals Patient Stated Goal: I need to get up and walk, to get strength back PT Goal Formulation: With patient Time For Goal Achievement: 08/27/15 Potential to Achieve Goals: Good Progress towards PT goals: Progressing toward goals    Frequency  Min 3X/week    PT Plan Current plan remains appropriate    Co-evaluation             End of Session Equipment Utilized During Treatment: Gait belt Activity Tolerance: Patient tolerated treatment well;Patient limited by fatigue;Patient limited by lethargy Patient left: with call bell/phone within reach;in chair;with chair alarm set     Time: 4098-1191 PT Time Calculation (min) (ACUTE ONLY): 17 min  Charges:  $Gait  Training: 8-22 mins                    G Codes:      Tamala Ser 08/15/2015, 10:41 AM [phone]

## 2015-08-15 NOTE — Clinical Social Work Placement (Signed)
   CLINICAL SOCIAL WORK PLACEMENT  NOTE  Date:  08/15/2015  Patient Details  Name: Nicole Fitzgerald MRN: [id number] Date of Birth: 1961/08/16  Clinical Social Work is seeking post-discharge placement for this patient at the Skilled  Nursing Facility level of care (*CSW will initial, date and re-position this form in  chart as items are completed):  Yes   Patient/family provided with [facility] Clinical Social Work Department's list of facilities offering this level of care within the geographic area requested by the patient (or if unable, by the patient's family).  Yes   Patient/family informed of their freedom to choose among providers that offer the needed level of care, that participate in Medicare, Medicaid or managed care program needed by the patient, have an available bed and are willing to accept the patient.  Yes   Patient/family informed of [facility]'s ownership interest in [facility] and [facility], as well as of the fact that they are under no obligation to receive care at these facilities.  PASRR submitted to EDS on 08/14/15     PASRR number received on 08/15/15     Existing PASRR number confirmed on       FL2 transmitted to all facilities in geographic area requested by pt/family on 08/15/15     FL2 transmitted to all facilities within larger geographic area on       Patient informed that his/her managed care company has contracts with or will negotiate with certain facilities, including the following:        Yes   Patient/family informed of bed offers received.  Patient chooses bed at [facility]     Physician recommends and patient chooses bed at      Patient to be transferred to [facility] on 08/15/15.  Patient to be transferred to facility by PTAR     Patient family notified on 08/15/15 of transfer.  Name of family member notified:  Son via phone     PHYSICIAN       Additional Comment:     _______________________________________________ Marnee Spring, LCSW 08/15/2015, 12:47 PM

## 2015-08-16 LAB — CULTURE, BLOOD (ROUTINE X 2)
Culture: NO GROWTH
Culture: NO GROWTH

## 2015-09-05 ENCOUNTER — Emergency Department (HOSPITAL_COMMUNITY): Payer: Medicare Other

## 2015-09-05 ENCOUNTER — Encounter (HOSPITAL_COMMUNITY): Payer: Self-pay | Admitting: *Deleted

## 2015-09-05 ENCOUNTER — Emergency Department (HOSPITAL_COMMUNITY)
Admission: EM | Admit: 2015-09-05 | Discharge: 2015-09-06 | Disposition: A | Discharge status: Home / Self Care | Payer: Medicare Other | Source: Home / Self Care | Attending: Emergency Medicine | Admitting: Emergency Medicine

## 2015-09-05 DIAGNOSIS — IMO0002 Reserved for concepts with insufficient information to code with codable children: Secondary | ICD-10-CM

## 2015-09-05 DIAGNOSIS — R229 Localized swelling, mass and lump, unspecified: Secondary | ICD-10-CM | POA: Diagnosis not present

## 2015-09-05 DIAGNOSIS — G061 Intraspinal abscess and granuloma: Secondary | ICD-10-CM | POA: Diagnosis not present

## 2015-09-05 DIAGNOSIS — M8440XA Pathological fracture, unspecified site, initial encounter for fracture: Secondary | ICD-10-CM

## 2015-09-05 HISTORY — DX: Schizophrenia, unspecified: F20.9

## 2015-09-05 LAB — BASIC METABOLIC PANEL
Anion gap: 8 (ref 5–15)
BUN: 6 mg/dL (ref 6–20)
CALCIUM: 8.4 mg/dL — AB (ref 8.9–10.3)
CHLORIDE: 101 mmol/L (ref 101–111)
CO2: 28 mmol/L (ref 22–32)
CREATININE: 0.64 mg/dL (ref 0.44–1.00)
GFR calc non Af Amer: >60 mL/min (ref >60)
GLUCOSE: 67 mg/dL (ref 65–99)
Potassium: 3.5 mmol/L (ref 3.5–5.1)
Sodium: 137 mmol/L (ref 135–145)

## 2015-09-05 LAB — CBC
HCT: 36.5 % (ref 36.0–46.0)
Hemoglobin: 11.9 g/dL — ABNORMAL LOW (ref 12.0–15.0)
MCH: 29 pg (ref 26.0–34.0)
MCHC: 32.6 g/dL (ref 30.0–36.0)
MCV: 89 fL (ref 78.0–100.0)
PLATELETS: 271 10*3/uL (ref 150–400)
RBC: 4.1 MIL/uL (ref 3.87–5.11)
RDW: 15.7 % — ABNORMAL HIGH (ref 11.5–15.5)
WBC: 9 10*3/uL (ref 4.0–10.5)

## 2015-09-05 LAB — I-STAT TROPONIN, ED: Troponin i, poc: 0.02 ng/mL (ref 0.00–0.08)

## 2015-09-05 LAB — D-DIMER, QUANTITATIVE: D-Dimer, Quant: 1.99 ug/mL-FEU — ABNORMAL HIGH (ref 0.00–0.48)

## 2015-09-05 MED ORDER — HYDROMORPHONE HCL 1 MG/ML IJ SOLN
1.0000 mg | Freq: Once | INTRAMUSCULAR | Status: AC
  Administered 2015-09-05: 1 mg via INTRAVENOUS
  Filled 2015-09-05: qty 1

## 2015-09-05 MED ORDER — FENTANYL CITRATE (PF) 100 MCG/2ML IJ SOLN
50.0000 ug | Freq: Once | INTRAMUSCULAR | Status: AC
  Administered 2015-09-05: 50 ug via INTRAVENOUS
  Filled 2015-09-05: qty 2

## 2015-09-05 MED ORDER — OXYCODONE-ACETAMINOPHEN 5-325 MG PO TABS: 1.0000 | ORAL_TABLET | Freq: Once | ORAL | Status: DC

## 2015-09-05 MED ORDER — IOHEXOL 350 MG/ML SOLN
100.0000 mL | Freq: Once | INTRAVENOUS | Status: AC | PRN
  Administered 2015-09-05: 100 mL via INTRAVENOUS

## 2015-09-05 NOTE — ED Notes (Signed)
Bed: JG81 Expected date:  Expected time:  Means of arrival:  Comments: Triage [facility]

## 2015-09-05 NOTE — ED Notes (Signed)
Unable to collect labs at this time patient is in xray 

## 2015-09-05 NOTE — ED Notes (Signed)
I attempted to collect labs twice and was unsuccessful.  I made the nurse aware. 

## 2015-09-05 NOTE — ED Notes (Signed)
Was only able to get enough blood for one tube. Used for i-stat d/t possibility of hemolysis if sent to main lab

## 2015-09-05 NOTE — ED Provider Notes (Signed)
CSN: [id number]     Arrival date & time 09/05/15  1405 History   First MD Initiated Contact with Patient 09/05/15 1823     Chief Complaint  Patient presents with  . Chest Pain     (Consider location/radiation/quality/duration/timing/severity/associated sxs/prior Treatment) HPI  Patient with 3 weeks of chronically worsening bilateral chest pain substernal chest pain after B diagnosis ammonia and being treated. She went to rehabilitation and was discharged back to her house week ago. Symptoms are worse with movement and worse with palpation. She had a fall recently and thought she might of broke some ribs. She also complains of some midline back pain around the T4-T5 area. She denies fevers, nausea, vomiting, chills, falls travels, surgeries. Made better with rest  Past Medical History  Diagnosis Date  . Crohn's disease   . Chronic back pain   . Prolonged QT interval 08/13/2015  . RBBB (right bundle branch block) 08/13/2015  . Schizophrenia    Past Surgical History  Procedure Laterality Date  . Cholecystectomy    . Abdominal hysterectomy    . Bowel resection     Family History  Problem Relation Age of Onset  . Hypertension Mother    Social History  Substance Use Topics  . Smoking status: Current Every Day Smoker    Types: Cigarettes  . Smokeless tobacco: None  . Alcohol Use: No   OB History    No data available     Review of Systems  Constitutional: Positive for appetite change and fatigue. Negative for fever and activity change.  HENT: Negative for congestion and facial swelling.   Eyes: Negative for discharge and redness.  Respiratory: Negative for cough and shortness of breath.   Cardiovascular: Positive for chest pain. Negative for palpitations.  Gastrointestinal: Negative for nausea, vomiting, abdominal pain and abdominal distention.  Endocrine: Negative for polydipsia and polyuria.  Genitourinary: Negative for dysuria and menstrual problem.  Musculoskeletal:  Positive for back pain. Negative for joint swelling.  Skin: Negative for color change and wound.  Neurological: Negative for dizziness, light-headedness and headaches.      Allergies  Aspirin  Home Medications   Prior to Admission medications   Medication Sig Start Date End Date Taking? Authorizing Provider  AMITIZA 24 MCG capsule Take 1 capsule by mouth 2 (two) times daily as needed. 07/13/15   Historical Provider, MD  benzonatate (TESSALON) 100 MG capsule Take 1 capsule (100 mg total) by mouth 3 (three) times daily as needed for cough. 08/15/15   Maryruth Bun Rama, MD  dextromethorphan (DELSYM) 30 MG/5ML liquid Take 5 mLs (30 mg total) by mouth 2 (two) times daily. 08/15/15   Maryruth Bun Rama, MD  dicyclomine (BENTYL) 20 MG tablet Take 1 tablet (20 mg total) by mouth every 6 (six) hours as needed for spasms (abdominal cramping). 08/15/15   Maryruth Bun Rama, MD  guaiFENesin (MUCINEX) 600 MG 12 hr tablet Take 1 tablet (600 mg total) by mouth 2 (two) times daily. 08/15/15   Maryruth Bun Rama, MD  haloperidol (HALDOL) 2 MG tablet Take 1 tablet by mouth daily. 06/16/15   Historical Provider, MD  hydrOXYzine (ATARAX/VISTARIL) 25 MG tablet Take 1 tablet (25 mg total) by mouth every 6 (six) hours as needed for anxiety. 08/15/15   Maryruth Bun Rama, MD  levocetirizine (XYZAL) 5 MG tablet Take 1 tablet by mouth daily. 07/13/15   Historical Provider, MD  LIALDA 1.2 G EC tablet Take 1 tablet by mouth 2 (two) times daily. 07/14/15  Historical Provider, MD  LIDODERM 5 % Place 1 patch onto the skin 2 (two) times daily. 07/13/15   Historical Provider, MD  NEXIUM 40 MG capsule Take 1 capsule by mouth 2 (two) times daily. 07/13/15   Historical Provider, MD  OLANZapine (ZYPREXA) 10 MG tablet Take 1 tablet by mouth daily. 07/13/15   Historical Provider, MD  OLANZapine (ZYPREXA) 20 MG tablet Take 1 tablet by mouth daily. 07/13/15   Historical Provider, MD  polyethylene glycol (MIRALAX / GLYCOLAX) packet Take 17 g by mouth  daily. 08/15/15   Maryruth Bun Rama, MD  potassium chloride SA (K-DUR,KLOR-CON) 20 MEQ tablet Take 1 tablet by mouth daily. 07/13/15   Historical Provider, MD  predniSONE (DELTASONE) 20 MG tablet Take 1 tablet by mouth daily. 07/01/15   Historical Provider, MD  PREMPRO 0.625-2.5 MG per tablet Take 1 tablet by mouth daily. 08/09/15   Historical Provider, MD  PROAIR HFA 108 (90 BASE) MCG/ACT inhaler Inhale 1 puff into the lungs every 4 (four) hours as needed. 07/13/15   Historical Provider, MD  promethazine (PHENERGAN) 12.5 MG tablet Take 1 tablet (12.5 mg total) by mouth every 4 (four) hours as needed for nausea or vomiting. 08/15/15   Maryruth Bun Rama, MD  traMADol (ULTRAM) 50 MG tablet Take 0.5 tablets (25 mg total) by mouth every 6 (six) hours as needed for severe pain. 08/15/15   Maryruth Bun Rama, MD  traZODone (DESYREL) 100 MG tablet Take 1 tablet by mouth 2 (two) times daily. 07/13/15   Historical Provider, MD  Prudy Feeler 1 MG tablet Take 1 tablet (1 mg total) by mouth 4 (four) times daily. 08/15/15   Christina P Rama, MD   BP 125/78 mmHg  Pulse 74  Temp(Src) 98.1 F (36.7 C) (Oral)  Resp 17  SpO2 98% Physical Exam  Constitutional: She is oriented to person, place, and time. She appears well-developed and well-nourished.  HENT:  Head: Normocephalic and atraumatic.  Eyes: Conjunctivae and EOM are normal. Right eye exhibits no discharge. Left eye exhibits no discharge.  Cardiovascular: Normal rate and regular rhythm.   Pulmonary/Chest: Effort normal and breath sounds normal. No respiratory distress.  Abdominal: Soft. She exhibits no distension. There is no tenderness. There is no rebound.  Musculoskeletal: Normal range of motion. She exhibits tenderness (anterior sternum). She exhibits no edema.  Neurological: She is alert and oriented to person, place, and time.  Skin: Skin is warm and dry.  Nursing note and vitals reviewed.   ED Course  Procedures (including critical care time) Labs Review Labs  Reviewed  BASIC METABOLIC PANEL  CBC  I-STAT TROPOININ, ED    Imaging Review Dg Chest 2 View  09/05/2015   CLINICAL DATA:  54 year old female with recurrent pneumonia like symptoms  EXAM: CHEST  2 VIEW  COMPARISON:  Prior chest x-ray 08/12/2015  FINDINGS: Developing linear opacities in the right lower lobe most consistent with areas of atelectasis or scarring. No focal airspace consolidation. Cardiac and mediastinal contours remain within normal limits. No pleural effusion or pneumothorax. No suspicious nodule or mass. No acute osseous abnormality.  IMPRESSION: Linear atelectasis versus scarring in the right lower lobe.   Electronically Signed   By: Malachy Moan M.D.   On: 09/05/2015 14:51   I have personally reviewed and evaluated these images and lab results as part of my medical decision-making.   EKG Interpretation   Date/Time:  Monday September 05 2015 14:11:26 EDT Ventricular Rate:  102 PR Interval:  126 QRS Duration: 124  QT Interval:  358 QTC Calculation: 466 R Axis:   0 Text Interpretation:  Sinus tachycardia Right bundle branch block  Anteroseptal infarct, age indeterminate No significant change since last  tracing Confirmed by [facility] MD, Barbara Cower [phone]) on 09/05/2015 6:39:51 PM      MDM   Final diagnoses:  Pathologic fracture, initial encounter  Mass   54 year old female with chest and back pain as above. Initial concern was for muscular skeletal pain such as fracture or full muscle. However d-dimer was elevated since CT scan was done which was read as having a likely new neoplasm around her spine. Delta troponin negative for ACS. ECG normal. No e/o other acute pathology on CT scan.   Pain was controlled in the emergency department and patient was instructed to follow-up with her primary doctor to get an MRI and to follow up with an oncologist from there. She was also instructed that if her pain got a lot worse or she had any new symptoms to return to the emergency  department immediately.  I have personally and contemperaneously reviewed labs and imaging and used in my decision making as above.   A medical screening exam was performed and I feel the patient has had an appropriate workup for their chief complaint at this time and likelihood of emergent condition existing is low. They have been counseled on decision, discharge, follow up and which symptoms necessitate immediate return to the emergency department. They or their family verbally stated understanding and agreement with plan and discharged in stable condition.      Marily Memos, MD 09/07/15 [phone]

## 2015-09-05 NOTE — Progress Notes (Signed)
EDCM spoke to patient at bedside.  Patient reports she lives with her son at home.  Patient reports she was admitted to the hospital for pneumonia and then went to rehab.  Patient reports she has private aide assistance through [employer].  Patient reports she is seen by her aide seven days a week for a little over three hours per day.  They assist her with meals, ADL's and cleaning.  Patient reports she does not have a pcp but is interested in establishing care with one of the female physicians she met while she was in the hospital.  Patient cannot remember the name of the doctor at this time.  Patient is interested in having RN and PT in the home.  Patient has a walker at home.  EDCM provided patient with list of home health agencies in [facility] and explained services.  Patient thankful for services.  No further EDCM needs at this time.  Awaiting disposition.

## 2015-09-05 NOTE — ED Notes (Signed)
Pt was recently admitted for PNA on 8/11, pt then went to rehab afterwards, reports since 8/11 she has had chest pain, reports she told hospital and rehab staff. Report chest pain now. 10/10. Reports SOB but able to speak in full sentences.

## 2015-09-05 NOTE — ED Notes (Signed)
Labs collected by Dr Clayborne Dana; blood clotted in tubes, shown to Dr Clayborne Dana

## 2015-09-06 ENCOUNTER — Emergency Department (HOSPITAL_COMMUNITY): Payer: Medicare Other

## 2015-09-06 ENCOUNTER — Inpatient Hospital Stay (HOSPITAL_COMMUNITY)
Admission: EM | Admit: 2015-09-06 | Discharge: 2015-09-12 | DRG: 095 | Disposition: A | Discharge status: Skilled Nursing Facility | Payer: Medicare Other | Attending: Internal Medicine | Admitting: Internal Medicine

## 2015-09-06 ENCOUNTER — Encounter (HOSPITAL_COMMUNITY): Payer: Self-pay | Admitting: Emergency Medicine

## 2015-09-06 DIAGNOSIS — IMO0002 Reserved for concepts with insufficient information to code with codable children: Secondary | ICD-10-CM

## 2015-09-06 DIAGNOSIS — G8929 Other chronic pain: Secondary | ICD-10-CM | POA: Diagnosis not present

## 2015-09-06 DIAGNOSIS — F1990 Other psychoactive substance use, unspecified, uncomplicated: Secondary | ICD-10-CM | POA: Diagnosis not present

## 2015-09-06 DIAGNOSIS — Z79891 Long term (current) use of opiate analgesic: Secondary | ICD-10-CM | POA: Diagnosis not present

## 2015-09-06 DIAGNOSIS — F2089 Other schizophrenia: Secondary | ICD-10-CM | POA: Diagnosis not present

## 2015-09-06 DIAGNOSIS — M545 Low back pain: Secondary | ICD-10-CM | POA: Diagnosis present

## 2015-09-06 DIAGNOSIS — M549 Dorsalgia, unspecified: Secondary | ICD-10-CM

## 2015-09-06 DIAGNOSIS — G061 Intraspinal abscess and granuloma: Secondary | ICD-10-CM | POA: Diagnosis present

## 2015-09-06 DIAGNOSIS — R894 Abnormal immunological findings in specimens from other organs, systems and tissues: Secondary | ICD-10-CM | POA: Diagnosis not present

## 2015-09-06 DIAGNOSIS — M4620 Osteomyelitis of vertebra, site unspecified: Secondary | ICD-10-CM

## 2015-09-06 DIAGNOSIS — M4624 Osteomyelitis of vertebra, thoracic region: Secondary | ICD-10-CM | POA: Diagnosis present

## 2015-09-06 DIAGNOSIS — K5090 Crohn's disease, unspecified, without complications: Secondary | ICD-10-CM | POA: Diagnosis present

## 2015-09-06 DIAGNOSIS — Z886 Allergy status to analgesic agent status: Secondary | ICD-10-CM

## 2015-09-06 DIAGNOSIS — M542 Cervicalgia: Secondary | ICD-10-CM | POA: Diagnosis present

## 2015-09-06 DIAGNOSIS — Z87891 Personal history of nicotine dependence: Secondary | ICD-10-CM

## 2015-09-06 DIAGNOSIS — B1920 Unspecified viral hepatitis C without hepatic coma: Secondary | ICD-10-CM | POA: Diagnosis not present

## 2015-09-06 DIAGNOSIS — Z7952 Long term (current) use of systemic steroids: Secondary | ICD-10-CM

## 2015-09-06 DIAGNOSIS — F209 Schizophrenia, unspecified: Secondary | ICD-10-CM | POA: Diagnosis present

## 2015-09-06 DIAGNOSIS — B9689 Other specified bacterial agents as the cause of diseases classified elsewhere: Secondary | ICD-10-CM | POA: Diagnosis not present

## 2015-09-06 DIAGNOSIS — M6008 Infective myositis, other site: Secondary | ICD-10-CM | POA: Diagnosis not present

## 2015-09-06 DIAGNOSIS — M8618 Other acute osteomyelitis, other site: Secondary | ICD-10-CM | POA: Diagnosis not present

## 2015-09-06 DIAGNOSIS — M869 Osteomyelitis, unspecified: Secondary | ICD-10-CM | POA: Diagnosis present

## 2015-09-06 DIAGNOSIS — R229 Localized swelling, mass and lump, unspecified: Secondary | ICD-10-CM

## 2015-09-06 DIAGNOSIS — B9561 Methicillin susceptible Staphylococcus aureus infection as the cause of diseases classified elsewhere: Secondary | ICD-10-CM | POA: Diagnosis present

## 2015-09-06 DIAGNOSIS — R768 Other specified abnormal immunological findings in serum: Secondary | ICD-10-CM | POA: Insufficient documentation

## 2015-09-06 DIAGNOSIS — M4644 Discitis, unspecified, thoracic region: Secondary | ICD-10-CM | POA: Diagnosis present

## 2015-09-06 DIAGNOSIS — M546 Pain in thoracic spine: Secondary | ICD-10-CM

## 2015-09-06 DIAGNOSIS — R079 Chest pain, unspecified: Secondary | ICD-10-CM | POA: Diagnosis not present

## 2015-09-06 DIAGNOSIS — B9562 Methicillin resistant Staphylococcus aureus infection as the cause of diseases classified elsewhere: Secondary | ICD-10-CM | POA: Diagnosis not present

## 2015-09-06 LAB — BASIC METABOLIC PANEL WITH GFR
Anion gap: 6 (ref 5–15)
BUN: <5 mg/dL — ABNORMAL LOW (ref 6–20)
CO2: 33 mmol/L — ABNORMAL HIGH (ref 22–32)
Calcium: 8.4 mg/dL — ABNORMAL LOW (ref 8.9–10.3)
Chloride: 97 mmol/L — ABNORMAL LOW (ref 101–111)
Creatinine, Ser: 0.66 mg/dL (ref 0.44–1.00)
GFR calc Af Amer: >60 mL/min
GFR calc non Af Amer: >60 mL/min
Glucose, Bld: 81 mg/dL (ref 65–99)
Potassium: 3.4 mmol/L — ABNORMAL LOW (ref 3.5–5.1)
Sodium: 136 mmol/L (ref 135–145)

## 2015-09-06 LAB — CBC
HCT: 37.1 % (ref 36.0–46.0)
Hemoglobin: 11.8 g/dL — ABNORMAL LOW (ref 12.0–15.0)
MCH: 28.1 pg (ref 26.0–34.0)
MCHC: 31.8 g/dL (ref 30.0–36.0)
MCV: 88.3 fL (ref 78.0–100.0)
Platelets: 337 K/uL (ref 150–400)
RBC: 4.2 MIL/uL (ref 3.87–5.11)
RDW: 15.7 % — ABNORMAL HIGH (ref 11.5–15.5)
WBC: 9.4 K/uL (ref 4.0–10.5)

## 2015-09-06 MED ORDER — OXYCODONE-ACETAMINOPHEN 5-325 MG PO TABS: 1.0000 | ORAL_TABLET | Freq: Three times a day (TID) | ORAL | Status: DC | PRN

## 2015-09-06 MED ORDER — ALBUTEROL SULFATE (2.5 MG/3ML) 0.083% IN NEBU: 2.5000 mg | INHALATION_SOLUTION | RESPIRATORY_TRACT | Status: DC | PRN

## 2015-09-06 MED ORDER — LEVOCETIRIZINE DIHYDROCHLORIDE 5 MG PO TABS: 5.0000 mg | ORAL_TABLET | Freq: Every day | ORAL | Status: DC

## 2015-09-06 MED ORDER — ALPRAZOLAM 0.5 MG PO TABS
1.0000 mg | ORAL_TABLET | Freq: Four times a day (QID) | ORAL | Status: DC
  Administered 2015-09-06 – 2015-09-12 (×22): 1 mg via ORAL
  Filled 2015-09-06 (×23): qty 2

## 2015-09-06 MED ORDER — TRAZODONE HCL 100 MG PO TABS
100.0000 mg | ORAL_TABLET | Freq: Two times a day (BID) | ORAL | Status: DC
  Administered 2015-09-06 – 2015-09-12 (×11): 100 mg via ORAL
  Filled 2015-09-06 (×12): qty 1

## 2015-09-06 MED ORDER — GADOBENATE DIMEGLUMINE 529 MG/ML IV SOLN
15.0000 mL | Freq: Once | INTRAVENOUS | Status: AC
Start: 2015-09-06 — End: 2015-09-06
  Administered 2015-09-06: 13 mL via INTRAVENOUS

## 2015-09-06 MED ORDER — LORATADINE 10 MG PO TABS
10.0000 mg | ORAL_TABLET | Freq: Every day | ORAL | Status: DC
  Administered 2015-09-06 – 2015-09-12 (×7): 10 mg via ORAL
  Filled 2015-09-06 (×7): qty 1

## 2015-09-06 MED ORDER — MORPHINE SULFATE (PF) 2 MG/ML IV SOLN
1.0000 mg | INTRAVENOUS | Status: DC | PRN
  Administered 2015-09-06 – 2015-09-07 (×4): 1 mg via INTRAVENOUS
  Filled 2015-09-06 (×4): qty 1

## 2015-09-06 MED ORDER — OXYCODONE-ACETAMINOPHEN 5-325 MG PO TABS
1.0000 | ORAL_TABLET | Freq: Three times a day (TID) | ORAL | Status: DC | PRN
  Administered 2015-09-07: 2 via ORAL
  Filled 2015-09-06: qty 2

## 2015-09-06 MED ORDER — HYDROXYZINE HCL 25 MG PO TABS: 25.0000 mg | ORAL_TABLET | Freq: Four times a day (QID) | ORAL | Status: DC | PRN

## 2015-09-06 MED ORDER — MORPHINE SULFATE (PF) 4 MG/ML IV SOLN
4.0000 mg | Freq: Once | INTRAVENOUS | Status: AC
  Administered 2015-09-06: 4 mg via INTRAVENOUS
  Filled 2015-09-06: qty 1

## 2015-09-06 MED ORDER — INFLUENZA VAC SPLIT QUAD 0.5 ML IM SUSY
0.5000 mL | PREFILLED_SYRINGE | INTRAMUSCULAR | Status: AC
  Administered 2015-09-07: 0.5 mL via INTRAMUSCULAR
  Filled 2015-09-06 (×2): qty 0.5

## 2015-09-06 MED ORDER — DICYCLOMINE HCL 20 MG PO TABS
20.0000 mg | ORAL_TABLET | Freq: Four times a day (QID) | ORAL | Status: DC | PRN
  Administered 2015-09-08: 20 mg via ORAL
  Filled 2015-09-06 (×3): qty 1

## 2015-09-06 MED ORDER — MESALAMINE 1.2 G PO TBEC
1.2000 g | DELAYED_RELEASE_TABLET | Freq: Two times a day (BID) | ORAL | Status: DC
  Administered 2015-09-06 – 2015-09-12 (×10): 1.2 g via ORAL
  Filled 2015-09-06 (×16): qty 1

## 2015-09-06 MED ORDER — TRAMADOL HCL 50 MG PO TABS
25.0000 mg | ORAL_TABLET | Freq: Four times a day (QID) | ORAL | Status: DC | PRN
  Administered 2015-09-07 – 2015-09-10 (×2): 25 mg via ORAL
  Filled 2015-09-06 (×3): qty 1

## 2015-09-06 MED ORDER — OLANZAPINE 10 MG PO TABS
10.0000 mg | ORAL_TABLET | Freq: Every day | ORAL | Status: DC
  Administered 2015-09-06 – 2015-09-11 (×4): 10 mg via ORAL
  Filled 2015-09-06 (×7): qty 1

## 2015-09-06 MED ORDER — GABAPENTIN 100 MG PO CAPS
100.0000 mg | ORAL_CAPSULE | Freq: Two times a day (BID) | ORAL | Status: DC
  Administered 2015-09-06 – 2015-09-12 (×10): 100 mg via ORAL
  Filled 2015-09-06 (×12): qty 1

## 2015-09-06 NOTE — Consult Note (Signed)
Reason for Consult: Thoracic osteomyelitis Referring Physician: Emergency department  Nicole Fitzgerald is an 54 y.o. female.  HPI: 54 year old female with history of Crohn's disease presents with worsening thoracic back pain. No symptoms of numbness, weakness,paresthesias or change in bowel or bladder habits. Patient hospitalized approximately 3 weeks ago with pneumonia. Patient states that she's been having back pain at least that long however the pain itself is worsening. MRI scan and CT scan demonstrated a destructive process at T3-T4 consistent with osteomyelitis with a paraspinal abscess. Patient is being admitted for further workup and appropriate IV antibody management.  Patient has a reported history of IV drug abuse.  Past Medical History  Diagnosis Date  . Crohn's disease   . Chronic back pain   . Prolonged QT interval 08/13/2015  . RBBB (right bundle branch block) 08/13/2015  . Schizophrenia     Past Surgical History  Procedure Laterality Date  . Cholecystectomy    . Abdominal hysterectomy    . Bowel resection      Family History  Problem Relation Age of Onset  . Hypertension Mother     Social History:  reports that she quit smoking about 3 weeks ago. Her smoking use included Cigarettes. She does not have any smokeless tobacco history on file. She reports that she does not drink alcohol or use illicit drugs.  Allergies:  Allergies  Allergen Reactions  . Aspirin Other (See Comments)    MD told not to take due to crohns    Medications: I have reviewed the patient's current medications.  Results for orders placed or performed during the hospital encounter of 09/06/15 (from the past 48 hour(s))  CBC     Status: Abnormal   Collection Time: 09/06/15 12:24 PM  Result Value Ref Range   WBC 9.4 4.0 - 10.5 K/uL   RBC 4.20 3.87 - 5.11 MIL/uL   Hemoglobin 11.8 (L) 12.0 - 15.0 g/dL   HCT 37.1 36.0 - 46.0 %   MCV 88.3 78.0 - 100.0 fL   MCH 28.1 26.0 - 34.0 pg   MCHC 31.8 30.0  - 36.0 g/dL   RDW 15.7 (H) 11.5 - 15.5 %   Platelets 337 150 - 400 K/uL  Basic metabolic panel     Status: Abnormal   Collection Time: 09/06/15 12:24 PM  Result Value Ref Range   Sodium 136 135 - 145 mmol/L   Potassium 3.4 (L) 3.5 - 5.1 mmol/L   Chloride 97 (L) 101 - 111 mmol/L   CO2 33 (H) 22 - 32 mmol/L   Glucose, Bld 81 65 - 99 mg/dL   BUN <5 (L) 6 - 20 mg/dL   Creatinine, Ser 0.66 0.44 - 1.00 mg/dL   Calcium 8.4 (L) 8.9 - 10.3 mg/dL   GFR calc non Af Amer >60 >60 mL/min   GFR calc Af Amer >60 >60 mL/min    Comment: (NOTE) The eGFR has been calculated using the CKD EPI equation. This calculation has not been validated in all clinical situations. eGFR's persistently <60 mL/min signify possible Chronic Kidney Disease.    Anion gap 6 5 - 15    Dg Chest 2 View  09/05/2015   CLINICAL DATA:  54 year old female with recurrent pneumonia like symptoms  EXAM: CHEST  2 VIEW  COMPARISON:  Prior chest x-ray 08/12/2015  FINDINGS: Developing linear opacities in the right lower lobe most consistent with areas of atelectasis or scarring. No focal airspace consolidation. Cardiac and mediastinal contours remain within normal limits. No pleural  effusion or pneumothorax. No suspicious nodule or mass. No acute osseous abnormality.  IMPRESSION: Linear atelectasis versus scarring in the right lower lobe.   Electronically Signed   By: Jacqulynn Cadet M.D.   On: 09/05/2015 14:51   Ct Angio Chest Pe W/cm &/or Wo Cm  09/05/2015   CLINICAL DATA:  54 year old female with pain across the upper chest and shortness of breath  EXAM: CT ANGIOGRAPHY CHEST WITH CONTRAST  TECHNIQUE: Multidetector CT imaging of the chest was performed using the standard protocol during bolus administration of intravenous contrast. Multiplanar CT image reconstructions and MIPs were obtained to evaluate the vascular anatomy.  CONTRAST:  146m OMNIPAQUE IOHEXOL 350 MG/ML SOLN  COMPARISON:  Chest radiograph dated 09/05/2015  FINDINGS:  Evaluation of this exam is limited due to respiratory motion artifact.  There is linea atelectasis/scarring in the lingula. There is partial consolidation of the right middle lobe possibly atelectasis versus pneumonia. Right lower lobe atelectatic changes noted. There is no pleural effusion or pneumothorax. The central airways are patent.  The visualized thoracic aorta appears unremarkable. No CT evidence of pulmonary embolism. There is no cardiomegaly or pericardial effusion. There is no hilar adenopathy. Top-normal upper mediastinal lymph nodes noted. The thyroid gland appears unremarkable.  There is a bilobed appearing paraspinal soft tissue mass at T3-T4 most compatible with a neoplasm. The right lobe of the mass measures approximately 3.3 x 2.0 cm and the left lobe of the lesion measures approximately 2.9 x 1.2 cm. There is associated destructive changes and fracture of the inferior endplate of the T3 and superior endplate of T4 compatible with pathologic fracture. Differential includes a neurogenic tumor, non neurogenic tumors such as chondroid or osseous neoplasm, lymphoma. Other infectious process is not excluded. Clinical correlation is recommended. MRI without and with contrast is recommended for further characterization.  The visualized upper abdomen is unremarkable.  Review of the MIP images confirms the above findings.  IMPRESSION: No CT evidence of pulmonary embolism.  Bilobed paraspinal soft tissue mass centered at T3-T4 with mild destructive changes and pathologic fracture of the adjacent vertebra most compatible with neoplastic process. MRI is recommended for further characterization.   Electronically Signed   By: AAnner CreteM.D.   On: 09/05/2015 23:44   Mr Thoracic Spine W Wo Contrast  09/06/2015   CLINICAL DATA:  Two week history of cough and shortness of breath. Stabbing pain in the thoracic spinal region with inspiration and cough. Abnormal CT at T3-4  EXAM: MRI THORACIC SPINE WITHOUT  AND WITH CONTRAST  TECHNIQUE: Multiplanar and multiecho pulse sequences of the thoracic spine were obtained without and with intravenous contrast.  CONTRAST:  147mMULTIHANCE GADOBENATE DIMEGLUMINE 529 MG/ML IV SOLN  COMPARISON:  CT chest 09/05/2015  FINDINGS: Scout views in the cervical regions show degenerative spondylosis most pronounced at T5-6 and T6-7.  In the thoracic region, there is no abnormal process centered at the T3-4 disc space with involvement of the T3 and T4 vertebral bodies and paravertebral soft tissues. The findings are most consistent with discitis osteomyelitis at this level. There is thickening and enhancement of the epidural tissues, resulting in constriction of the thecal sac and slight displacement and deformity of the cord. No definite abnormal cord signal. One could question early abnormal T2 signal. Paravertebral material is largely enhancing, though there is some nonenhancing fluid on the right consistent with abscess.  No involvement elsewhere in the thoracic region is identified. There is degenerative disc disease at T11-12 and T12-L1.  IMPRESSION:  Acute abnormal process at the T3-4 level most consistent with T3-4 discitis with osteomyelitis of the T3 and T4 vertebrae. Paravertebral phlegmonous inflammation with early abscess formation on the right. Epidural phlegmonous inflammation with constriction of the thecal sac and slight deformity and displacement of the cord. Question early abnormal T2 signal in the cord. The possibility of tumor is considered but felt less likely.  Critical Value/emergent results were called by telephone at the time of interpretation on 09/06/2015 at 3:22 pm to [facility] , who verbally acknowledged these results.   Electronically Signed   By: Nelson Chimes M.D.   On: 09/06/2015 15:27    Pertinent items are noted in HPI. Blood pressure 147/90, pulse 85, temperature 98.7 F (37.1 C), temperature source Oral, resp. rate 18, height '5\' 6"'  (1.676 m),  weight 21.773 kg (48 lb), SpO2 98 %. The patient is awake and alert. She is obviously uncomfortable. She does not appear to be any distress. Examination head ears eyes and throat is unremarkable aside from very poor dentition. Examination her cervical spine reveals a full active range of motion without rigidity. Examination her upper thoracic spine demonstrates some tenderness and spasm. Examination neurologically finds her be awake and alert, oriented and appropriate. Cranial nerve function is intact. Motor and sensory function of her extremities are normal. Reflexes are normal. Toes are equivocal to plantar stimulation.  Assessment/Plan: T3/T4 osteomyelitis discitis with paraspinal abscess. There is certainly a component of epidural abscess as well. Currently the patient is neurologically intact. I recommend percutaneous aspiration by radiology and starting broad-spectrum antibodies. Patient should be monitored closely for signs of neurologic change. Currently no indications for emergent surgical evacuation as the epidural component appears to be more of a chronic phlegmon in appearance rather than any liquefied abscess.  Nicole Fitzgerald A 09/06/2015, 4:58 PM

## 2015-09-06 NOTE — ED Provider Notes (Signed)
CSN: [id number]     Arrival date & time 09/06/15  0935 History   First MD Initiated Contact with Patient 09/06/15 1104     Chief Complaint  Patient presents with  . Back Pain     (Consider location/radiation/quality/duration/timing/severity/associated sxs/prior Treatment) HPI Comments: Christean Silvestri is a 54 y.o F with a past medical history of schizophrenia, Crohn's disease, polysubstance abuse who presents to the emergency department today for an MRI of her back. Patient states that she was seen at [facility] yesterday for back pain. Patient had a CT of her chest done which found a mass on her T3-T4 vertebrae most consistent with a neoplastic process. MRI was recommended at that time however, MRI was not available at that hour at [facility]. Patient was recommended to follow up outpatient with an MRI however, patient states that her pain is unbearable and she needed to be seen sooner. Pain is 10 out of 10. Nonradiating. Denies bowel or bladder incontinence, saddle anesthesia, dysuria, weakness, numbness or tingling, chest pain, shortness of breath, fevers, chills. Pt admits to using IV drugs, last use 2 months ago.  Patient is a 54 y.o. female presenting with back pain. The history is provided by the patient.  Back Pain   Past Medical History  Diagnosis Date  . Crohn's disease   . Chronic back pain   . Prolonged QT interval 08/13/2015  . RBBB (right bundle branch block) 08/13/2015  . Schizophrenia    Past Surgical History  Procedure Laterality Date  . Cholecystectomy    . Abdominal hysterectomy    . Bowel resection     Family History  Problem Relation Age of Onset  . Hypertension Mother    Social History  Substance Use Topics  . Smoking status: Former Smoker    Types: Cigarettes    Quit date: 08/16/2015  . Smokeless tobacco: None  . Alcohol Use: No   OB History    No data available     Review of Systems  Musculoskeletal: Positive for back pain.  All other systems reviewed  and are negative.     Allergies  Aspirin  Home Medications   Prior to Admission medications   Medication Sig Start Date End Date Taking? Authorizing Provider  AMITIZA 24 MCG capsule Take 24 mcg by mouth 2 (two) times daily as needed for constipation.  07/13/15  Yes Historical Provider, MD  dicyclomine (BENTYL) 20 MG tablet Take 1 tablet (20 mg total) by mouth every 6 (six) hours as needed for spasms (abdominal cramping). 08/15/15  Yes Christina P Rama, MD  gabapentin (NEURONTIN) 100 MG capsule Take 100 mg by mouth every 12 (twelve) hours. 08/29/15  Yes Historical Provider, MD  hydrOXYzine (ATARAX/VISTARIL) 25 MG tablet Take 1 tablet (25 mg total) by mouth every 6 (six) hours as needed for anxiety. 08/15/15  Yes Christina P Rama, MD  levocetirizine (XYZAL) 5 MG tablet Take 5 mg by mouth daily.  07/13/15  Yes Historical Provider, MD  LIALDA 1.2 G EC tablet Take 1.2 g by mouth 2 (two) times daily.  07/14/15  Yes Historical Provider, MD  LIDODERM 5 % Place 1 patch onto the skin 2 (two) times daily. 07/13/15  Yes Historical Provider, MD  methylphenidate (RITALIN) 20 MG tablet Take 20 mg by mouth 3 (three) times daily. 08/19/15  Yes Historical Provider, MD  NEXIUM 40 MG capsule Take 40 mg by mouth 2 (two) times daily.  07/13/15  Yes Historical Provider, MD  OLANZapine (ZYPREXA) 10 MG  tablet Take 10 mg by mouth at bedtime.  07/13/15  Yes Historical Provider, MD  OLANZapine (ZYPREXA) 20 MG tablet Take 20 mg by mouth daily.  07/13/15  Yes Historical Provider, MD  oxyCODONE-acetaminophen (PERCOCET/ROXICET) 5-325 MG per tablet Take 1-2 tablets by mouth every 8 (eight) hours as needed (back pain). 09/06/15  Yes Marily Memos, MD  potassium chloride SA (K-DUR,KLOR-CON) 20 MEQ tablet Take 20 mEq by mouth daily.  07/13/15  Yes Historical Provider, MD  predniSONE (DELTASONE) 20 MG tablet Take 20 mg by mouth daily.  07/01/15  Yes Historical Provider, MD  PREMPRO 0.625-2.5 MG per tablet Take 1 tablet by mouth daily. 08/09/15   Yes Historical Provider, MD  PROAIR HFA 108 (90 BASE) MCG/ACT inhaler Inhale 1 puff into the lungs every 4 (four) hours as needed for wheezing or shortness of breath.  07/13/15  Yes Historical Provider, MD  traMADol (ULTRAM) 50 MG tablet Take 0.5 tablets (25 mg total) by mouth every 6 (six) hours as needed for severe pain. 08/15/15  Yes Maryruth Bun Rama, MD  traZODone (DESYREL) 100 MG tablet Take 100 mg by mouth 2 (two) times daily.  07/13/15  Yes Historical Provider, MD  Prudy Feeler 1 MG tablet Take 1 tablet (1 mg total) by mouth 4 (four) times daily. 08/15/15  Yes Christina P Rama, MD   BP 154/88 mmHg  Pulse 84  Temp(Src) 98.7 F (37.1 C) (Oral)  Resp 18  Ht 5\' 6"  (1.676 m)  Wt 48 lb (21.773 kg)  BMI 7.75 kg/m2  SpO2 99% Physical Exam  Constitutional: She is oriented to person, place, and time. She appears well-developed and well-nourished. No distress.  HENT:  Head: Normocephalic and atraumatic.  Mouth/Throat: Oropharynx is clear and moist. No oropharyngeal exudate.  Eyes: Conjunctivae and EOM are normal. Pupils are equal, round, and reactive to light. Right eye exhibits no discharge. Left eye exhibits no discharge. No scleral icterus.  Cardiovascular: Normal rate, regular rhythm, normal heart sounds and intact distal pulses.  Exam reveals no gallop and no friction rub.   No murmur heard. Pulmonary/Chest: Effort normal and breath sounds normal. No respiratory distress. She has no wheezes. She has no rales. She exhibits no tenderness.  Abdominal: Soft. Bowel sounds are normal. She exhibits no distension and no mass. There is no tenderness. There is no rebound and no guarding.  Musculoskeletal: Normal range of motion. She exhibits tenderness. She exhibits no edema.  Midline spinal tenderness of thoracic spine  Lymphadenopathy:    She has no cervical adenopathy.  Neurological: She is alert and oriented to person, place, and time.  Strength 5/5 throughout. No sensory deficits.    Skin: Skin is  warm and dry. No rash noted. She is not diaphoretic. No erythema. No pallor.  Psychiatric: She has a normal mood and affect. Her behavior is normal.  Nursing note and vitals reviewed.   ED Course  Procedures (including critical care time)  Spoke with radiology who revealed the results of the MRI as osteomyelitis with paraspinal abscess formation impinging on spinal cord  CRITICAL CARE Performed by: Dub Mikes   Total critical care time: 1.5hours  Critical care time was exclusive of separately billable procedures and treating other patients.  Critical care was necessary to treat or prevent imminent or life-threatening deterioration.  Critical care was time spent personally by me on the following activities: development of treatment plan with patient and/or surrogate as well as nursing, discussions with consultants, evaluation of patient's response to treatment, examination  of patient, obtaining history from patient or surrogate, ordering and performing treatments and interventions, ordering and review of laboratory studies, ordering and review of radiographic studies, pulse oximetry and re-evaluation of patient's condition.  MRI w/wo contrast ordered  3:40 PM Spoke with ID who recommend holding antibiotics until IR determines if it can be drained. Will consult IR  Consult to neurosurgery ordered Blood cultures ordered  4:01 PM Spoke with Dr. Jordan Likes with neurosurgery who recommend admission to medicine. Dr. Jordan Likes will consult pt in ED.   4:01 PM Spoke with IR who will consult pt in ED.   Spoke with [facility] hospitalist who will admit pt to their service.  Labs Review Labs Reviewed  CBC - Abnormal; Notable for the following:    Hemoglobin 11.8 (*)    RDW 15.7 (*)    All other components within normal limits  BASIC METABOLIC PANEL - Abnormal; Notable for the following:    Potassium 3.4 (*)    Chloride 97 (*)    CO2 33 (*)    BUN <5 (*)    Calcium 8.4 (*)    All other  components within normal limits  CULTURE, BLOOD (ROUTINE X 2)  CULTURE, BLOOD (ROUTINE X 2)    Imaging Review Dg Chest 2 View  09/05/2015   CLINICAL DATA:  54 year old female with recurrent pneumonia like symptoms  EXAM: CHEST  2 VIEW  COMPARISON:  Prior chest x-ray 08/12/2015  FINDINGS: Developing linear opacities in the right lower lobe most consistent with areas of atelectasis or scarring. No focal airspace consolidation. Cardiac and mediastinal contours remain within normal limits. No pleural effusion or pneumothorax. No suspicious nodule or mass. No acute osseous abnormality.  IMPRESSION: Linear atelectasis versus scarring in the right lower lobe.   Electronically Signed   By: Malachy Moan M.D.   On: 09/05/2015 14:51   Ct Angio Chest Pe W/cm &/or Wo Cm  09/05/2015   CLINICAL DATA:  54 year old female with pain across the upper chest and shortness of breath  EXAM: CT ANGIOGRAPHY CHEST WITH CONTRAST  TECHNIQUE: Multidetector CT imaging of the chest was performed using the standard protocol during bolus administration of intravenous contrast. Multiplanar CT image reconstructions and MIPs were obtained to evaluate the vascular anatomy.  CONTRAST:  OMNIPAQUE IOHEXOL 350 MG/ML SOLN  COMPARISON:  Chest radiograph dated 09/05/2015  FINDINGS: Evaluation of this exam is limited due to respiratory motion artifact.  There is linea atelectasis/scarring in the lingula. There is partial consolidation of the right middle lobe possibly atelectasis versus pneumonia. Right lower lobe atelectatic changes noted. There is no pleural effusion or pneumothorax. The central airways are patent.  The visualized thoracic aorta appears unremarkable. No CT evidence of pulmonary embolism. There is no cardiomegaly or pericardial effusion. There is no hilar adenopathy. Top-normal upper mediastinal lymph nodes noted. The thyroid gland appears unremarkable.  There is a bilobed appearing paraspinal soft tissue mass at T3-T4 most  compatible with a neoplasm. The right lobe of the mass measures approximately 3.3 x 2.0 cm and the left lobe of the lesion measures approximately 2.9 x 1.2 cm. There is associated destructive changes and fracture of the inferior endplate of the T3 and superior endplate of T4 compatible with pathologic fracture. Differential includes a neurogenic tumor, non neurogenic tumors such as chondroid or osseous neoplasm, lymphoma. Other infectious process is not excluded. Clinical correlation is recommended. MRI without and with contrast is recommended for further characterization.  The visualized upper abdomen is unremarkable.  Review  of the MIP images confirms the above findings.  IMPRESSION: No CT evidence of pulmonary embolism.  Bilobed paraspinal soft tissue mass centered at T3-T4 with mild destructive changes and pathologic fracture of the adjacent vertebra most compatible with neoplastic process. MRI is recommended for further characterization.   Electronically Signed   By: Elgie Collard M.D.   On: 09/05/2015 23:44   Mr Thoracic Spine W Wo Contrast  09/06/2015   CLINICAL DATA:  Two week history of cough and shortness of breath. Stabbing pain in the thoracic spinal region with inspiration and cough. Abnormal CT at T3-4  EXAM: MRI THORACIC SPINE WITHOUT AND WITH CONTRAST  TECHNIQUE: Multiplanar and multiecho pulse sequences of the thoracic spine were obtained without and with intravenous contrast.  CONTRAST:  13mL MULTIHANCE GADOBENATE DIMEGLUMINE 529 MG/ML IV SOLN  COMPARISON:  CT chest 09/05/2015  FINDINGS: Scout views in the cervical regions show degenerative spondylosis most pronounced at T5-6 and T6-7.  In the thoracic region, there is no abnormal process centered at the T3-4 disc space with involvement of the T3 and T4 vertebral bodies and paravertebral soft tissues. The findings are most consistent with discitis osteomyelitis at this level. There is thickening and enhancement of the epidural tissues,  resulting in constriction of the thecal sac and slight displacement and deformity of the cord. No definite abnormal cord signal. One could question early abnormal T2 signal. Paravertebral material is largely enhancing, though there is some nonenhancing fluid on the right consistent with abscess.  No involvement elsewhere in the thoracic region is identified. There is degenerative disc disease at T11-12 and T12-L1.  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 Paravertebral phlegmonous inflammation with early abscess formation on the right. Epidural phlegmonous inflammation with constriction of the thecal sac and slight deformity and displacement of the cord. Question early abnormal T2 signal in the cord. The possibility of tumor is considered but felt less likely.  Critical Value/emergent results were called by telephone at the time of interpretation on 09/06/2015 at 3:22 pm to [facility] , who verbally acknowledged these results.   Electronically Signed   By: Paulina Fusi M.D.   On: 09/06/2015 15:27   I have personally reviewed and evaluated these images and lab results as part of my medical decision-making.   EKG Interpretation None      MDM   Final diagnoses:  Mass  Osteomyelitis  Paraspinal abscess    Pt seen for back pain. CTA yesterday read as most compatible with neoplastic process. Follow up MRI performed today, revealed: osteomyelitis of T3-T4 vertebrae with early abscess formation causing constriction of the thecal sac and slight deformity and displacement of the spinal cord. Spoke with ID who recommended withholding antibiotics until interventional radiology can consult pt. Spoke with Dr. Dutch Quint in neurosurgery who recommends admission to medicine and will consult patient. [facility] hospitalist agreed to admit patient to his service. Patient will remain nothing by mouth.   Patient was discussed with  and seen by Dr. Gwendolyn Grant who agrees with the treatment plan.      Lester Kinsman [place], PA-C 09/06/15 1752  Elwin Mocha, MD 09/07/15 [phone]

## 2015-09-06 NOTE — H&P (Signed)
History and Physical  Nicole Fitzgerald GNF:[id number] DOB: [birth year] DOA: 09/06/2015  Referring physician: EDP PCP: No primary care provider on file.   Chief Complaint: back pain  HPI: Nicole Fitzgerald is a 54 y.o. female  H/o IV drug user reported quit "cold [place] two months ago" , h/o recent hospitalization at [facility] long for community acquired pneumonia Presented to the ED with lower back pain.  She also has a history of  schizophrenia, Crohn's disease,  Patient states that she was seen at [facility] yesterday for back pain. Patient had a CT of her chest done which found a mass on her T3-T4 vertebrae most consistent with a neoplastic process. MRI was recommended at that time however, MRI was not available at that hour at [facility]. Patient was recommended to follow up outpatient with an MRI however, patient states that her pain is unbearable and she needed to be seen sooner. Pain is 10 out of 10. Nonradiating. Denies bowel or bladder incontinence, saddle anesthesia, dysuria, weakness, numbness or tingling, chest pain, shortness of breath, fevers, chills.   Review of Systems:  Detail per HPI, Review of systems are otherwise negative  Past Medical History  Diagnosis Date  . Crohn's disease   . Chronic back pain   . Prolonged QT interval 08/13/2015  . RBBB (right bundle branch block) 08/13/2015  . Schizophrenia    Past Surgical History  Procedure Laterality Date  . Cholecystectomy    . Abdominal hysterectomy    . Bowel resection     Social History:  reports that she quit smoking about 3 weeks ago. Her smoking use included Cigarettes. She does not have any smokeless tobacco history on file. She reports that she does not drink alcohol or use illicit drugs. Patient lives at home with her son & is able to participate in activities of daily living independently   Allergies  Allergen Reactions  . Aspirin Other (See Comments)    MD told not to take due to crohns    Family History  Problem  Relation Age of Onset  . Hypertension Mother   .         Prior to Admission medications   Medication Sig Start Date End Date Taking? Authorizing Provider  AMITIZA 24 MCG capsule Take 24 mcg by mouth 2 (two) times daily as needed for constipation.  07/13/15  Yes Historical Provider, MD  dicyclomine (BENTYL) 20 MG tablet Take 1 tablet (20 mg total) by mouth every 6 (six) hours as needed for spasms (abdominal cramping). 08/15/15  Yes Christina P Rama, MD  gabapentin (NEURONTIN) 100 MG capsule Take 100 mg by mouth every 12 (twelve) hours. 08/29/15  Yes Historical Provider, MD  hydrOXYzine (ATARAX/VISTARIL) 25 MG tablet Take 1 tablet (25 mg total) by mouth every 6 (six) hours as needed for anxiety. 08/15/15  Yes Christina P Rama, MD  levocetirizine (XYZAL) 5 MG tablet Take 5 mg by mouth daily.  07/13/15  Yes Historical Provider, MD  LIALDA 1.2 G EC tablet Take 1.2 g by mouth 2 (two) times daily.  07/14/15  Yes Historical Provider, MD  LIDODERM 5 % Place 1 patch onto the skin 2 (two) times daily. 07/13/15  Yes Historical Provider, MD  methylphenidate (RITALIN) 20 MG tablet Take 20 mg by mouth 3 (three) times daily. 08/19/15  Yes Historical Provider, MD  NEXIUM 40 MG capsule Take 40 mg by mouth 2 (two) times daily.  07/13/15  Yes Historical Provider, MD  OLANZapine (ZYPREXA) 10 MG tablet Take  10 mg by mouth at bedtime.  07/13/15  Yes Historical Provider, MD  OLANZapine (ZYPREXA) 20 MG tablet Take 20 mg by mouth daily.  07/13/15  Yes Historical Provider, MD  oxyCODONE-acetaminophen (PERCOCET/ROXICET) 5-325 MG per tablet Take 1-2 tablets by mouth every 8 (eight) hours as needed (back pain). 09/06/15  Yes Marily Memos, MD  potassium chloride SA (K-DUR,KLOR-CON) 20 MEQ tablet Take 20 mEq by mouth daily.  07/13/15  Yes Historical Provider, MD  predniSONE (DELTASONE) 20 MG tablet Take 20 mg by mouth daily.  07/01/15  Yes Historical Provider, MD  PREMPRO 0.625-2.5 MG per tablet Take 1 tablet by mouth daily. 08/09/15  Yes  Historical Provider, MD  PROAIR HFA 108 (90 BASE) MCG/ACT inhaler Inhale 1 puff into the lungs every 4 (four) hours as needed for wheezing or shortness of breath.  07/13/15  Yes Historical Provider, MD  traMADol (ULTRAM) 50 MG tablet Take 0.5 tablets (25 mg total) by mouth every 6 (six) hours as needed for severe pain. 08/15/15  Yes Maryruth Bun Rama, MD  traZODone (DESYREL) 100 MG tablet Take 100 mg by mouth 2 (two) times daily.  07/13/15  Yes Historical Provider, MD  Prudy Feeler 1 MG tablet Take 1 tablet (1 mg total) by mouth 4 (four) times daily. 08/15/15  Yes Maryruth Bun Rama, MD    Physical Exam: BP 141/77 mmHg  Pulse 76  Temp(Src) 98.7 F (37.1 C) (Oral)  Resp 14  Ht 5\' 6"  (1.676 m)  Wt 48 lb (21.773 kg)  BMI 7.75 kg/m2  SpO2 97%  General:  Moderate distress due to back pain Eyes: PERRL ENT: unremarkable Neck: supple, no JVD Cardiovascular: RRR Respiratory: CTABL Abdomen: soft/ND/ND, positive bowel sounds Skin: no rash Musculoskeletal:  Tender to palpation spine mid back, No lower extremity edema Psychiatric: calm/cooperative Neurologic: no focal findings            Labs on Admission:  Basic Metabolic Panel:  Recent Labs Lab 09/05/15 2024 09/06/15 1224  NA 137 136  K 3.5 3.4*  CL 101 97*  CO2 28 33*  GLUCOSE 67 81  BUN 6 <5*  CREATININE 0.64 0.66  CALCIUM 8.4* 8.4*   Liver Function Tests: No results for input(s): AST, ALT, ALKPHOS, BILITOT, PROT, ALBUMIN in the last 168 hours. No results for input(s): LIPASE, AMYLASE in the last 168 hours. No results for input(s): AMMONIA in the last 168 hours. CBC:  Recent Labs Lab 09/05/15 2024 09/06/15 1224  WBC 9.0 9.4  HGB 11.9* 11.8*  HCT 36.5 37.1  MCV 89.0 88.3  PLT 271 337   Cardiac Enzymes: No results for input(s): CKTOTAL, CKMB, CKMBINDEX, TROPONINI in the last 168 hours.  BNP (last 3 results) No results for input(s): BNP in the last 8760 hours.  ProBNP (last 3 results) No results for input(s): PROBNP in  the last 8760 hours.  CBG: No results for input(s): GLUCAP in the last 168 hours.  Radiological Exams on Admission: Dg Chest 2 View  09/05/2015   CLINICAL DATA:  54 year old female with recurrent pneumonia like symptoms  EXAM: CHEST  2 VIEW  COMPARISON:  Prior chest x-ray 08/12/2015  FINDINGS: Developing linear opacities in the right lower lobe most consistent with areas of atelectasis or scarring. No focal airspace consolidation. Cardiac and mediastinal contours remain within normal limits. No pleural effusion or pneumothorax. No suspicious nodule or mass. No acute osseous abnormality.  IMPRESSION: Linear atelectasis versus scarring in the right lower lobe.   Electronically Signed   By: Vilma Prader  Archer Asa M.D.   On: 09/05/2015 14:51   Ct Angio Chest Pe W/cm &/or Wo Cm  09/05/2015   CLINICAL DATA:  54 year old female with pain across the upper chest and shortness of breath  EXAM: CT ANGIOGRAPHY CHEST WITH CONTRAST  TECHNIQUE: Multidetector CT imaging of the chest was performed using the standard protocol during bolus administration of intravenous contrast. Multiplanar CT image reconstructions and MIPs were obtained to evaluate the vascular anatomy.  CONTRAST:  OMNIPAQUE IOHEXOL 350 MG/ML SOLN  COMPARISON:  Chest radiograph dated 09/05/2015  FINDINGS: Evaluation of this exam is limited due to respiratory motion artifact.  There is linea atelectasis/scarring in the lingula. There is partial consolidation of the right middle lobe possibly atelectasis versus pneumonia. Right lower lobe atelectatic changes noted. There is no pleural effusion or pneumothorax. The central airways are patent.  The visualized thoracic aorta appears unremarkable. No CT evidence of pulmonary embolism. There is no cardiomegaly or pericardial effusion. There is no hilar adenopathy. Top-normal upper mediastinal lymph nodes noted. The thyroid gland appears unremarkable.  There is a bilobed appearing paraspinal soft tissue mass at T3-T4  most compatible with a neoplasm. The right lobe of the mass measures approximately 3.3 x 2.0 cm and the left lobe of the lesion measures approximately 2.9 x 1.2 cm. There is associated destructive changes and fracture of the inferior endplate of the T3 and superior endplate of T4 compatible with pathologic fracture. Differential includes a neurogenic tumor, non neurogenic tumors such as chondroid or osseous neoplasm, lymphoma. Other infectious process is not excluded. Clinical correlation is recommended. MRI without and with contrast is recommended for further characterization.  The visualized upper abdomen is unremarkable.  Review of the MIP images confirms the above findings.  IMPRESSION: No CT evidence of pulmonary embolism.  Bilobed paraspinal soft tissue mass centered at T3-T4 with mild destructive changes and pathologic fracture of the adjacent vertebra most compatible with neoplastic process. MRI is recommended for further characterization.   Electronically Signed   By: Elgie Collard M.D.   On: 09/05/2015 23:44   Mr Thoracic Spine W Wo Contrast  09/06/2015   CLINICAL DATA:  Two week history of cough and shortness of breath. Stabbing pain in the thoracic spinal region with inspiration and cough. Abnormal CT at T3-4  EXAM: MRI THORACIC SPINE WITHOUT AND WITH CONTRAST  TECHNIQUE: Multiplanar and multiecho pulse sequences of the thoracic spine were obtained without and with intravenous contrast.  CONTRAST:  13mL MULTIHANCE GADOBENATE DIMEGLUMINE 529 MG/ML IV SOLN  COMPARISON:  CT chest 09/05/2015  FINDINGS: Scout views in the cervical regions show degenerative spondylosis most pronounced at T5-6 and T6-7.  In the thoracic region, there is no abnormal process centered at the T3-4 disc space with involvement of the T3 and T4 vertebral bodies and paravertebral soft tissues. The findings are most consistent with discitis osteomyelitis at this level. There is thickening and enhancement of the epidural tissues,  resulting in constriction of the thecal sac and slight displacement and deformity of the cord. No definite abnormal cord signal. One could question early abnormal T2 signal. Paravertebral material is largely enhancing, though there is some nonenhancing fluid on the right consistent with abscess.  No involvement elsewhere in the thoracic region is identified. There is degenerative disc disease at T11-12 and T12-L1.  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 Paravertebral phlegmonous inflammation with early abscess formation on the right. Epidural phlegmonous  inflammation with constriction of the thecal sac and slight deformity and displacement of the cord. Question early abnormal T2 signal in the cord. The possibility of tumor is considered but felt less likely.  Critical Value/emergent results were called by telephone at the time of interpretation on 09/06/2015 at 3:22 pm to [facility] , who verbally acknowledged these results.   Electronically Signed   By: Paulina Fusi M.D.   On: 09/06/2015 15:27    EKG: Independently reviewed. NSR, chronic RBBB.  Assessment/Plan Present on Admission:  . Osteomyelitis . Paraspinal abscess  T3-t4 paraspinal abscess: appreciate ID and neurosurgery input, awaiting interventional radiology recommendation for aspiration, keep npo, prn pain meds. EDP talked to IR. Blood culture drawn in the ER, no fever, vital stable. abx per ID after paraspinal abscess aspiration or clinical deterioration.  H/o crohn's disease, currently denies ab pain ,continue home meds.  H/o schizophrenia, currently stable, denies hallucination, no frank psychosis, continue home meds.  H/O IV drug use, reported quit 2months ago. Noticed recently finished detox with clonidine protocol at [facility] long.  H/o smoking, reported quit a few weeks ago.  DVT prophylaxis: scd  Consultants:   IR ID neurosurgery  Code Status: full   Family Communication:  Patient   Disposition Plan: admit to med surg  Time spent:  Mayleen Borrero MD, PhD [facility] Pager [phone] If 7PM-7AM, please contact night-coverage at [email or web address], password [email or web address]\

## 2015-09-06 NOTE — Care Management Note (Addendum)
Case Management Note  Patient Details  Name: Nicole Fitzgerald MRN: [id number] Date of Birth: 03/18/1961  Subjective/Objective:   Patient presents to ED with increased weakness and chest pain               Action/Plan: Discussed home health services with patient   Expected Discharge Date:    09/06/2015              Expected Discharge Plan:  Home w Home Health Services  In-House Referral:     Discharge planning Services  CM Consult  Post Acute Care Choice:  Home Health Choice offered to:  Patient  DME Arranged:   none needed per patient DME Agency:     HH Arranged:  RN, PT HH Agency:  [facility]  Status of Service:   completed signed off  Medicare Important Message Given:    Date Medicare IM Given:    Medicare IM give by:    Date Additional Medicare IM Given:    Additional Medicare Important Message give by:     If discussed at Long Length of Stay Meetings, dates discussed:    Additional Comments:  See shift note.  Discussed with EDP who has placed orders for home health RN and PT.  EDCM also provided patient with list of pcps who accept patient's insurance within a five mile radius of zip code in [place] [address].  Patient is now living with her son and does not know her new address.  Patient has provided phone number [phone] for call back.  [facility] faxed home health orders to [facility] with confirmation of receipt.  Bennie Dallas, Lycia Sachdeva, RN 09/06/2015, 12:43 AM

## 2015-09-06 NOTE — ED Notes (Signed)
Pt ambulated to restroom with steady gait and stand by assist. Pt did not need assistance with toileting.

## 2015-09-06 NOTE — Progress Notes (Signed)
EDCM wanted to call patient for follow up in regards to finding a pcp.  [facility] noted patient now at [facility] ED.  EDCM informed [facility] EDCM.

## 2015-09-06 NOTE — ED Notes (Signed)
Pt transporting to MRI at this time. NAD.

## 2015-09-06 NOTE — Consult Note (Signed)
[facility] for Infectious Disease  Date of Admission:  09/06/2015  Date of Consult:  09/06/2015  Reason for Consult: T3-4 paraspinal abscess Referring Physician: Annette Stable, ED MD  Impression/Recommendation T3-4 Paraspinal abscess Crohn's Disease  Hold anbx unless clinical change Check BCx IR to eval and aspirate After aspirate start vanco/ceftriaxone (or if clinical change) Appreciate neurosurgery eval  Comment- Possible she seeded her spine with recent hospitalization. Strep would characteristically do this.   Thank you so much for this interesting consult,   Bobby Rumpf (pager) [phone] [email or web address]  Nicole Fitzgerald is an 54 y.o. female.  HPI: 54 yo F with hx of Crohn's disease who was adm to [facility] 08-2015 for pneumonia (BCx-). She was then sent to SNF for 2 weeks, home for 1 week. She states that she has been having low back pain.  She states that over the weekend, her pain became so severe that she was unable to sit up. She spent the weekend laying flat.  She denies fever or chills or recent wounds.    Past Medical History  Diagnosis Date  . Crohn's disease   . Chronic back pain   . Prolonged QT interval 08/13/2015  . RBBB (right bundle branch block) 08/13/2015  . Schizophrenia     Past Surgical History  Procedure Laterality Date  . Cholecystectomy    . Abdominal hysterectomy    . Bowel resection       Allergies  Allergen Reactions  . Aspirin Other (See Comments)    MD told not to take due to crohns    Medications: Scheduled:  Abtx:  Anti-infectives    None      Total days of antibiotics: 0          Social History:  reports that she quit smoking about 3 weeks ago. Her smoking use included Cigarettes. She does not have any smokeless tobacco history on file. She reports that she does not drink alcohol or use illicit drugs.  Family History  Problem Relation Age of Onset  . Hypertension Mother   father died of train injury  General ROS: no  f/c. no change in vision. no LE numbness or tingling in LE. no change in BM, no dysuria, chest pain with deep inspiration. no cough or SOB. no hx of TB exposure. chronic UE numbness in hands. o/w 12 point ROS (-). see HPI.    Blood pressure 127/83, pulse 86, temperature 98.7 F (37.1 C), temperature source Oral, resp. rate 9, height _0  (1.676 m), weight 21.773 kg (48 lb), SpO2 99 %. General appearance: alert, cooperative and mild distress Eyes: negative findings: conjunctivae and sclerae normal and pupils equal, round, reactive to light and accomodation Throat: normal findings: oropharynx pink & moist without lesions or evidence of thrush Neck: no adenopathy and supple, symmetrical, trachea midline Back: no pain in lower spine with palpation. no increase in heat. no erythema.  Lungs: clear to auscultation bilaterally Heart: regular rate and rhythm Abdomen: normal findings: bowel sounds normal and soft, non-tender Extremities: edema none Skin: sunburn.   Grossly normal light touch BLE>     Results for orders placed or performed during the hospital encounter of 09/06/15 (from the past 48 hour(s))  CBC     Status: Abnormal   Collection Time: 09/06/15 12:24 PM  Result Value Ref Range   WBC 9.4 4.0 - 10.5 K/uL   RBC 4.20 3.87 - 5.11 MIL/uL   Hemoglobin 11.8 (L) 12.0 - 15.0 g/dL   HCT 37.1  36.0 - 46.0 %   MCV 88.3 78.0 - 100.0 fL   MCH 28.1 26.0 - 34.0 pg   MCHC 31.8 30.0 - 36.0 g/dL   RDW 15.7 (H) 11.5 - 15.5 %   Platelets 337 150 - 400 K/uL  Basic metabolic panel     Status: Abnormal   Collection Time: 09/06/15 12:24 PM  Result Value Ref Range   Sodium 136 135 - 145 mmol/L   Potassium 3.4 (L) 3.5 - 5.1 mmol/L   Chloride 97 (L) 101 - 111 mmol/L   CO2 33 (H) 22 - 32 mmol/L   Glucose, Bld 81 65 - 99 mg/dL   BUN <5 (L) 6 - 20 mg/dL   Creatinine, Ser 0.66 0.44 - 1.00 mg/dL   Calcium 8.4 (L) 8.9 - 10.3 mg/dL   GFR calc non Af Amer >60 >60 mL/min   GFR calc Af Amer >60 >60 mL/min      Comment: (NOTE) The eGFR has been calculated using the CKD EPI equation. This calculation has not been validated in all clinical situations. eGFR's persistently <60 mL/min signify possible Chronic Kidney Disease.    Anion gap 6 5 - 15  Blood culture (routine x 2)     Status: None (Preliminary result)   Collection Time: 09/06/15  4:39 PM  Result Value Ref Range   Specimen Description BLOOD LEFT ANTECUBITAL    Special Requests BOTTLES DRAWN AEROBIC AND ANAEROBIC 5CC    Culture PENDING    Report Status PENDING       Component Value Date/Time   SDES BLOOD LEFT ANTECUBITAL 09/06/2015 1639   SPECREQUEST BOTTLES DRAWN AEROBIC AND ANAEROBIC 5CC 09/06/2015 1639   CULT PENDING 09/06/2015 1639   REPTSTATUS PENDING 09/06/2015 1639   Dg Chest 2 View  09/05/2015   CLINICAL DATA:  54 year old female with recurrent pneumonia like symptoms  EXAM: CHEST  2 VIEW  COMPARISON:  Prior chest x-ray 08/12/2015  FINDINGS: Developing linear opacities in the right lower lobe most consistent with areas of atelectasis or scarring. No focal airspace consolidation. Cardiac and mediastinal contours remain within normal limits. No pleural effusion or pneumothorax. No suspicious nodule or mass. No acute osseous abnormality.  IMPRESSION: Linear atelectasis versus scarring in the right lower lobe.   Electronically Signed   By: Jacqulynn Cadet M.D.   On: 09/05/2015 14:51   Ct Angio Chest Pe W/cm &/or Wo Cm  09/05/2015   CLINICAL DATA:  54 year old female with pain across the upper chest and shortness of breath  EXAM: CT ANGIOGRAPHY CHEST WITH CONTRAST  TECHNIQUE: Multidetector CT imaging of the chest was performed using the standard protocol during bolus administration of intravenous contrast. Multiplanar CT image reconstructions and MIPs were obtained to evaluate the vascular anatomy.  CONTRAST:  166m OMNIPAQUE IOHEXOL 350 MG/ML SOLN  COMPARISON:  Chest radiograph dated 09/05/2015  FINDINGS: Evaluation of this exam is  limited due to respiratory motion artifact.  There is linea atelectasis/scarring in the lingula. There is partial consolidation of the right middle lobe possibly atelectasis versus pneumonia. Right lower lobe atelectatic changes noted. There is no pleural effusion or pneumothorax. The central airways are patent.  The visualized thoracic aorta appears unremarkable. No CT evidence of pulmonary embolism. There is no cardiomegaly or pericardial effusion. There is no hilar adenopathy. Top-normal upper mediastinal lymph nodes noted. The thyroid gland appears unremarkable.  There is a bilobed appearing paraspinal soft tissue mass at T3-T4 most compatible with a neoplasm. The right lobe of the mass measures  approximately 3.3 x 2.0 cm and the left lobe of the lesion measures approximately 2.9 x 1.2 cm. There is associated destructive changes and fracture of the inferior endplate of the T3 and superior endplate of T4 compatible with pathologic fracture. Differential includes a neurogenic tumor, non neurogenic tumors such as chondroid or osseous neoplasm, lymphoma. Other infectious process is not excluded. Clinical correlation is recommended. MRI without and with contrast is recommended for further characterization.  The visualized upper abdomen is unremarkable.  Review of the MIP images confirms the above findings.  IMPRESSION: No CT evidence of pulmonary embolism.  Bilobed paraspinal soft tissue mass centered at T3-T4 with mild destructive changes and pathologic fracture of the adjacent vertebra most compatible with neoplastic process. MRI is recommended for further characterization.   Electronically Signed   By: Anner Crete M.D.   On: 09/05/2015 23:44   Mr Thoracic Spine W Wo Contrast  09/06/2015   CLINICAL DATA:  Two week history of cough and shortness of breath. Stabbing pain in the thoracic spinal region with inspiration and cough. Abnormal CT at T3-4  EXAM: MRI THORACIC SPINE WITHOUT AND WITH CONTRAST  TECHNIQUE:  Multiplanar and multiecho pulse sequences of the thoracic spine were obtained without and with intravenous contrast.  CONTRAST:  25m MULTIHANCE GADOBENATE DIMEGLUMINE 529 MG/ML IV SOLN  COMPARISON:  CT chest 09/05/2015  FINDINGS: Scout views in the cervical regions show degenerative spondylosis most pronounced at T5-6 and T6-7.  In the thoracic region, there is no abnormal process centered at the T3-4 disc space with involvement of the T3 and T4 vertebral bodies and paravertebral soft tissues. The findings are most consistent with discitis osteomyelitis at this level. There is thickening and enhancement of the epidural tissues, resulting in constriction of the thecal sac and slight displacement and deformity of the cord. No definite abnormal cord signal. One could question early abnormal T2 signal. Paravertebral material is largely enhancing, though there is some nonenhancing fluid on the right consistent with abscess.  No involvement elsewhere in the thoracic region is identified. There is degenerative disc disease at T11-12 and T12-L1.  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 Paravertebral phlegmonous inflammation with early abscess formation on the right. Epidural phlegmonous inflammation with constriction of the thecal sac and slight deformity and displacement of the cord. Question early abnormal T2 signal in the cord. The possibility of tumor is considered but felt less likely.  Critical Value/emergent results were called by telephone at the time of interpretation on 09/06/2015 at 3:22 pm [facility] who verbally acknowledged these results.   Electronically Signed   By: MNelson ChimesM.D.   On: 09/06/2015 15:27   Recent Results (from the past 240 hour(s))  Blood culture (routine x 2)     Status: None (Preliminary result)   Collection Time: 09/06/15  4:39 PM  Result Value Ref Range Status   Specimen  Description BLOOD LEFT ANTECUBITAL  Final   Special Requests BOTTLES DRAWN AEROBIC AND ANAEROBIC 5CC  Final   Culture PENDING  Incomplete   Report Status PENDING  Incomplete      09/06/2015, 5:25 PM     LOS: 0 days    Records and images were personally reviewed where available.

## 2015-09-06 NOTE — ED Notes (Signed)
Patient states was told yesterday that she has spinal cancer and that she needed an MRI.  Patient states that MD referred her to a list of providers to follow up with.   Patient states she is "scared to death now and having pain and I can't wait for 3 weeks to see another doctor.  My son told me yall were better here and to come here for a second opinion".

## 2015-09-06 NOTE — Care Management (Addendum)
ED CM spoke with patient regarding establishing care with a PCP, discussed the benefits and importance of primary care, patient agreeable. Patient states, she no longer lives in [place], now lives in [place] updated information in patient's record. Discussed the [facility] or [facility] [address] [phone] for follow-up care, patient is amendable to establishing care with the clinic. CM will follow up with patient for discharge plan.

## 2015-09-06 NOTE — Progress Notes (Signed)
Received report from Melissa in ED.

## 2015-09-06 NOTE — ED Notes (Signed)
Attempted report 

## 2015-09-07 ENCOUNTER — Inpatient Hospital Stay (HOSPITAL_COMMUNITY): Payer: Medicare Other

## 2015-09-07 DIAGNOSIS — G061 Intraspinal abscess and granuloma: Secondary | ICD-10-CM | POA: Diagnosis not present

## 2015-09-07 DIAGNOSIS — M8618 Other acute osteomyelitis, other site: Secondary | ICD-10-CM

## 2015-09-07 DIAGNOSIS — F209 Schizophrenia, unspecified: Secondary | ICD-10-CM

## 2015-09-07 HISTORY — PX: CT GUIDE ABSCESS DRAINAGE  (ARMC HX): HXRAD1395

## 2015-09-07 LAB — APTT: APTT: 79 s — AB (ref 24–37)

## 2015-09-07 LAB — CBC
HEMATOCRIT: 35.6 % — AB (ref 36.0–46.0)
Hemoglobin: 11.5 g/dL — ABNORMAL LOW (ref 12.0–15.0)
MCH: 28.2 pg (ref 26.0–34.0)
MCHC: 32.3 g/dL (ref 30.0–36.0)
MCV: 87.3 fL (ref 78.0–100.0)
PLATELETS: 284 10*3/uL (ref 150–400)
RBC: 4.08 MIL/uL (ref 3.87–5.11)
RDW: 15.6 % — AB (ref 11.5–15.5)
WBC: 7.1 10*3/uL (ref 4.0–10.5)

## 2015-09-07 LAB — PROTIME-INR
INR: 1.15 (ref 0.00–1.49)
Prothrombin Time: 14.9 seconds (ref 11.6–15.2)

## 2015-09-07 LAB — COMPREHENSIVE METABOLIC PANEL
ALBUMIN: 2.3 g/dL — AB (ref 3.5–5.0)
ALK PHOS: 80 U/L (ref 38–126)
ALT: 15 U/L (ref 14–54)
AST: 15 U/L (ref 15–41)
Anion gap: 9 (ref 5–15)
BILIRUBIN TOTAL: 0.7 mg/dL (ref 0.3–1.2)
CALCIUM: 8.1 mg/dL — AB (ref 8.9–10.3)
CO2: 28 mmol/L (ref 22–32)
CREATININE: 0.55 mg/dL (ref 0.44–1.00)
Chloride: 97 mmol/L — ABNORMAL LOW (ref 101–111)
GFR calc Af Amer: >60 mL/min (ref >60)
GLUCOSE: 64 mg/dL — AB (ref 65–99)
POTASSIUM: 3.5 mmol/L (ref 3.5–5.1)
Sodium: 134 mmol/L — ABNORMAL LOW (ref 135–145)
TOTAL PROTEIN: 6.5 g/dL (ref 6.5–8.1)

## 2015-09-07 LAB — HIV ANTIBODY (ROUTINE TESTING W REFLEX): HIV SCREEN 4TH GENERATION: NONREACTIVE

## 2015-09-07 MED ORDER — VANCOMYCIN HCL IN DEXTROSE 750-5 MG/150ML-% IV SOLN
750.0000 mg | Freq: Two times a day (BID) | INTRAVENOUS | Status: DC
  Administered 2015-09-08 – 2015-09-11 (×7): 750 mg via INTRAVENOUS
  Filled 2015-09-07 (×9): qty 150

## 2015-09-07 MED ORDER — DEXTROSE 5 % IV SOLN
2.0000 g | INTRAVENOUS | Status: DC
  Administered 2015-09-07: 2 g via INTRAVENOUS
  Filled 2015-09-07 (×2): qty 2

## 2015-09-07 MED ORDER — KCL IN DEXTROSE-NACL 20-5-0.9 MEQ/L-%-% IV SOLN
INTRAVENOUS | Status: DC
Start: 2015-09-07 — End: 2015-09-11
  Administered 2015-09-07: 75 mL/h via INTRAVENOUS
  Administered 2015-09-09 – 2015-09-11 (×3): via INTRAVENOUS
  Filled 2015-09-07 (×11): qty 1000

## 2015-09-07 MED ORDER — NITROGLYCERIN 0.4 MG SL SUBL: 0.4000 mg | SUBLINGUAL_TABLET | SUBLINGUAL | Status: DC | PRN

## 2015-09-07 MED ORDER — LIDOCAINE HCL 1 % IJ SOLN
INTRAMUSCULAR | Status: AC
  Filled 2015-09-07: qty 20

## 2015-09-07 MED ORDER — MIDAZOLAM HCL 2 MG/2ML IJ SOLN
INTRAMUSCULAR | Status: AC | PRN
  Administered 2015-09-07: 2 mg via INTRAVENOUS

## 2015-09-07 MED ORDER — MORPHINE SULFATE (PF) 2 MG/ML IV SOLN
2.0000 mg | INTRAVENOUS | Status: DC | PRN
Start: 2015-09-07 — End: 2015-09-09
  Administered 2015-09-07 – 2015-09-09 (×10): 2 mg via INTRAVENOUS
  Filled 2015-09-07 (×11): qty 1

## 2015-09-07 MED ORDER — OXYCODONE-ACETAMINOPHEN 5-325 MG PO TABS
1.0000 | ORAL_TABLET | ORAL | Status: DC | PRN
  Administered 2015-09-07 – 2015-09-10 (×11): 1 via ORAL
  Filled 2015-09-07 (×14): qty 1

## 2015-09-07 MED ORDER — FENTANYL CITRATE (PF) 100 MCG/2ML IJ SOLN
INTRAMUSCULAR | Status: AC
  Filled 2015-09-07: qty 2

## 2015-09-07 MED ORDER — FENTANYL CITRATE (PF) 100 MCG/2ML IJ SOLN
INTRAMUSCULAR | Status: AC | PRN
  Administered 2015-09-07 (×2): 50 ug via INTRAVENOUS

## 2015-09-07 MED ORDER — VANCOMYCIN HCL 10 G IV SOLR
1500.0000 mg | Freq: Once | INTRAVENOUS | Status: AC
  Administered 2015-09-07: 1500 mg via INTRAVENOUS
  Filled 2015-09-07: qty 1500

## 2015-09-07 MED ORDER — MIDAZOLAM HCL 2 MG/2ML IJ SOLN
INTRAMUSCULAR | Status: AC
  Filled 2015-09-07: qty 2

## 2015-09-07 NOTE — Procedures (Signed)
Interventional Radiology Procedure Note  Procedure:  CT guided paraspinal aspiration  Complications:  None  Estimated Blood Loss:  <10 mL  18 G trocar needle advanced from right paraspinous approach and angled to right paraspinous soft tissues at T3-4.  Immediate return of purulent fluid.  Able to aspirate 2 mL of pus.  Sent for culture studies.  Jodi Marble. Fredia Sorrow, M.D Pager:  [phone]

## 2015-09-07 NOTE — Sedation Documentation (Signed)
Patient denies pain and is resting comfortably.  

## 2015-09-07 NOTE — Progress Notes (Addendum)
Patient complains of chest pain 10/10 associated with difficulty breathing, pain radiating to shoulders and back. Patient keeps asking for pain medications; however, patient is very drowsy, lethargic, with some confusion. Pain medication not given at the moment. Floor coverage Kirtland Bouchard Schorr NP paged and notified.  Kendall Flack 09/07/2015 8:57 PM  K. Clearence Ped NP called back, ordered EKG 12 leads and Nitroglycerin  Kendall Flack 09/07/2015 9:33 PM  Patient refused EKG per NT, refused Nitroglycerin, and refused all of her PO scheduled medications. Merdis Delay NP aware. Patient now stated that she did not have chest pain, "My breast area hurt because I laid on the table for so long today for the procedure." Patient is more alert now, oriented x 4. Handoff report to [facility], informed about incident and will follow up with patient care.    Kendall Flack 09/08/2015 12:30 AM

## 2015-09-07 NOTE — Hospital Discharge Follow-Up (Signed)
Contacted by Letha Cape, CM informing this CM that the patient may need to have a hospital follow up appointment scheduled at the [facility]. However, her discharge disposition has not been determined.    Irish Lack, CM to contact [facility] when it is determined that the patient will need a follow up visit scheduled.

## 2015-09-07 NOTE — Progress Notes (Signed)
PROGRESS NOTE  Nicole Fitzgerald NGE:[id number] DOB: Sep 19, 1961 DOA: 09/06/2015 PCP: No primary care provider on file.  HPI/Recap of past 24 hours:  S/p CT guided  Aspiration of thoracic paraspinal abscess.    Assessment/Plan: Active Problems:   Osteomyelitis   Paraspinal abscess  T3-t4 paraspinal abscess:  S/p CT guided aspiration of 9/7, start vanc/rocephin, follow up on cultures ( blood and aspirate), prn pain meds  no fever, vital stable so far, no significant neurologic impairment appreciate ID/ neurosurgery/IR input,  H/o crohn's disease, currently denies ab pain ,continue home meds.  H/o schizophrenia, currently stable, denies hallucination, no frank psychosis, continue home meds.  H/O IV drug use, reported quit 2months ago. Noticed recently finished detox with clonidine protocol at [facility] long.  H/o smoking, reported quit a few weeks ago.  DVT prophylaxis: scd  Consultants:  IR ID neurosurgery  Code Status: full   Family Communication: Patient    Disposition Plan: remain in the hospital    Procedures:  CT guided aspiration of t3-t4 paraspinal abscess 9/7  Antibiotics:  Vanc/rocephin after spine aspiration of 9/7   Objective: BP 89/62 mmHg  Pulse 91  Temp(Src) 98 F (36.7 C) (Oral)  Resp 11  Ht  (1.676 m)  Wt 148 lb (67.132 kg)  BMI 23.90 kg/m2  SpO2 100%  Intake/Output Summary (Last 24 hours) at 09/07/15 1431 Last data filed at 09/07/15 1236  Gross per 24 hour  Intake      0 ml  Output    550 ml  Net   -550 ml   Filed Weights   09/06/15 1024 09/06/15 1850  Weight: 48 lb (21.773 kg) 148 lb (67.132 kg)    Exam:   General:  NAD  Cardiovascular: RRR  Respiratory: CTABL  Abdomen: Soft/ND/NT, positive BS  Musculoskeletal: No Edema  Neuro: aaox3, calm and cooperative  Data Reviewed: Basic Metabolic Panel:  Recent Labs Lab 09/05/15 2024 09/06/15 1224 09/07/15 0539  NA 137 136 134*  K 3.5 3.4* 3.5  CL 101 97* 97*    CO2 28 33* 28  GLUCOSE 67 81 64*  BUN 6 <5* <5*  CREATININE 0.64 0.66 0.55  CALCIUM 8.4* 8.4* 8.1*   Liver Function Tests:  Recent Labs Lab 09/07/15 0539  AST 15  ALT 15  ALKPHOS 80  BILITOT 0.7  PROT 6.5  ALBUMIN 2.3*   No results for input(s): LIPASE, AMYLASE in the last 168 hours. No results for input(s): AMMONIA in the last 168 hours. CBC:  Recent Labs Lab 09/05/15 2024 09/06/15 1224 09/07/15 0539  WBC 9.0 9.4 7.1  HGB 11.9* 11.8* 11.5*  HCT 36.5 37.1 35.6*  MCV 89.0 88.3 87.3  PLT 271 337 284   Cardiac Enzymes:   No results for input(s): CKTOTAL, CKMB, CKMBINDEX, TROPONINI in the last 168 hours. BNP (last 3 results) No results for input(s): BNP in the last 8760 hours.  ProBNP (last 3 results) No results for input(s): PROBNP in the last 8760 hours.  CBG: No results for input(s): GLUCAP in the last 168 hours.  Recent Results (from the past 240 hour(s))  Blood culture (routine x 2)     Status: None (Preliminary result)   Collection Time: 09/06/15  4:39 PM  Result Value Ref Range Status   Specimen Description BLOOD LEFT ANTECUBITAL  Final   Special Requests BOTTLES DRAWN AEROBIC AND ANAEROBIC 5CC  Final   Culture  Setup Time   Final    GRAM POSITIVE COCCI IN CLUSTERS IN BOTH  AEROBIC AND ANAEROBIC BOTTLES CRITICAL RESULT CALLED TO, READ BACK BY AND VERIFIED WITH: N ROBERTSON,RN AT 1215 09/07/15 BY L BENFIELD    Culture GRAM POSITIVE COCCI  Final   Report Status PENDING  Incomplete  Blood culture (routine x 2)     Status: None (Preliminary result)   Collection Time: 09/06/15  8:30 PM  Result Value Ref Range Status   Specimen Description BLOOD RIGHT HAND  Final   Special Requests IN PEDIATRIC BOTTLE 1CC  Final   Culture NO GROWTH < 24 HOURS  Final   Report Status PENDING  Incomplete     Studies: Mr Thoracic Spine W Wo Contrast  09/06/2015   CLINICAL DATA:  Two week history of cough and shortness of breath. Stabbing pain in the thoracic spinal region  with inspiration and cough. Abnormal CT at T3-4  EXAM: MRI THORACIC SPINE WITHOUT AND WITH CONTRAST  TECHNIQUE: Multiplanar and multiecho pulse sequences of the thoracic spine were obtained without and with intravenous contrast.  CONTRAST:  33mL MULTIHANCE GADOBENATE DIMEGLUMINE 529 MG/ML IV SOLN  COMPARISON:  CT chest 09/05/2015  FINDINGS: Scout views in the cervical regions show degenerative spondylosis most pronounced at T5-6 and T6-7.  In the thoracic region, there is no abnormal process centered at the T3-4 disc space with involvement of the T3 and T4 vertebral bodies and paravertebral soft tissues. The findings are most consistent with discitis osteomyelitis at this level. There is thickening and enhancement of the epidural tissues, resulting in constriction of the thecal sac and slight displacement and deformity of the cord. No definite abnormal cord signal. One could question early abnormal T2 signal. Paravertebral material is largely enhancing, though there is some nonenhancing fluid on the right consistent with abscess.  No involvement elsewhere in the thoracic region is identified. There is degenerative disc disease at T11-12 and T12-L1.  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 Paravertebral phlegmonous inflammation with early abscess formation on the right. Epidural phlegmonous inflammation with constriction of the thecal sac and slight deformity and displacement of the cord. Question early abnormal T2 signal in the cord. The possibility of tumor is considered but felt less likely.  Critical Value/emergent results were called by telephone at the time of interpretation on 09/06/2015 at 3:22 pm to [facility] , who verbally acknowledged these results.   Electronically Signed   By: Paulina Fusi M.D.   On: 09/06/2015 15:27    Scheduled Meds: . ALPRAZolam  1 mg Oral QID  . cefTRIAXone (ROCEPHIN)  IV  2 g  Intravenous Q24H  . fentaNYL      . gabapentin  100 mg Oral Q12H  . Influenza vac split quadrivalent PF  0.5 mL Intramuscular Tomorrow-1000  . lidocaine      . loratadine  10 mg Oral Daily  . mesalamine  1.2 g Oral BID  . midazolam      . OLANZapine  10 mg Oral QHS  . traZODone  100 mg Oral BID  . vancomycin  1,500 mg Intravenous Once  . [START ON 09/08/2015] vancomycin  750 mg Intravenous Q12H    Continuous Infusions:    Time spent:  Elridge Stemm MD, PhD  [facility] Pager [phone]. If 7PM-7AM, please contact night-coverage at [email or web address], password [facility] 09/07/2015, 2:31 PM  LOS: 1 day

## 2015-09-07 NOTE — Progress Notes (Signed)
Clarified with Dr Jonelle Sports that patient can take percocet with sip of water.

## 2015-09-07 NOTE — Care Management Note (Signed)
Case Management Note  Patient Details  Name: Alizey Macks MRN: [id number] Date of Birth: January 05, 1961  Subjective/Objective:    NCM spoke with patient, she was very drowsy, talking with eyes closed.  She states she lives with her son.  Patient has cord compression /para spinal abscess. Has mobility issues.  She has insurance.  NCM made note of previous NCM note about [facility] clinic but patient may need to go to snf for long term iv abx, due to psa.(iv).  Patient is active with [facility] for [place] and HHPT.  Patient states she has an aide Mon- Sun from 2 pm to 5 pm.  Await pt eval.  NCM will cont to follow for dc needs.                  Action/Plan:   Expected Discharge Date:                  Expected Discharge Plan:  Skilled Nursing Facility  In-House Referral:  Clinical Social Work  Discharge planning Services  CM Consult  Post Acute Care Choice:    Choice offered to:     DME Arranged:    DME Agency:     HH Arranged:    HH Agency:     Status of Service:  In process, will continue to follow  Medicare Important Message Given:    Date Medicare IM Given:    Medicare IM give by:    Date Additional Medicare IM Given:    Additional Medicare Important Message give by:     If discussed at Long Length of Stay Meetings, dates discussed:    Additional Comments:  Leone Haven, RN 09/07/2015, 12:04 PM

## 2015-09-07 NOTE — Consult Note (Signed)
Chief Complaint: Patient was seen in consultation today for severe back pain; T3-4 diskitis/paraspinal abscess Chief Complaint  Patient presents with  . Back Pain   at the request of Dr Ninetta Lights  Referring Physician(s): [facility] Dr Ninetta Lights  History of Present Illness: Nicole Fitzgerald is a 54 y.o. 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 Paravertebral phlegmonous inflammation with early abscess formation on the right. Epidural phlegmonous inflammation with constriction of the thecal sac and slight deformity and displacement of the cord. Question early abnormal T2 signal in the cord. The possibility of tumor is considered but felt less likely.  Now scheduled for Thoracic 3-4 disc aspiration/paraspinal abscess aspiration Dr Fredia Sorrow and Dr Deanne Coffer have reviewed imaging and approve procedure Per Dr Ninetta Lights request Dr Jordan Likes has also seen pt and agrees with IR aspiration   Past Medical History  Diagnosis Date  . Crohn's disease   . Chronic back pain   . Prolonged QT interval 08/13/2015  . RBBB (right bundle branch block) 08/13/2015  . Schizophrenia     Past Surgical History  Procedure Laterality Date  . Cholecystectomy    . Abdominal hysterectomy    . Bowel resection      Allergies: Aspirin  Medications: Prior to Admission medications   Medication Sig Start Date End Date Taking? Authorizing Provider  AMITIZA 24 MCG capsule Take 24 mcg by mouth 2 (two) times daily as needed for constipation.  07/13/15  Yes Historical Provider, MD  dicyclomine (BENTYL) 20 MG tablet Take 1 tablet (20 mg total) by mouth every 6 (six) hours as needed for spasms (abdominal cramping). 08/15/15  Yes Christina P Rama, MD    gabapentin (NEURONTIN) 100 MG capsule Take 100 mg by mouth every 12 (twelve) hours. 08/29/15  Yes Historical Provider, MD  hydrOXYzine (ATARAX/VISTARIL) 25 MG tablet Take 1 tablet (25 mg total) by mouth every 6 (six) hours as needed for anxiety. 08/15/15  Yes Christina P Rama, MD  levocetirizine (XYZAL) 5 MG tablet Take 5 mg by mouth daily.  07/13/15  Yes Historical Provider, MD  LIALDA 1.2 G EC tablet Take 1.2 g by mouth 2 (two) times daily.  07/14/15  Yes Historical Provider, MD  LIDODERM 5 % Place 1 patch onto the skin 2 (two) times daily. 07/13/15  Yes Historical Provider, MD  methylphenidate (RITALIN) 20 MG tablet Take 20 mg by mouth 3 (three) times daily. 08/19/15  Yes Historical Provider, MD  NEXIUM 40 MG capsule Take 40 mg by mouth 2 (two) times daily.  07/13/15  Yes Historical Provider, MD  OLANZapine (ZYPREXA) 10 MG tablet Take 10 mg by mouth at bedtime.  07/13/15  Yes Historical Provider, MD  OLANZapine (ZYPREXA) 20 MG tablet Take 20 mg by mouth daily.  07/13/15  Yes Historical Provider, MD  oxyCODONE-acetaminophen (PERCOCET/ROXICET) 5-325 MG per tablet Take 1-2 tablets by mouth every 8 (eight) hours as needed (back pain). 09/06/15  Yes Marily Memos, MD  potassium chloride SA (K-DUR,KLOR-CON) 20 MEQ tablet Take 20 mEq by mouth daily.  07/13/15  Yes Historical Provider, MD  predniSONE (DELTASONE) 20 MG tablet Take 20 mg by mouth daily.  07/01/15  Yes Historical Provider, MD  PREMPRO 0.625-2.5 MG per tablet Take 1  tablet by mouth daily. 08/09/15  Yes Historical Provider, MD  PROAIR HFA 108 (90 BASE) MCG/ACT inhaler Inhale 1 puff into the lungs every 4 (four) hours as needed for wheezing or shortness of breath.  07/13/15  Yes Historical Provider, MD  traMADol (ULTRAM) 50 MG tablet Take 0.5 tablets (25 mg total) by mouth every 6 (six) hours as needed for severe pain. 08/15/15  Yes Maryruth Bun Rama, MD  traZODone (DESYREL) 100 MG tablet Take 100 mg by mouth 2 (two) times daily.  07/13/15  Yes Historical  Provider, MD  Prudy Feeler 1 MG tablet Take 1 tablet (1 mg total) by mouth 4 (four) times daily. 08/15/15  Yes Maryruth Bun Rama, MD     Family History  Problem Relation Age of Onset  . Hypertension Mother   .       Social History   Social History  . Marital Status: Legally Separated    Spouse Name: N/A  . Number of Children: N/A  . Years of Education: N/A   Social History Main Topics  . Smoking status: Former Smoker    Types: Cigarettes    Quit date: 08/16/2015  . Smokeless tobacco: None  . Alcohol Use: No  . Drug Use: No  . Sexual Activity: Not Asked   Other Topics Concern  . None   Social History Narrative    Review of Systems: A 12 point ROS discussed and pertinent positives are indicated in the HPI above.  All other systems are negative.  Review of Systems  Constitutional: Positive for fever, activity change and fatigue.  Respiratory: Negative for shortness of breath.   Gastrointestinal: Negative for abdominal pain.  Genitourinary: Negative for difficulty urinating.  Musculoskeletal: Positive for back pain and gait problem.  Neurological: Positive for weakness.  Psychiatric/Behavioral: Negative for behavioral problems and confusion.    Vital Signs: BP 126/73 mmHg  Pulse 83  Temp(Src) 99.9 F (37.7 C) (Oral)  Resp 15  Ht 5\' 6"  (1.676 m)  Wt 148 lb (67.132 kg)  BMI 23.90 kg/m2  SpO2 97%  Physical Exam  Constitutional: She is oriented to person, place, and time.  Cardiovascular: Normal rate, regular rhythm and normal heart sounds.   Pulmonary/Chest: Effort normal and breath sounds normal.  Abdominal: Soft. Bowel sounds are normal.  Musculoskeletal: Normal range of motion.  Mid back pain  Neurological: She is alert and oriented to person, place, and time.  Skin: Skin is warm and dry.  Psychiatric: She has a normal mood and affect. Her behavior is normal. Judgment and thought content normal.  Nursing note and vitals reviewed.   Mallampati Score:  MD  Evaluation Airway: WNL Heart: WNL Abdomen: WNL Chest/ Lungs: WNL ASA  Classification: 3 Mallampati/Airway Score: One  Imaging: Dg Chest 2 View  09/05/2015   CLINICAL DATA:  54 year old female with recurrent pneumonia like symptoms  EXAM: CHEST  2 VIEW  COMPARISON:  Prior chest x-ray 08/12/2015  FINDINGS: Developing linear opacities in the right lower lobe most consistent with areas of atelectasis or scarring. No focal airspace consolidation. Cardiac and mediastinal contours remain within normal limits. No pleural effusion or pneumothorax. No suspicious nodule or mass. No acute osseous abnormality.  IMPRESSION: Linear atelectasis versus scarring in the right lower lobe.   Electronically Signed   By: Malachy Moan M.D.   On: 09/05/2015 14:51   X-ray Chest Pa And Lateral  08/12/2015   CLINICAL DATA:  Persistent cough, congestion, shortness breath, and chest pain  EXAM: CHEST  2  VIEW  COMPARISON:  Chest x-ray of August 11, 2015  FINDINGS: The lungs are better inflated today. Persistent lingular infiltrate is noted. Slightly increased density in the right infrahilar region is more conspicuous today. The heart and pulmonary vascularity are normal. The mediastinum is normal in width. There is no pleural effusion. The bony thorax is unremarkable.  IMPRESSION: Persistent lingular pneumonia. There may be early atelectasis or pneumonia in the right middle lobe medially. There is no pleural effusion or pneumothorax. Continued follow-up chest x-rays are recommended unless the patient's symptoms markedly. If such improvement is seen clinically, a 1 month follow-up chest x-ray would be useful to assure complete clearing.   Electronically Signed   By: David  [place] M.D.   On: 08/12/2015 09:36   Dg Chest 2 View  08/11/2015   CLINICAL DATA:  Acute onset of shortness of breath. Left anterior rib pain. Initial encounter.  EXAM: CHEST  2 VIEW  COMPARISON:  Chest radiograph performed 08/10/2015  FINDINGS: The lungs are  well-aerated. Mild left lower lobe pneumonia is noted. There is no evidence of pleural effusion or pneumothorax.  The heart is normal in size; the mediastinal contour is within normal limits. No acute osseous abnormalities are seen.  IMPRESSION: Mild left lower lobe pneumonia noted.   Electronically Signed   By: Roanna Raider M.D.   On: 08/11/2015 04:06   Dg Ribs Unilateral W/chest Left  08/11/2015   CLINICAL DATA:  Larey Seat at home August 08, 2015, LEFT rib pain. Increasing shortness of breath.  EXAM: LEFT RIBS AND CHEST - 3+ VIEW  COMPARISON:  None.  FINDINGS: No fracture or other bone lesions are seen involving the ribs. There is no evidence of pneumothorax or pleural effusion. Patchy LEFT lower lobe airspace opacity, to lesser extent on the RIGHT. Heart size and mediastinal contours are within normal limits. Surgical clips in the included right abdomen compatible with cholecystectomy.  IMPRESSION: No acute rib fracture deformity.  Patchy bibasilar airspace opacities, nonspecific and can be seen with pectus excavatum though, would be better characterized and PA and lateral views of the chest when clinically able.   Electronically Signed   By: Awilda Metro M.D.   On: 08/11/2015 00:29   Ct Head Wo Contrast  08/11/2015   CLINICAL DATA:  Acute onset of visual and auditory hallucinations. Initial encounter.  EXAM: CT HEAD WITHOUT CONTRAST  TECHNIQUE: Contiguous axial images were obtained from the base of the skull through the vertex without intravenous contrast.  COMPARISON:  None.  FINDINGS: There is no evidence of acute infarction, mass lesion, or intra- or extra-axial hemorrhage on CT.  Minimal subcortical white matter change likely reflects small vessel ischemic microangiopathy.  The posterior fossa, including the cerebellum, brainstem and fourth ventricle, is within normal limits. The third and lateral ventricles, and basal ganglia are unremarkable in appearance. The cerebral hemispheres are symmetric in  appearance, with normal gray-white differentiation. No mass effect or midline shift is seen.  There is no evidence of fracture; visualized osseous structures are unremarkable in appearance. The orbits are within normal limits. The paranasal sinuses and mastoid air cells are well-aerated. No significant soft tissue abnormalities are seen.  IMPRESSION: 1. No acute intracranial pathology seen on CT. 2. Minimal small vessel ischemic microangiopathy.   Electronically Signed   By: Roanna Raider M.D.   On: 08/11/2015 01:55   Ct Angio Chest Pe W/cm &/or Wo Cm  09/05/2015   CLINICAL DATA:  54 year old female with pain across the upper chest  and shortness of breath  EXAM: CT ANGIOGRAPHY CHEST WITH CONTRAST  TECHNIQUE: Multidetector CT imaging of the chest was performed using the standard protocol during bolus administration of intravenous contrast. Multiplanar CT image reconstructions and MIPs were obtained to evaluate the vascular anatomy.  CONTRAST:  OMNIPAQUE IOHEXOL 350 MG/ML SOLN  COMPARISON:  Chest radiograph dated 09/05/2015  FINDINGS: Evaluation of this exam is limited due to respiratory motion artifact.  There is linea atelectasis/scarring in the lingula. There is partial consolidation of the right middle lobe possibly atelectasis versus pneumonia. Right lower lobe atelectatic changes noted. There is no pleural effusion or pneumothorax. The central airways are patent.  The visualized thoracic aorta appears unremarkable. No CT evidence of pulmonary embolism. There is no cardiomegaly or pericardial effusion. There is no hilar adenopathy. Top-normal upper mediastinal lymph nodes noted. The thyroid gland appears unremarkable.  There is a bilobed appearing paraspinal soft tissue mass at T3-T4 most compatible with a neoplasm. The right lobe of the mass measures approximately 3.3 x 2.0 cm and the left lobe of the lesion measures approximately 2.9 x 1.2 cm. There is associated destructive changes and fracture of the  inferior endplate of the T3 and superior endplate of T4 compatible with pathologic fracture. Differential includes a neurogenic tumor, non neurogenic tumors such as chondroid or osseous neoplasm, lymphoma. Other infectious process is not excluded. Clinical correlation is recommended. MRI without and with contrast is recommended for further characterization.  The visualized upper abdomen is unremarkable.  Review of the MIP images confirms the above findings.  IMPRESSION: No CT evidence of pulmonary embolism.  Bilobed paraspinal soft tissue mass centered at T3-T4 with mild destructive changes and pathologic fracture of the adjacent vertebra most compatible with neoplastic process. MRI is recommended for further characterization.   Electronically Signed   By: Elgie Collard M.D.   On: 09/05/2015 23:44   Mr Thoracic Spine W Wo Contrast  09/06/2015   CLINICAL DATA:  Two week history of cough and shortness of breath. Stabbing pain in the thoracic spinal region with inspiration and cough. Abnormal CT at T3-4  EXAM: MRI THORACIC SPINE WITHOUT AND WITH CONTRAST  TECHNIQUE: Multiplanar and multiecho pulse sequences of the thoracic spine were obtained without and with intravenous contrast.  CONTRAST:  13mL MULTIHANCE GADOBENATE DIMEGLUMINE 529 MG/ML IV SOLN  COMPARISON:  CT chest 09/05/2015  FINDINGS: Scout views in the cervical regions show degenerative spondylosis most pronounced at T5-6 and T6-7.  In the thoracic region, there is no abnormal process centered at the T3-4 disc space with involvement of the T3 and T4 vertebral bodies and paravertebral soft tissues. The findings are most consistent with discitis osteomyelitis at this level. There is thickening and enhancement of the epidural tissues, resulting in constriction of the thecal sac and slight displacement and deformity of the cord. No definite abnormal cord signal. One could question early abnormal T2 signal. Paravertebral material is largely enhancing, though  there is some nonenhancing fluid on the right consistent with abscess.  No involvement elsewhere in the thoracic region is identified. There is degenerative disc disease at T11-12 and T12-L1.  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 Paravertebral phlegmonous inflammation with early abscess formation on the right. Epidural phlegmonous inflammation with constriction of the thecal sac and slight deformity and displacement of the cord. Question early abnormal T2 signal in the cord. The possibility of tumor is considered but felt less  likely.  Critical Value/emergent results were called by telephone at the time of interpretation on 09/06/2015 at 3:22 pm to [facility] , who verbally acknowledged these results.   Electronically Signed   By: Paulina Fusi M.D.   On: 09/06/2015 15:27    Labs:  CBC:  Recent Labs  08/11/15 0855 09/05/15 2024 09/06/15 1224 09/07/15 0539  WBC 6.2 9.0 9.4 7.1  HGB 11.9* 11.9* 11.8* 11.5*  HCT 35.4* 36.5 37.1 35.6*  PLT 189 271 337 284    COAGS:  Recent Labs  09/07/15 0539  INR 1.15  APTT 79*    BMP:  Recent Labs  08/11/15 0056 08/11/15 0855 09/05/15 2024 09/06/15 1224 09/07/15 0539  NA 134*  --  137 136 134*  K 4.5  --  3.5 3.4* 3.5  CL 98*  --  101 97* 97*  CO2 26  --  28 33* 28  GLUCOSE 107*  --  67 81 64*  BUN 15  --  6 <5* <5*  CALCIUM 8.6*  --  8.4* 8.4* 8.1*  CREATININE 1.03* 0.82 0.64 0.66 0.55  GFRNONAA 60* >60 >60 >60 >60  GFRAA >60 >60 >60 >60 >60    LIVER FUNCTION TESTS:  Recent Labs  08/11/15 0056 09/07/15 0539  BILITOT 0.5 0.7  AST 28 15  ALT 14 15  ALKPHOS 69 80  PROT 6.7 6.5  ALBUMIN 2.7* 2.3*    TUMOR MARKERS: No results for input(s): AFPTM, CEA, CA199, CHROMGRNA in the last 8760 hours.  Assessment and Plan:  Severe back pain T3-4 discitis/paraspinal abscess Now scheduled for aspiration of same Risks and Benefits  discussed with the patient including, but not limited to bleeding, infection, damage to adjacent structures or low yield requiring additional tests. All of the patient's questions were answered, patient is agreeable to proceed. Consent signed and in chart.   Thank you for this interesting consult.  I greatly enjoyed meeting Nicole Fitzgerald and look forward to participating in their care.  A copy of this report was sent to the requesting provider on this date.  Signed: Malonie Tatum A 09/07/2015, 8:01 AM   I spent a total of 40 Minutes    in face to face in clinical consultation, greater than 50% of which was counseling/coordinating care for T4 disc asp/praspinal abscess aspiration

## 2015-09-07 NOTE — Consult Note (Signed)
ANTIBIOTIC CONSULT NOTE - INITIAL  Pharmacy Consult for vancomycin Indication: epidural abscess, bacteremia  Allergies  Allergen Reactions  . Aspirin Other (See Comments)    MD told not to take due to crohns    Patient Measurements: Height: 5\' 6"  (167.6 cm) Weight: 148 lb (67.132 kg) IBW/kg (Calculated) : 59.3   Vital Signs: Temp: 98 F (36.7 C) (09/07 1411) Temp Source: Oral (09/07 1411) BP: 94/72 mmHg (09/07 1417) Pulse Rate: 98 (09/07 1417) Intake/Output from previous day:   Intake/Output from this shift: Total I/O In: 0  Out: 550 [Urine:550]  Labs:  Recent Labs  09/05/15 2024 09/06/15 1224 09/07/15 0539  WBC 9.0 9.4 7.1  HGB 11.9* 11.8* 11.5*  PLT 271 337 284  CREATININE 0.64 0.66 0.55   Estimated Creatinine Clearance: 73.5 mL/min (by C-G formula based on Cr of 0.55). No results for input(s): VANCOTROUGH, VANCOPEAK, VANCORANDOM, GENTTROUGH, GENTPEAK, GENTRANDOM, TOBRATROUGH, TOBRAPEAK, TOBRARND, AMIKACINPEAK, AMIKACINTROU, AMIKACIN in the last 72 hours.   Microbiology: Recent Results (from the past 720 hour(s))  Culture, blood (routine x 2)     Status: None   Collection Time: 08/11/15 12:56 AM  Result Value Ref Range Status   Specimen Description BLOOD L HAND  Final   Special Requests BOTTLES DRAWN AEROBIC AND ANAEROBIC  Final   Culture   Final    NO GROWTH 5 DAYS Performed at [facility]    Report Status 08/16/2015 FINAL  Final  Culture, blood (routine x 2)     Status: None   Collection Time: 08/11/15 12:56 AM  Result Value Ref Range Status   Specimen Description BLOOD L WRIST  Final   Special Requests BOTTLES DRAWN AEROBIC AND ANAEROBIC  Final   Culture   Final    NO GROWTH 5 DAYS Performed at [facility]    Report Status 08/16/2015 FINAL  Final  Urine culture     Status: None   Collection Time: 08/11/15  1:51 AM  Result Value Ref Range Status   Specimen Description URINE, CLEAN CATCH  Final   Special Requests Normal   Final   Culture   Final    MULTIPLE SPECIES PRESENT, SUGGEST RECOLLECTION Performed at [facility]    Report Status 08/13/2015 FINAL  Final  Blood culture (routine x 2)     Status: None (Preliminary result)   Collection Time: 09/06/15  4:39 PM  Result Value Ref Range Status   Specimen Description BLOOD LEFT ANTECUBITAL  Final   Special Requests BOTTLES DRAWN AEROBIC AND ANAEROBIC 5CC  Final   Culture  Setup Time   Final    GRAM POSITIVE COCCI IN CLUSTERS IN BOTH AEROBIC AND ANAEROBIC BOTTLES CRITICAL RESULT CALLED TO, READ BACK BY AND VERIFIED WITH: N ROBERTSON,RN AT 1215 09/07/15 BY L BENFIELD    Culture GRAM POSITIVE COCCI  Final   Report Status PENDING  Incomplete  Blood culture (routine x 2)     Status: None (Preliminary result)   Collection Time: 09/06/15  8:30 PM  Result Value Ref Range Status   Specimen Description BLOOD RIGHT HAND  Final   Special Requests IN PEDIATRIC BOTTLE 1CC  Final   Culture NO GROWTH < 24 HOURS  Final   Report Status PENDING  Incomplete    Medical History: Past Medical History  Diagnosis Date  . Crohn's disease   . Chronic back pain   . Prolonged QT interval 08/13/2015  . RBBB (right bundle branch block) 08/13/2015  . Schizophrenia  Assessment: 54 yo female complaining of back pain  PMH: IVDU, Schizo, Crohns  ID: t3/t4 spinal abscess. WBC 7.1, AF  Vancomycin 9/7>> Ceftriaxone 9/7>>  Blood x 2 9/6>> 1/2 GPC Pending Abscess aspiration  Renal: SCr 0.55  Goal of Therapy:  Vancomycin trough level 15-20 mcg/ml  Plan:  Antibiotics to start after aspiration Ceftriaxone per MD Vancomycin 1500 mg x 1 then 750 mg IV q12h Monitor clinical status, culture results, VT prn  Isaac Bliss, PharmD, BCPS Clinical Pharmacist Pager [phone] 09/07/2015 2:19 PM

## 2015-09-07 NOTE — Progress Notes (Signed)
During shift handoff with night nurse Orpha Bur, RN). Pt. Had c/o some chest pain which was followed by a cough. This RN retrieved an Facilities manager (I-S) and educated pt. On use. Pt. Informed me that she had previously had PNA. Pt. Was able to show return demonstration with family and Kemper Durie, Charity fundraiser at bedside. After reaching a goal of , pt. Began coughing. Informed Katy, RN of results and she informed this RN that she would continue to monitor. Pt. Also requested P.O pain medication, but due to pt. Unable to keep her eyes open and being able to recall history of nurse handoff, this RN held PRN dose of percocet. Informed Katy, RN and she will follow up for further care.

## 2015-09-07 NOTE — Progress Notes (Signed)
INFECTIOUS DISEASE PROGRESS NOTE  ID: Nicole Fitzgerald is a 54 y.o. female with  Active Problems:   Osteomyelitis   Paraspinal abscess  Subjective: In IR  Abtx:  Anti-infectives    None      Medications:  Scheduled: . ALPRAZolam  1 mg Oral QID  . fentaNYL      . gabapentin  100 mg Oral Q12H  . Influenza vac split quadrivalent PF  0.5 mL Intramuscular Tomorrow-1000  . lidocaine      . loratadine  10 mg Oral Daily  . mesalamine  1.2 g Oral BID  . midazolam      . OLANZapine  10 mg Oral QHS  . traZODone  100 mg Oral BID    Objective: Vital signs in last 24 hours: Temp:  [98.5 F (36.9 C)-99.9 F (37.7 C)] 98.5 F (36.9 C) (09/07 1311) Pulse Rate:  [69-96] 89 (09/07 1349) Resp:  [9-20] 12 (09/07 1349) BP: (95-165)/(57-98) 122/74 mmHg (09/07 1349) SpO2:  [96 %-100 %] 99 % (09/07 1349) Weight:  [67.132 kg (148 lb)] 67.132 kg (148 lb) (09/06 1850)   General appearance: no exam.   Lab Results  Recent Labs  09/06/15 1224 09/07/15 0539  WBC 9.4 7.1  HGB 11.8* 11.5*  HCT 37.1 35.6*  NA 136 134*  K 3.4* 3.5  CL 97* 97*  CO2 33* 28  BUN <5* <5*  CREATININE 0.66 0.55   Liver Panel  Recent Labs  09/07/15 0539  PROT 6.5  ALBUMIN 2.3*  AST 15  ALT 15  ALKPHOS 80  BILITOT 0.7   Sedimentation Rate No results for input(s): ESRSEDRATE in the last 72 hours. C-Reactive Protein No results for input(s): CRP in the last 72 hours.  Microbiology: Recent Results (from the past 240 hour(s))  Blood culture (routine x 2)     Status: None (Preliminary result)   Collection Time: 09/06/15  4:39 PM  Result Value Ref Range Status   Specimen Description BLOOD LEFT ANTECUBITAL  Final   Special Requests BOTTLES DRAWN AEROBIC AND ANAEROBIC 5CC  Final   Culture  Setup Time   Final    GRAM POSITIVE COCCI IN CLUSTERS IN BOTH AEROBIC AND ANAEROBIC BOTTLES CRITICAL RESULT CALLED TO, READ BACK BY AND VERIFIED WITH: N ROBERTSON,RN AT 1215 09/07/15 BY L BENFIELD    Culture GRAM  POSITIVE COCCI  Final   Report Status PENDING  Incomplete  Blood culture (routine x 2)     Status: None (Preliminary result)   Collection Time: 09/06/15  8:30 PM  Result Value Ref Range Status   Specimen Description BLOOD RIGHT HAND  Final   Special Requests IN PEDIATRIC BOTTLE 1CC  Final   Culture NO GROWTH < 24 HOURS  Final   Report Status PENDING  Incomplete    Studies/Results: Dg Chest 2 View  09/05/2015   CLINICAL DATA:  54 year old female with recurrent pneumonia like symptoms  EXAM: CHEST  2 VIEW  COMPARISON:  Prior chest x-ray 08/12/2015  FINDINGS: Developing linear opacities in the right lower lobe most consistent with areas of atelectasis or scarring. No focal airspace consolidation. Cardiac and mediastinal contours remain within normal limits. No pleural effusion or pneumothorax. No suspicious nodule or mass. No acute osseous abnormality.  IMPRESSION: Linear atelectasis versus scarring in the right lower lobe.   Electronically Signed   By: Malachy Moan M.D.   On: 09/05/2015 14:51   Ct Angio Chest Pe W/cm &/or Wo Cm  09/05/2015   CLINICAL DATA:  54 year old female with pain across the upper chest and shortness of breath  EXAM: CT ANGIOGRAPHY CHEST WITH CONTRAST  TECHNIQUE: Multidetector CT imaging of the chest was performed using the standard protocol during bolus administration of intravenous contrast. Multiplanar CT image reconstructions and MIPs were obtained to evaluate the vascular anatomy.  CONTRAST:  OMNIPAQUE IOHEXOL 350 MG/ML SOLN  COMPARISON:  Chest radiograph dated 09/05/2015  FINDINGS: Evaluation of this exam is limited due to respiratory motion artifact.  There is linea atelectasis/scarring in the lingula. There is partial consolidation of the right middle lobe possibly atelectasis versus pneumonia. Right lower lobe atelectatic changes noted. There is no pleural effusion or pneumothorax. The central airways are patent.  The visualized thoracic aorta appears  unremarkable. No CT evidence of pulmonary embolism. There is no cardiomegaly or pericardial effusion. There is no hilar adenopathy. Top-normal upper mediastinal lymph nodes noted. The thyroid gland appears unremarkable.  There is a bilobed appearing paraspinal soft tissue mass at T3-T4 most compatible with a neoplasm. The right lobe of the mass measures approximately 3.3 x 2.0 cm and the left lobe of the lesion measures approximately 2.9 x 1.2 cm. There is associated destructive changes and fracture of the inferior endplate of the T3 and superior endplate of T4 compatible with pathologic fracture. Differential includes a neurogenic tumor, non neurogenic tumors such as chondroid or osseous neoplasm, lymphoma. Other infectious process is not excluded. Clinical correlation is recommended. MRI without and with contrast is recommended for further characterization.  The visualized upper abdomen is unremarkable.  Review of the MIP images confirms the above findings.  IMPRESSION: No CT evidence of pulmonary embolism.  Bilobed paraspinal soft tissue mass centered at T3-T4 with mild destructive changes and pathologic fracture of the adjacent vertebra most compatible with neoplastic process. MRI is recommended for further characterization.   Electronically Signed   By: Elgie Collard M.D.   On: 09/05/2015 23:44   Mr Thoracic Spine W Wo Contrast  09/06/2015   CLINICAL DATA:  Two week history of cough and shortness of breath. Stabbing pain in the thoracic spinal region with inspiration and cough. Abnormal CT at T3-4  EXAM: MRI THORACIC SPINE WITHOUT AND WITH CONTRAST  TECHNIQUE: Multiplanar and multiecho pulse sequences of the thoracic spine were obtained without and with intravenous contrast.  CONTRAST:  13mL MULTIHANCE GADOBENATE DIMEGLUMINE 529 MG/ML IV SOLN  COMPARISON:  CT chest 09/05/2015  FINDINGS: Scout views in the cervical regions show degenerative spondylosis most pronounced at T5-6 and T6-7.  In the thoracic  region, there is no abnormal process centered at the T3-4 disc space with involvement of the T3 and T4 vertebral bodies and paravertebral soft tissues. The findings are most consistent with discitis osteomyelitis at this level. There is thickening and enhancement of the epidural tissues, resulting in constriction of the thecal sac and slight displacement and deformity of the cord. No definite abnormal cord signal. One could question early abnormal T2 signal. Paravertebral material is largely enhancing, though there is some nonenhancing fluid on the right consistent with abscess.  No involvement elsewhere in the thoracic region is identified. There is degenerative disc disease at T11-12 and T12-L1.  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 Paravertebral phlegmonous inflammation with early abscess formation on the right. Epidural phlegmonous inflammation with constriction of the thecal sac and slight deformity and displacement of the cord. Question early abnormal T2 signal in the cord. The  possibility of tumor is considered but felt less likely.  Critical Value/emergent results were called by telephone at the time of interpretation on 09/06/2015 at 3:22 pm to [facility] , who verbally acknowledged these results.   Electronically Signed   By: Paulina Fusi M.D.   On: 09/06/2015 15:27     Assessment/Plan: T3-4 Paraspinal abscess Crohn's Disease 1/2 BCx+  Not clear of significance of BCx+, could be contaminant or true from her spine.  Await aspirate and then start vanco/ceftriaxone (ordered).          Johny Sax Infectious Diseases (pager) [phone] [email or web address] 09/07/2015, 2:02 PM  LOS: 1 day

## 2015-09-07 NOTE — Sedation Documentation (Signed)
150cc fluid bolus in progress.

## 2015-09-07 NOTE — Progress Notes (Signed)
Overall stable area did back pain unchanged. Remains neurologically intact. Awaiting CT guided aspiration of paraspinal abscess. Antibodies pending after abscess sampling.

## 2015-09-08 LAB — CBC
HCT: 33 % — ABNORMAL LOW (ref 36.0–46.0)
Hemoglobin: 11.1 g/dL — ABNORMAL LOW (ref 12.0–15.0)
MCH: 28.9 pg (ref 26.0–34.0)
MCHC: 33.6 g/dL (ref 30.0–36.0)
MCV: 85.9 fL (ref 78.0–100.0)
PLATELETS: 212 10*3/uL (ref 150–400)
RBC: 3.84 MIL/uL — AB (ref 3.87–5.11)
RDW: 15.3 % (ref 11.5–15.5)
WBC: 4.9 10*3/uL (ref 4.0–10.5)

## 2015-09-08 LAB — MRSA PCR SCREENING: MRSA by PCR: NEGATIVE

## 2015-09-08 LAB — BASIC METABOLIC PANEL
ANION GAP: 6 (ref 5–15)
BUN: <5 mg/dL — ABNORMAL LOW (ref 6–20)
CALCIUM: 8.2 mg/dL — AB (ref 8.9–10.3)
CO2: 27 mmol/L (ref 22–32)
Chloride: 105 mmol/L (ref 101–111)
Creatinine, Ser: 0.55 mg/dL (ref 0.44–1.00)
GLUCOSE: 96 mg/dL (ref 65–99)
Potassium: 3.6 mmol/L (ref 3.5–5.1)
SODIUM: 138 mmol/L (ref 135–145)

## 2015-09-08 LAB — LACTIC ACID, PLASMA: Lactic Acid, Venous: 0.8 mmol/L (ref 0.5–2.0)

## 2015-09-08 LAB — C-REACTIVE PROTEIN: CRP: 6.8 mg/dL — ABNORMAL HIGH (ref <1.0)

## 2015-09-08 LAB — MAGNESIUM: MAGNESIUM: 1.7 mg/dL (ref 1.7–2.4)

## 2015-09-08 LAB — SEDIMENTATION RATE: SED RATE: 83 mm/h — AB (ref 0–22)

## 2015-09-08 LAB — HEPATITIS C ANTIBODY

## 2015-09-08 NOTE — Progress Notes (Signed)
INFECTIOUS DISEASE PROGRESS NOTE  ID: Nicole Fitzgerald is a 54 y.o. female with  Active Problems:   Osteomyelitis   Paraspinal abscess  Subjective: C/o back pain.   Abtx:  Anti-infectives    Start     Dose/Rate Route Frequency Ordered Stop   09/08/15 0400  vancomycin (VANCOCIN) IVPB 750 mg/150 ml premix     750 mg 150 mL/hr over 60 Minutes Intravenous Every 12 hours 09/07/15 1417     09/07/15 1600  vancomycin (VANCOCIN) 1,500 mg in sodium chloride 0.9 % 500 mL IVPB     1,500 mg 250 mL/hr over 120 Minutes Intravenous  Once 09/07/15 1417 09/07/15 1847   09/07/15 1415  cefTRIAXone (ROCEPHIN) 2 g in dextrose 5 % 50 mL IVPB     2 g 100 mL/hr over 30 Minutes Intravenous Every 24 hours 09/07/15 1410        Medications:  Scheduled: . ALPRAZolam  1 mg Oral QID  . cefTRIAXone (ROCEPHIN)  IV  2 g Intravenous Q24H  . gabapentin  100 mg Oral Q12H  . loratadine  10 mg Oral Daily  . mesalamine  1.2 g Oral BID  . OLANZapine  10 mg Oral QHS  . traZODone  100 mg Oral BID  . vancomycin  750 mg Intravenous Q12H    Objective: Vital signs in last 24 hours: Temp:  [98 F (36.7 C)-98.5 F (36.9 C)] 98.4 F (36.9 C) (09/08 0559) Pulse Rate:  [82-116] 88 (09/08 0559) Resp:  [9-20] 20 (09/08 0559) BP: (89-135)/(60-104) 135/81 mmHg (09/08 0559) SpO2:  [96 %-100 %] 99 % (09/08 0559)   General appearance: alert, cooperative and no distress Resp: clear to auscultation bilaterally Cardio: regular rate and rhythm GI: normal findings: bowel sounds normal and soft, non-tender Neurologic: Motor: 5/5 plantar strength  Lab Results  Recent Labs  09/07/15 0539 09/08/15 0827  WBC 7.1 4.9  HGB 11.5* 11.1*  HCT 35.6* 33.0*  NA 134* 138  K 3.5 3.6  CL 97* 105  CO2 28 27  BUN <5* <5*  CREATININE 0.55 0.55   Liver Panel  Recent Labs  09/07/15 0539  PROT 6.5  ALBUMIN 2.3*  AST 15  ALT 15  ALKPHOS 80  BILITOT 0.7   Sedimentation Rate No results for input(s): ESRSEDRATE in the last  72 hours. C-Reactive Protein  Recent Labs  09/08/15 0827  CRP 6.8*    Microbiology: Recent Results (from the past 240 hour(s))  Blood culture (routine x 2)     Status: None (Preliminary result)   Collection Time: 09/06/15  4:39 PM  Result Value Ref Range Status   Specimen Description BLOOD LEFT ANTECUBITAL  Final   Special Requests BOTTLES DRAWN AEROBIC AND ANAEROBIC 5CC  Final   Culture  Setup Time   Final    GRAM POSITIVE COCCI IN CLUSTERS IN BOTH AEROBIC AND ANAEROBIC BOTTLES CRITICAL RESULT CALLED TO, READ BACK BY AND VERIFIED WITH: N ROBERTSON,RN AT 1215 09/07/15 BY L BENFIELD    Culture   Final    STAPHYLOCOCCUS SPECIES (COAGULASE NEGATIVE) THE SIGNIFICANCE OF ISOLATING THIS ORGANISM FROM A SINGLE SET OF BLOOD CULTURES WHEN MULTIPLE SETS ARE DRAWN IS UNCERTAIN. PLEASE NOTIFY THE MICROBIOLOGY DEPARTMENT WITHIN ONE WEEK IF SPECIATION AND SENSITIVITIES ARE REQUIRED.    Report Status PENDING  Incomplete  Blood culture (routine x 2)     Status: None (Preliminary result)   Collection Time: 09/06/15  8:30 PM  Result Value Ref Range Status   Specimen Description BLOOD  RIGHT HAND  Final   Special Requests IN PEDIATRIC BOTTLE 1CC  Final   Culture NO GROWTH < 24 HOURS  Final   Report Status PENDING  Incomplete  Culture, routine-abscess     Status: None (Preliminary result)   Collection Time: 09/07/15  2:41 PM  Result Value Ref Range Status   Specimen Description ABSCESS BACK  Final   Special Requests CT GUIDED ASPIRATION OF PARASPINOUS ABSCESS T3 T4  Final   Gram Stain   Final    ABUNDANT WBC PRESENT, PREDOMINANTLY PMN NO SQUAMOUS EPITHELIAL CELLS SEEN RARE GRAM POSITIVE COCCI IN PAIRS Performed at [facility]    Culture PENDING  Incomplete   Report Status PENDING  Incomplete  Anaerobic culture     Status: None (Preliminary result)   Collection Time: 09/07/15  2:41 PM  Result Value Ref Range Status   Specimen Description ABSCESS BACK  Final   Special Requests CT  GUIDED ASPIRATION OF APRASPINOUS ABSCESS T3 T4  Final   Gram Stain   Final    ABUNDANT WBC PRESENT, PREDOMINANTLY PMN NO SQUAMOUS EPITHELIAL CELLS SEEN RARE GRAM POSITIVE COCCI IN PAIRS Performed at [facility]    Culture   Final    NO ANAEROBES ISOLATED; CULTURE IN PROGRESS FOR 5 DAYS Performed at [facility]    Report Status PENDING  Incomplete  Fungus culture w smear     Status: None (Preliminary result)   Collection Time: 09/07/15  2:41 PM  Result Value Ref Range Status   Specimen Description ABSCESS BACK  Final   Special Requests CT GUIDED ASPIRATION OF PARASPINOUS ABSCESS T3 T4  Final   Fungal Smear   Final    NO YEAST OR FUNGAL ELEMENTS SEEN Performed at [facility]    Culture   Final    CULTURE IN PROGRESS FOR FOUR WEEKS Performed at [facility]    Report Status PENDING  Incomplete    Studies/Results: Mr Thoracic Spine W Wo Contrast  09/06/2015   CLINICAL DATA:  Two week history of cough and shortness of breath. Stabbing pain in the thoracic spinal region with inspiration and cough. Abnormal CT at T3-4  EXAM: MRI THORACIC SPINE WITHOUT AND WITH CONTRAST  TECHNIQUE: Multiplanar and multiecho pulse sequences of the thoracic spine were obtained without and with intravenous contrast.  CONTRAST:  13mL MULTIHANCE GADOBENATE DIMEGLUMINE 529 MG/ML IV SOLN  COMPARISON:  CT chest 09/05/2015  FINDINGS: Scout views in the cervical regions show degenerative spondylosis most pronounced at T5-6 and T6-7.  In the thoracic region, there is no abnormal process centered at the T3-4 disc space with involvement of the T3 and T4 vertebral bodies and paravertebral soft tissues. The findings are most consistent with discitis osteomyelitis at this level. There is thickening and enhancement of the epidural tissues, resulting in constriction of the thecal sac and slight displacement and deformity of the cord. No definite abnormal cord signal. One could question early  abnormal T2 signal. Paravertebral material is largely enhancing, though there is some nonenhancing fluid on the right consistent with abscess.  No involvement elsewhere in the thoracic region is identified. There is degenerative disc disease at T11-12 and T12-L1.  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 Paravertebral phlegmonous inflammation with early abscess formation on the right. Epidural phlegmonous inflammation with constriction of the thecal sac and slight deformity and displacement of the cord. Question early abnormal  T2 signal in the cord. The possibility of tumor is considered but felt less likely.  Critical Value/emergent results were called by telephone at the time of interpretation on 09/06/2015 at 3:22 pm to [facility] , who verbally acknowledged these results.   Electronically Signed   By: Paulina Fusi M.D.   On: 09/06/2015 15:27   Ct Aspiration  09/07/2015   CLINICAL DATA:  Evidence of thoracic discitis and osteomyelitis at the T3-4 level by prior MRI and CT with abnormal paraspinous fluid/phlegmon present. Aspiration of the paraspinous soft tissues has been requested for diagnostic purposes of determining infectious organism.  EXAM: CT GUIDED ASPIRATION OF THORACIC PARASPINAL SOFT TISSUES  ANESTHESIA/SEDATION: 2.0  Mg IV Versed; 100 mcg IV Fentanyl  Total Moderate Sedation Time: 30 minutes.  PROCEDURE: The procedure risks, benefits, and alternatives were explained to the patient. Questions regarding the procedure were encouraged and answered. The patient understands and consents to the procedure.  The right posterior upper thoracic paraspinal region was prepped with Betadine in a sterile fashion, and a sterile drape was applied covering the operative field. A sterile gown and sterile gloves were used for the procedure. Local anesthesia was provided with 1% Lidocaine.  CT was performed in a prone  position. Under CT guidance, an 18 gauge trocar needle was advanced into the right-sided paraspinous soft tissues at the T3-4 level. Aspiration was performed. Aspirated fluid sample was sent for culture studies including a aerobic and anaerobic cultures and fungal culture.  COMPLICATIONS: None  FINDINGS: Aspiration of fluid in the right paraspinal region just opposite the inferior endplate of T3 yielded grossly purulent fluid. Approximately 2 mL of fluid was able to be aspirated.  IMPRESSION: CT-guided aspiration at the level of right paraspinous soft tissues at the T3-4 level yielding grossly purulent fluid. 2 mL of fluid was able to be aspirated and sent for culture analysis.   Electronically Signed   By: Irish Lack M.D.   On: 09/07/2015 17:33     Assessment/Plan: T3-4 Paraspinal abscess Crohn's Disease 1/2 BCx+ CNS CP  Await growth of her aspirate. Gram stain shows GPC.  Will stop ceftriaxone Significance of CNS is unlcear unless also found on aspirate.  Place PIC Defer to PCP to manage CP  Total days of antibiotics: 1 ceftriaxone/vanco         Johny Sax Infectious Diseases (pager) [phone] [email or web address] 09/08/2015, 11:09 AM  LOS: 2 days

## 2015-09-08 NOTE — Progress Notes (Signed)
PROGRESS NOTE  Nicole Fitzgerald ZOX:[id number] DOB: [birth year] DOA: 09/06/2015 PCP: No primary care provider on file.  HPI/Recap of past 24 hours:  S/p CT guided  Aspiration of thoracic paraspinal abscess on 9/7, started on abx,    Assessment/Plan: Active Problems:   Osteomyelitis   Paraspinal abscess  T3-t4 paraspinal abscess:  S/p CT guided aspiration of 9/7, start vanc/rocephin on 9/7,  no fever, vital stable so far, no significant neurologic impairment  follow up on cultures ( blood and aspirate), prn pain meds, preliminary culture +staph, rocephin d/ced and vanc continued. If blood culture positive may need TEE. Appreciate ID/ neurosurgery/IR input.  H/o crohn's disease, currently denies ab pain ,continue home meds.  H/o schizophrenia, currently stable, denies hallucination, no frank psychosis, continue home meds.  H/O IV drug use, reported quit 2months ago. Noticed recently finished detox with clonidine protocol at [facility] long.  H/o smoking, reported quit a few weeks ago.  DVT prophylaxis: scd  Consultants:  IR ID neurosurgery  Code Status: full   Family Communication: Patient    Disposition Plan: remain in the hospital    Procedures:  CT guided aspiration of t3-t4 paraspinal abscess 9/7  picc placement  Antibiotics:  Rocephin x1 on 9/7.  Vanc after spine aspiration of 9/7   Objective: BP 152/99 mmHg  Pulse 100  Temp(Src) 98.1 F (36.7 C) (Oral)  Resp 20  Ht 5\' 6"  (1.676 m)  Wt 148 lb (67.132 kg)  BMI 23.90 kg/m2  SpO2 99%  Intake/Output Summary (Last 24 hours) at 09/08/15 1647 Last data filed at 09/08/15 1622  Gross per 24 hour  Intake 2153.25 ml  Output   2150 ml  Net   3.25 ml   Filed Weights   09/06/15 1024 09/06/15 1850  Weight: 48 lb (21.773 kg) 148 lb (67.132 kg)    Exam:   General:  NAD  Cardiovascular: RRR  Respiratory: CTABL  Abdomen: Soft/ND/NT, positive BS  Musculoskeletal: back pain, tender to palpation, No  lower extremity Edema  Neuro: aaox3, calm and cooperative  Data Reviewed: Basic Metabolic Panel:  Recent Labs Lab 09/05/15 2024 09/06/15 1224 09/07/15 0539 09/08/15 0827  NA 137 136 134* 138  K 3.5 3.4* 3.5 3.6  CL 101 97* 97* 105  CO2 28 33* 28 27  GLUCOSE 67 81 64* 96  BUN 6 <5* <5* <5*  CREATININE 0.64 0.66 0.55 0.55  CALCIUM 8.4* 8.4* 8.1* 8.2*  MG  --   --   --  1.7   Liver Function Tests:  Recent Labs Lab 09/07/15 0539  AST 15  ALT 15  ALKPHOS 80  BILITOT 0.7  PROT 6.5  ALBUMIN 2.3*   No results for input(s): LIPASE, AMYLASE in the last 168 hours. No results for input(s): AMMONIA in the last 168 hours. CBC:  Recent Labs Lab 09/05/15 2024 09/06/15 1224 09/07/15 0539 09/08/15 0827  WBC 9.0 9.4 7.1 4.9  HGB 11.9* 11.8* 11.5* 11.1*  HCT 36.5 37.1 35.6* 33.0*  MCV 89.0 88.3 87.3 85.9  PLT 271 337 284 212   Cardiac Enzymes:   No results for input(s): CKTOTAL, CKMB, CKMBINDEX, TROPONINI in the last 168 hours. BNP (last 3 results) No results for input(s): BNP in the last 8760 hours.  ProBNP (last 3 results) No results for input(s): PROBNP in the last 8760 hours.  CBG: No results for input(s): GLUCAP in the last 168 hours.  Recent Results (from the past 240 hour(s))  Blood culture (routine x 2)  Status: None (Preliminary result)   Collection Time: 09/06/15  4:39 PM  Result Value Ref Range Status   Specimen Description BLOOD LEFT ANTECUBITAL  Final   Special Requests BOTTLES DRAWN AEROBIC AND ANAEROBIC 5CC  Final   Culture  Setup Time   Final    GRAM POSITIVE COCCI IN CLUSTERS IN BOTH AEROBIC AND ANAEROBIC BOTTLES CRITICAL RESULT CALLED TO, READ BACK BY AND VERIFIED WITH: N ROBERTSON,RN AT 1215 09/07/15 BY L BENFIELD    Culture STAPHYLOCOCCUS SPECIES (COAGULASE NEGATIVE)  Final   Report Status PENDING  Incomplete  Blood culture (routine x 2)     Status: None (Preliminary result)   Collection Time: 09/06/15  8:30 PM  Result Value Ref Range  Status   Specimen Description BLOOD RIGHT HAND  Final   Special Requests IN PEDIATRIC BOTTLE 1CC  Final   Culture NO GROWTH 2 DAYS  Final   Report Status PENDING  Incomplete  Culture, routine-abscess     Status: None (Preliminary result)   Collection Time: 09/07/15  2:41 PM  Result Value Ref Range Status   Specimen Description ABSCESS BACK  Final   Special Requests CT GUIDED ASPIRATION OF PARASPINOUS ABSCESS T3 T4  Final   Gram Stain   Final    ABUNDANT WBC PRESENT, PREDOMINANTLY PMN NO SQUAMOUS EPITHELIAL CELLS SEEN RARE GRAM POSITIVE COCCI IN PAIRS Performed at [facility]    Culture PENDING  Incomplete   Report Status PENDING  Incomplete  Anaerobic culture     Status: None (Preliminary result)   Collection Time: 09/07/15  2:41 PM  Result Value Ref Range Status   Specimen Description ABSCESS BACK  Final   Special Requests CT GUIDED ASPIRATION OF APRASPINOUS ABSCESS T3 T4  Final   Gram Stain   Final    ABUNDANT WBC PRESENT, PREDOMINANTLY PMN NO SQUAMOUS EPITHELIAL CELLS SEEN RARE GRAM POSITIVE COCCI IN PAIRS Performed at [facility]    Culture   Final    NO ANAEROBES ISOLATED; CULTURE IN PROGRESS FOR 5 DAYS Performed at [facility]    Report Status PENDING  Incomplete  Fungus culture w smear     Status: None (Preliminary result)   Collection Time: 09/07/15  2:41 PM  Result Value Ref Range Status   Specimen Description ABSCESS BACK  Final   Special Requests CT GUIDED ASPIRATION OF PARASPINOUS ABSCESS T3 T4  Final   Fungal Smear   Final    NO YEAST OR FUNGAL ELEMENTS SEEN Performed at [facility]    Culture   Final    CULTURE IN PROGRESS FOR FOUR WEEKS Performed at [facility]    Report Status PENDING  Incomplete     Studies: No results found.  Scheduled Meds: . ALPRAZolam  1 mg Oral QID  . gabapentin  100 mg Oral Q12H  . loratadine  10 mg Oral Daily  . mesalamine  1.2 g Oral BID  . OLANZapine  10 mg Oral QHS  .  traZODone  100 mg Oral BID  . vancomycin  750 mg Intravenous Q12H    Continuous Infusions: . dextrose 5 % and 0.9 % NaCl with KCl 20 mEq/L 75 mL/hr (09/07/15 1534)     Time spent:  Alawna Graybeal MD, PhD  [facility] Pager [phone]. If 7PM-7AM, please contact night-coverage at [email or web address], password [facility] 09/08/2015, 4:47 PM  LOS: 2 days

## 2015-09-08 NOTE — Evaluation (Signed)
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 PMH - Chron's, schizophrenia, PNA, IV drug use  Clinical Impression  Pt admitted with above diagnosis and presents to PT with functional limitations due to deficits listed below (See PT problem list). Pt needs skilled PT to maximize independence and safety to allow discharge to ST-SNF.      Follow Up Recommendations SNF    Equipment Recommendations  None recommended by PT    Recommendations for Other Services       Precautions / Restrictions Precautions Precautions: Fall      Mobility  Bed Mobility Overal bed mobility: Needs Assistance Bed Mobility: Sidelying to Sit;Sit to Supine   Sidelying to sit: Min assist   Sit to supine: Min assist   General bed mobility comments: Assist to bring trunk up.  Transfers Overall transfer level: Needs assistance Equipment used: 4-wheeled walker Transfers: Sit to/from Stand Sit to Stand: Min guard         General transfer comment: Assist for balance  Ambulation/Gait Ambulation/Gait assistance: Min guard Ambulation Distance (Feet): 25 Feet Assistive device: 4-wheeled walker Gait Pattern/deviations: Step-through pattern;Decreased step length - right;Decreased step length - left;Trunk flexed Gait velocity: decr Gait velocity interpretation: Below normal speed for age/gender General Gait Details: assist for balance  Stairs            Wheelchair Mobility    Modified Rankin (Stroke Patients Only)       Balance Overall balance assessment: Needs assistance Sitting-balance support: No upper extremity supported;Feet supported Sitting balance-Leahy Scale: Good     Standing balance support: No upper extremity supported;During functional activity Standing balance-Leahy Scale: Fair                               Pertinent  Vitals/Pain Pain Assessment: Faces Faces Pain Scale: Hurts whole lot Pain Location: back Pain Descriptors / Indicators: Grimacing;Guarding Pain Intervention(s): Limited activity within patient's tolerance;Monitored during session;Repositioned;Premedicated before session    Home Living Family/patient expects to be discharged to:: Private residence Living Arrangements: Children Available Help at Discharge: Family Type of Home: House Home Access: Stairs to enter Entrance Stairs-Rails: Right Entrance Stairs-Number of Steps: 4 Home Layout: One level Home Equipment: Environmental consultant - 4 wheels      Prior Function Level of Independence: Independent with assistive device(s)               Hand Dominance        Extremity/Trunk Assessment   Upper Extremity Assessment: Overall WFL for tasks assessed           Lower Extremity Assessment: Generalized weakness         Communication   Communication: No difficulties  Cognition Arousal/Alertness: Awake/alert Behavior During Therapy: WFL for tasks assessed/performed Overall Cognitive Status: Within Functional Limits for tasks assessed                      General Comments      Exercises        Assessment/Plan    PT Assessment Patient needs continued PT services  PT Diagnosis Difficulty walking;Generalized weakness;Acute pain   PT Problem List Decreased strength;Decreased activity tolerance;Decreased balance;Decreased mobility;Pain  PT Treatment Interventions DME instruction;Gait training;Functional mobility training;Therapeutic activities;Therapeutic exercise;Balance training;Patient/family education   PT Goals (Current goals can be found in the Care Plan section)  Acute Rehab PT Goals Patient Stated Goal: Decr back pain PT Goal Formulation: With patient Time For Goal Achievement: 09/22/15 Potential to Achieve Goals: Good    Frequency Min 2X/week   Barriers to discharge        Co-evaluation                End of Session   Activity Tolerance: Patient limited by pain Patient left: in bed;with call bell/phone within reach;with bed alarm set           Time: 1610-9604 PT Time Calculation (min) (ACUTE ONLY): 13 min   Charges:   PT Evaluation $Initial PT Evaluation Tier I: 1 Procedure     PT G Codes:        Harman Ferrin Oct 04, 2015, 3:09 PM  [facility] PT [phone]

## 2015-09-09 DIAGNOSIS — B1920 Unspecified viral hepatitis C without hepatic coma: Secondary | ICD-10-CM

## 2015-09-09 DIAGNOSIS — F1910 Other psychoactive substance abuse, uncomplicated: Secondary | ICD-10-CM

## 2015-09-09 DIAGNOSIS — R079 Chest pain, unspecified: Secondary | ICD-10-CM

## 2015-09-09 DIAGNOSIS — B9561 Methicillin susceptible Staphylococcus aureus infection as the cause of diseases classified elsewhere: Secondary | ICD-10-CM

## 2015-09-09 DIAGNOSIS — F1990 Other psychoactive substance use, unspecified, uncomplicated: Secondary | ICD-10-CM | POA: Diagnosis present

## 2015-09-09 LAB — BASIC METABOLIC PANEL
ANION GAP: 7 (ref 5–15)
BUN: <5 mg/dL — ABNORMAL LOW (ref 6–20)
CHLORIDE: 104 mmol/L (ref 101–111)
CO2: 27 mmol/L (ref 22–32)
Calcium: 8.3 mg/dL — ABNORMAL LOW (ref 8.9–10.3)
Creatinine, Ser: 0.54 mg/dL (ref 0.44–1.00)
GFR calc Af Amer: >60 mL/min (ref >60)
GLUCOSE: 107 mg/dL — AB (ref 65–99)
POTASSIUM: 3.6 mmol/L (ref 3.5–5.1)
Sodium: 138 mmol/L (ref 135–145)

## 2015-09-09 LAB — CBC WITH DIFFERENTIAL/PLATELET
BASOS ABS: 0 10*3/uL (ref 0.0–0.1)
Basophils Relative: 0 % (ref 0–1)
Eosinophils Absolute: 0.1 10*3/uL (ref 0.0–0.7)
Eosinophils Relative: 1 % (ref 0–5)
HEMATOCRIT: 34.7 % — AB (ref 36.0–46.0)
HEMOGLOBIN: 11.4 g/dL — AB (ref 12.0–15.0)
LYMPHS PCT: 33 % (ref 12–46)
Lymphs Abs: 1.9 10*3/uL (ref 0.7–4.0)
MCH: 28.4 pg (ref 26.0–34.0)
MCHC: 32.9 g/dL (ref 30.0–36.0)
MCV: 86.3 fL (ref 78.0–100.0)
Monocytes Absolute: 0.7 10*3/uL (ref 0.1–1.0)
Monocytes Relative: 12 % (ref 3–12)
NEUTROS ABS: 3.1 10*3/uL (ref 1.7–7.7)
Neutrophils Relative %: 54 % (ref 43–77)
Platelets: 223 10*3/uL (ref 150–400)
RBC: 4.02 MIL/uL (ref 3.87–5.11)
RDW: 15 % (ref 11.5–15.5)
WBC: 5.7 10*3/uL (ref 4.0–10.5)

## 2015-09-09 LAB — MAGNESIUM: Magnesium: 1.5 mg/dL — ABNORMAL LOW (ref 1.7–2.4)

## 2015-09-09 LAB — CULTURE, BLOOD (ROUTINE X 2)

## 2015-09-09 MED ORDER — METOPROLOL TARTRATE 12.5 MG HALF TABLET
12.5000 mg | ORAL_TABLET | Freq: Two times a day (BID) | ORAL | Status: DC
  Administered 2015-09-09 – 2015-09-12 (×6): 12.5 mg via ORAL
  Filled 2015-09-09 (×6): qty 1

## 2015-09-09 MED ORDER — MORPHINE SULFATE (PF) 2 MG/ML IV SOLN
1.0000 mg | INTRAVENOUS | Status: DC | PRN
  Administered 2015-09-09 – 2015-09-10 (×4): 1 mg via INTRAVENOUS
  Filled 2015-09-09 (×3): qty 1

## 2015-09-09 MED ORDER — SODIUM CHLORIDE 0.9 % IJ SOLN
10.0000 mL | INTRAMUSCULAR | Status: DC | PRN
  Administered 2015-09-12: 10 mL
  Filled 2015-09-09: qty 40

## 2015-09-09 MED ORDER — MAGNESIUM SULFATE 2 GM/50ML IV SOLN
2.0000 g | Freq: Once | INTRAVENOUS | Status: AC
  Administered 2015-09-09: 2 g via INTRAVENOUS
  Filled 2015-09-09: qty 50

## 2015-09-09 NOTE — Progress Notes (Signed)
Overall stable. Still complains of back pain. No new complaints of numbness, paresthesias and weakness. On examination she is awake and alert. She is oriented and appropriate. Motor and sensory function are intact to direct testing.  Cultures pending. Continue IV harmonics for T3-T4 osteomyelitis discitis

## 2015-09-09 NOTE — Care Management Note (Signed)
Case Management Note  Patient Details  Name: Ariyanah Simms MRN: [id number] Date of Birth: 13-Mar-1961  Subjective/Objective:  Patient is for SNF, CSW is aware.                 Action/Plan:   Expected Discharge Date:                  Expected Discharge Plan:  Skilled Nursing Facility  In-House Referral:  Clinical Social Work  Discharge planning Services  CM Consult  Post Acute Care Choice:    Choice offered to:     DME Arranged:    DME Agency:     HH Arranged:    HH Agency:     Status of Service:  Completed, signed off  Medicare Important Message Given:  Yes-second notification given Date Medicare IM Given:    Medicare IM give by:    Date Additional Medicare IM Given:    Additional Medicare Important Message give by:     If discussed at Long Length of Stay Meetings, dates discussed:    Additional Comments:  Leone Haven, RN 09/09/2015, 4:32 PM

## 2015-09-09 NOTE — Care Management Important Message (Signed)
Important Message  Patient Details  Name: Zinachidi Slusarz MRN: [id number] Date of Birth: June 20, 1961   Medicare Important Message Given:  Yes-second notification given    Kyla Balzarine 09/09/2015, 12:01 PMImportant Message  Patient Details  Name: Naara Hammen MRN: [id number] Date of Birth: 12-20-1961   Medicare Important Message Given:  Yes-second notification given    Kyla Balzarine 09/09/2015, 12:01 PM

## 2015-09-09 NOTE — Clinical Social Work Note (Signed)
Clinical Social Work Assessment  Patient Details  Name: Nicole Fitzgerald MRN: [id number] Date of Birth: Oct 05, 1961  Date of referral:  09/09/15               Reason for consult:  Facility Placement, Discharge Planning                Permission sought to share information with:  Facility Art therapist granted to share information::  Yes, Verbal Permission Granted  Name::     SNFs  Agency::     Relationship::     Contact Information:     Housing/Transportation Living arrangements for the past 2 months:  [place] of Information:  Patient Patient Interpreter Needed:  None Criminal Activity/Legal Involvement Pertinent to Current Situation/Hospitalization:  No - Comment as needed Significant Relationships:  Adult Children Lives with:  Self Do you feel safe going back to the place where you live?  Yes Need for family participation in patient care:  Yes (Comment)  Care giving concerns:  Patient complains about having to go to SNF for IV antibiotics.   Social Worker assessment / plan:  CSW met with patient at bedside to complete assessment. Patient states that she does not want to go to a SNF for IV antibiotics and she claims that she was told she could go home with her PICC for long term IV antibiotics 2/2 IVDU history. Patient claims to have stopped using IV drugs within the past "few" months. Patient is agreeable to see what her SNF options will be, but the team will need to keep encouraging her to accept placement for her antibiotic therapy. CSW has explained that the patient will need to go to SNF if she is going to receive the treatment she needs. CSW explained placement process. CSW will followup with bed offers.  Employment status:  Disabled (Comment on whether or not currently receiving Disability) Insurance information:  Programmer, applications, Medicaid In [place] PT Recommendations:  [place] / Referral to community resources:   [place]  Patient/Family's Response to care:  Patient appears irritable, but does not have any complaints.  Patient/Family's Understanding of and Emotional Response to Diagnosis, Current Treatment, and Prognosis:  Patient appears to have good understanding of reason for admission, but seems to be unrealistic about her discharge plan.   Emotional Assessment Appearance:  Appears stated age Attitude/Demeanor/Rapport:  Complaining Affect (typically observed):  Restless, Irritable Orientation:  Oriented to Self, Oriented to Place, Oriented to  Time, Oriented to Situation Alcohol / Substance use:  Illicit Drugs, Tobacco Use Psych involvement (Current and /or in the community):  No (Comment)  Discharge Needs  Concerns to be addressed:  Discharge Planning Concerns Readmission within the last 30 days:  Yes Current discharge risk:  Physical Impairment, Substance Abuse Barriers to Discharge:  Continued Medical Work up   [employer] MSW, [place], [place], [phone]

## 2015-09-09 NOTE — Clinical Social Work Placement (Signed)
   CLINICAL SOCIAL WORK PLACEMENT  NOTE  Date:  09/09/2015  Patient Details  Name: Nicole Fitzgerald MRN: [id number] Date of Birth: [birth year]  Clinical Social Work is seeking post-discharge placement for this patient at the Skilled  Nursing Facility level of care (*CSW will initial, date and re-position this form in  chart as items are completed):  Yes   Patient/family provided with [place] Clinical Social Work Department's list of facilities offering this level of care within the geographic area requested by the patient (or if unable, by the patient's family).  Yes   Patient/family informed of their freedom to choose among providers that offer the needed level of care, that participate in Medicare, Medicaid or managed care program needed by the patient, have an available bed and are willing to accept the patient.  Yes   Patient/family informed of [facility]'s ownership interest in [facility] and [facility], as well as of the fact that they are under no obligation to receive care at these facilities.  PASRR submitted to EDS on       PASRR number received on       Existing PASRR number confirmed on 09/09/15 (Expires 9/14, facilities made aware.)     FL2 transmitted to all facilities in geographic area requested by pt/family on 09/09/15     FL2 transmitted to all facilities within larger geographic area on       Patient informed that his/her managed care company has contracts with or will negotiate with certain facilities, including the following:            Patient/family informed of bed offers received.  Patient chooses bed at       Physician recommends and patient chooses bed at      Patient to be transferred to   on  .  Patient to be transferred to facility by       Patient family notified on   of transfer.  Name of family member notified:        PHYSICIAN       Additional Comment:    _______________________________________________ Roddie Mc MSW,  [place], [place], [phone]

## 2015-09-09 NOTE — Progress Notes (Signed)
Peripherally Inserted Central Catheter/Midline Placement  The IV Nurse has discussed with the patient and/or persons authorized to consent for the patient, the purpose of this procedure and the potential benefits and risks involved with this procedure.  The benefits include less needle sticks, lab draws from the catheter and patient may be discharged home with the catheter.  Risks include, but not limited to, infection, bleeding, blood clot (thrombus formation), and puncture of an artery; nerve damage and irregular heat beat.  Alternatives to this procedure were also discussed.  PICC/Midline Placement Documentation        Nicole Fitzgerald 09/09/2015, 10:32 AM

## 2015-09-09 NOTE — Progress Notes (Signed)
INFECTIOUS DISEASE PROGRESS NOTE  ID: Nicole Fitzgerald is a 54 y.o. female with  Active Problems:   Osteomyelitis   Paraspinal abscess  Subjective: Not feeling well, on commode   Abtx:  Anti-infectives    Start     Dose/Rate Route Frequency Ordered Stop   09/08/15 0400  vancomycin (VANCOCIN) IVPB 750 mg/150 ml premix     750 mg 150 mL/hr over 60 Minutes Intravenous Every 12 hours 09/07/15 1417     09/07/15 1600  vancomycin (VANCOCIN) 1,500 mg in sodium chloride 0.9 % 500 mL IVPB     1,500 mg 250 mL/hr over 120 Minutes Intravenous  Once 09/07/15 1417 09/07/15 1847   09/07/15 1415  cefTRIAXone (ROCEPHIN) 2 g in dextrose 5 % 50 mL IVPB  Status:  Discontinued     2 g 100 mL/hr over 30 Minutes Intravenous Every 24 hours 09/07/15 1410 09/08/15 1113      Medications:  Scheduled: . ALPRAZolam  1 mg Oral QID  . gabapentin  100 mg Oral Q12H  . loratadine  10 mg Oral Daily  . mesalamine  1.2 g Oral BID  . metoprolol tartrate  12.5 mg Oral BID  . OLANZapine  10 mg Oral QHS  . traZODone  100 mg Oral BID  . vancomycin  750 mg Intravenous Q12H    Objective: Vital signs in last 24 hours: Temp:  [98.1 F (36.7 C)-99.2 F (37.3 C)] 99.2 F (37.3 C) (09/09 0518) Pulse Rate:  [89-100] 89 (09/09 0910) Resp:  [18-22] 18 (09/09 0518) BP: (142-190)/(78-101) 142/78 mmHg (09/09 0910) SpO2:  [98 %-100 %] 98 % (09/09 0518)   General appearance: alert, cooperative and mild distress  Lab Results  Recent Labs  09/08/15 0827 09/09/15 0704  WBC 4.9 5.7  HGB 11.1* 11.4*  HCT 33.0* 34.7*  NA 138 138  K 3.6 3.6  CL 105 104  CO2 27 27  BUN <5* <5*  CREATININE 0.55 0.54   Liver Panel  Recent Labs  09/07/15 0539  PROT 6.5  ALBUMIN 2.3*  AST 15  ALT 15  ALKPHOS 80  BILITOT 0.7   Sedimentation Rate  Recent Labs  09/08/15 0827  ESRSEDRATE 83*   C-Reactive Protein  Recent Labs  09/08/15 0827  CRP 6.8*    Microbiology: Recent Results (from the past 240 hour(s))    Blood culture (routine x 2)     Status: None   Collection Time: 09/06/15  4:39 PM  Result Value Ref Range Status   Specimen Description BLOOD LEFT ANTECUBITAL  Final   Special Requests BOTTLES DRAWN AEROBIC AND ANAEROBIC 5CC  Final   Culture  Setup Time   Final    GRAM POSITIVE COCCI IN CLUSTERS IN BOTH AEROBIC AND ANAEROBIC BOTTLES CRITICAL RESULT CALLED TO, READ BACK BY AND VERIFIED WITH: N ROBERTSON,RN AT 1215 09/07/15 BY L BENFIELD    Culture STAPHYLOCOCCUS SPECIES (COAGULASE NEGATIVE)  Final   Report Status 09/09/2015 FINAL  Final   Organism ID, Bacteria STAPHYLOCOCCUS SPECIES (COAGULASE NEGATIVE)  Final      Susceptibility   Staphylococcus species (coagulase negative) - MIC*    CIPROFLOXACIN <=0.5 SENSITIVE Sensitive     ERYTHROMYCIN 0.5 SENSITIVE Sensitive     GENTAMICIN <=0.5 SENSITIVE Sensitive     OXACILLIN <=0.25 SENSITIVE Sensitive     TETRACYCLINE 2 SENSITIVE Sensitive     VANCOMYCIN 1 SENSITIVE Sensitive     TRIMETH/SULFA <=10 SENSITIVE Sensitive     CLINDAMYCIN <=0.25 SENSITIVE Sensitive  RIFAMPIN <=0.5 SENSITIVE Sensitive     Inducible Clindamycin NEGATIVE Sensitive     * STAPHYLOCOCCUS SPECIES (COAGULASE NEGATIVE)  Blood culture (routine x 2)     Status: None (Preliminary result)   Collection Time: 09/06/15  8:30 PM  Result Value Ref Range Status   Specimen Description BLOOD RIGHT HAND  Final   Special Requests IN PEDIATRIC BOTTLE 1CC  Final   Culture NO GROWTH 2 DAYS  Final   Report Status PENDING  Incomplete  Culture, routine-abscess     Status: None (Preliminary result)   Collection Time: 09/07/15  2:41 PM  Result Value Ref Range Status   Specimen Description ABSCESS BACK  Final   Special Requests CT GUIDED ASPIRATION OF PARASPINOUS ABSCESS T3 T4  Final   Gram Stain   Final    ABUNDANT WBC PRESENT, PREDOMINANTLY PMN NO SQUAMOUS EPITHELIAL CELLS SEEN RARE GRAM POSITIVE COCCI IN PAIRS Performed at [facility]    Culture   Final    MODERATE  STAPHYLOCOCCUS AUREUS Note: RIFAMPIN AND GENTAMICIN SHOULD NOT BE USED AS SINGLE DRUGS FOR TREATMENT OF STAPH INFECTIONS. Performed at [facility]    Report Status PENDING  Incomplete  Anaerobic culture     Status: None (Preliminary result)   Collection Time: 09/07/15  2:41 PM  Result Value Ref Range Status   Specimen Description ABSCESS BACK  Final   Special Requests CT GUIDED ASPIRATION OF APRASPINOUS ABSCESS T3 T4  Final   Gram Stain   Final    ABUNDANT WBC PRESENT, PREDOMINANTLY PMN NO SQUAMOUS EPITHELIAL CELLS SEEN RARE GRAM POSITIVE COCCI IN PAIRS Performed at [facility]    Culture   Final    NO ANAEROBES ISOLATED; CULTURE IN PROGRESS FOR 5 DAYS Performed at [facility]    Report Status PENDING  Incomplete  Fungus culture w smear     Status: None (Preliminary result)   Collection Time: 09/07/15  2:41 PM  Result Value Ref Range Status   Specimen Description ABSCESS BACK  Final   Special Requests CT GUIDED ASPIRATION OF PARASPINOUS ABSCESS T3 T4  Final   Fungal Smear   Final    NO YEAST OR FUNGAL ELEMENTS SEEN Performed at [facility]    Culture   Final    CULTURE IN PROGRESS FOR FOUR WEEKS Performed at [facility]    Report Status PENDING  Incomplete  MRSA PCR Screening     Status: None   Collection Time: 09/08/15  6:28 PM  Result Value Ref Range Status   MRSA by PCR NEGATIVE NEGATIVE Final    Comment:        The GeneXpert MRSA Assay (FDA approved for NASAL specimens only), is one component of a comprehensive MRSA colonization surveillance program. It is not intended to diagnose MRSA infection nor to guide or monitor treatment for MRSA infections.     Studies/Results: Ct Aspiration  09/07/2015   CLINICAL DATA:  Evidence of thoracic discitis and osteomyelitis at the T3-4 level by prior MRI and CT with abnormal paraspinous fluid/phlegmon present. Aspiration of the paraspinous soft tissues has been requested for  diagnostic purposes of determining infectious organism.  EXAM: CT GUIDED ASPIRATION OF THORACIC PARASPINAL SOFT TISSUES  ANESTHESIA/SEDATION: 2.0  Mg IV Versed; 100 mcg IV Fentanyl  Total Moderate Sedation Time: 30 minutes.  PROCEDURE: The procedure risks, benefits, and alternatives were explained to the patient. Questions regarding the procedure were encouraged and answered. The patient understands and consents to  the procedure.  The right posterior upper thoracic paraspinal region was prepped with Betadine in a sterile fashion, and a sterile drape was applied covering the operative field. A sterile gown and sterile gloves were used for the procedure. Local anesthesia was provided with 1% Lidocaine.  CT was performed in a prone position. Under CT guidance, an 18 gauge trocar needle was advanced into the right-sided paraspinous soft tissues at the T3-4 level. Aspiration was performed. Aspirated fluid sample was sent for culture studies including a aerobic and anaerobic cultures and fungal culture.  COMPLICATIONS: None  FINDINGS: Aspiration of fluid in the right paraspinal region just opposite the inferior endplate of T3 yielded grossly purulent fluid. Approximately 2 mL of fluid was able to be aspirated.  IMPRESSION: CT-guided aspiration at the level of right paraspinous soft tissues at the T3-4 level yielding grossly purulent fluid. 2 mL of fluid was able to be aspirated and sent for culture analysis.   Electronically Signed   By: Irish Lack M.D.   On: 09/07/2015 17:33     Assessment/Plan: T3-4 Paraspinal abscess Crohn's Disease 1/2 BCx+ CNS CP Hep C  Abscess growing Staph aureus Await sensi Suspect CNS is contaminant Await Hep C genotype Plan for rehab for IV rx.      Johny Sax Infectious Diseases (pager) [phone] [email or web address] 09/09/2015, 12:13 PM  LOS: 3 days

## 2015-09-09 NOTE — Progress Notes (Signed)
PROGRESS NOTE  Nicole Fitzgerald ZOX:[id number] DOB: [birth year] DOA: 09/06/2015 PCP: No primary care provider on file.  HPI/Recap of past 24 hours:  No fever, no neurological deficit, c/o back pain,    Assessment/Plan: Active Problems:   Osteomyelitis   Paraspinal abscess  T3-t4 paraspinal abscess:  S/p CT guided aspiration of 9/7, start vanc/rocephin on 9/7,  no fever, vital stable so far, no significant neurologic impairment  prn pain meds,  Blood culture 1/2+ CNS, likely contamination, abscess culture + staph aureus, on vanc only since 9/8 picc line placed, will need to be discharged to snf for long term iv abx treatment (h/o IVDU) Appreciate ID/ neurosurgery/IR input.  H/o crohn's disease, currently denies ab pain ,continue home meds.  H/o schizophrenia, currently stable, denies hallucination, no frank psychosis, continue home meds.  H/O IV drug use, reported quit 2months ago. Noticed recently finished detox with clonidine protocol at [facility] long.  H/o smoking, reported quit a few weeks ago.  DVT prophylaxis: scd  Consultants:  IR ID neurosurgery  Code Status: full   Family Communication: Patient    Disposition Plan: SNF early next week after culture finalized.    Procedures:  CT guided aspiration of t3-t4 paraspinal abscess 9/7  picc placement  Antibiotics:  Rocephin x1 on 9/7.  Vanc after spine aspiration of 9/7   Objective: BP 136/82 mmHg  Pulse 88  Temp(Src) 98.2 F (36.8 C) (Oral)  Resp 22  Ht 5\' 6"  (1.676 m)  Wt 148 lb (67.132 kg)  BMI 23.90 kg/m2  SpO2 97%  Intake/Output Summary (Last 24 hours) at 09/09/15 1843 Last data filed at 09/09/15 1723  Gross per 24 hour  Intake 1528.75 ml  Output   2700 ml  Net -1171.25 ml   Filed Weights   09/06/15 1024 09/06/15 1850  Weight: 48 lb (21.773 kg) 148 lb (67.132 kg)    Exam:   General:  NAD  Cardiovascular: RRR  Respiratory: CTABL  Abdomen: Soft/ND/NT, positive  BS  Musculoskeletal: back pain, tender to palpation, No lower extremity Edema  Neuro: aaox3, calm and cooperative  Data Reviewed: Basic Metabolic Panel:  Recent Labs Lab 09/05/15 2024 09/06/15 1224 09/07/15 0539 09/08/15 0827 09/09/15 0704  NA 137 136 134* 138 138  K 3.5 3.4* 3.5 3.6 3.6  CL 101 97* 97* 105 104  CO2 28 33* 28 27 27   GLUCOSE 67 81 64* 96 107*  BUN 6 <5* <5* <5* <5*  CREATININE 0.64 0.66 0.55 0.55 0.54  CALCIUM 8.4* 8.4* 8.1* 8.2* 8.3*  MG  --   --   --  1.7 1.5*   Liver Function Tests:  Recent Labs Lab 09/07/15 0539  AST 15  ALT 15  ALKPHOS 80  BILITOT 0.7  PROT 6.5  ALBUMIN 2.3*   No results for input(s): LIPASE, AMYLASE in the last 168 hours. No results for input(s): AMMONIA in the last 168 hours. CBC:  Recent Labs Lab 09/05/15 2024 09/06/15 1224 09/07/15 0539 09/08/15 0827 09/09/15 0704  WBC 9.0 9.4 7.1 4.9 5.7  NEUTROABS  --   --   --   --  3.1  HGB 11.9* 11.8* 11.5* 11.1* 11.4*  HCT 36.5 37.1 35.6* 33.0* 34.7*  MCV 89.0 88.3 87.3 85.9 86.3  PLT 271 337 284 212 223   Cardiac Enzymes:   No results for input(s): CKTOTAL, CKMB, CKMBINDEX, TROPONINI in the last 168 hours. BNP (last 3 results) No results for input(s): BNP in the last 8760 hours.  ProBNP (last  3 results) No results for input(s): PROBNP in the last 8760 hours.  CBG: No results for input(s): GLUCAP in the last 168 hours.  Recent Results (from the past 240 hour(s))  Blood culture (routine x 2)     Status: None   Collection Time: 09/06/15  4:39 PM  Result Value Ref Range Status   Specimen Description BLOOD LEFT ANTECUBITAL  Final   Special Requests BOTTLES DRAWN AEROBIC AND ANAEROBIC 5CC  Final   Culture  Setup Time   Final    GRAM POSITIVE COCCI IN CLUSTERS IN BOTH AEROBIC AND ANAEROBIC BOTTLES CRITICAL RESULT CALLED TO, READ BACK BY AND VERIFIED WITH: N ROBERTSON,RN AT 1215 09/07/15 BY L BENFIELD    Culture STAPHYLOCOCCUS SPECIES (COAGULASE NEGATIVE)  Final    Report Status 09/09/2015 FINAL  Final   Organism ID, Bacteria STAPHYLOCOCCUS SPECIES (COAGULASE NEGATIVE)  Final      Susceptibility   Staphylococcus species (coagulase negative) - MIC*    CIPROFLOXACIN <=0.5 SENSITIVE Sensitive     ERYTHROMYCIN 0.5 SENSITIVE Sensitive     GENTAMICIN <=0.5 SENSITIVE Sensitive     OXACILLIN <=0.25 SENSITIVE Sensitive     TETRACYCLINE 2 SENSITIVE Sensitive     VANCOMYCIN 1 SENSITIVE Sensitive     TRIMETH/SULFA <=10 SENSITIVE Sensitive     CLINDAMYCIN <=0.25 SENSITIVE Sensitive     RIFAMPIN <=0.5 SENSITIVE Sensitive     Inducible Clindamycin NEGATIVE Sensitive     * STAPHYLOCOCCUS SPECIES (COAGULASE NEGATIVE)  Blood culture (routine x 2)     Status: None (Preliminary result)   Collection Time: 09/06/15  8:30 PM  Result Value Ref Range Status   Specimen Description BLOOD RIGHT HAND  Final   Special Requests IN PEDIATRIC BOTTLE 1CC  Final   Culture NO GROWTH 3 DAYS  Final   Report Status PENDING  Incomplete  Culture, routine-abscess     Status: None (Preliminary result)   Collection Time: 09/07/15  2:41 PM  Result Value Ref Range Status   Specimen Description ABSCESS BACK  Final   Special Requests CT GUIDED ASPIRATION OF PARASPINOUS ABSCESS T3 T4  Final   Gram Stain   Final    ABUNDANT WBC PRESENT, PREDOMINANTLY PMN NO SQUAMOUS EPITHELIAL CELLS SEEN RARE GRAM POSITIVE COCCI IN PAIRS Performed at [facility]    Culture   Final    MODERATE STAPHYLOCOCCUS AUREUS Note: RIFAMPIN AND GENTAMICIN SHOULD NOT BE USED AS SINGLE DRUGS FOR TREATMENT OF STAPH INFECTIONS. Performed at [facility]    Report Status PENDING  Incomplete  Anaerobic culture     Status: None (Preliminary result)   Collection Time: 09/07/15  2:41 PM  Result Value Ref Range Status   Specimen Description ABSCESS BACK  Final   Special Requests CT GUIDED ASPIRATION OF APRASPINOUS ABSCESS T3 T4  Final   Gram Stain   Final    ABUNDANT WBC PRESENT, PREDOMINANTLY PMN NO  SQUAMOUS EPITHELIAL CELLS SEEN RARE GRAM POSITIVE COCCI IN PAIRS Performed at [facility]    Culture   Final    NO ANAEROBES ISOLATED; CULTURE IN PROGRESS FOR 5 DAYS Performed at [facility]    Report Status PENDING  Incomplete  Fungus culture w smear     Status: None (Preliminary result)   Collection Time: 09/07/15  2:41 PM  Result Value Ref Range Status   Specimen Description ABSCESS BACK  Final   Special Requests CT GUIDED ASPIRATION OF PARASPINOUS ABSCESS T3 T4  Final   Fungal Smear  Final    NO YEAST OR FUNGAL ELEMENTS SEEN Performed at [facility]    Culture   Final    CULTURE IN PROGRESS FOR FOUR WEEKS Performed at [facility]    Report Status PENDING  Incomplete  MRSA PCR Screening     Status: None   Collection Time: 09/08/15  6:28 PM  Result Value Ref Range Status   MRSA by PCR NEGATIVE NEGATIVE Final    Comment:        The GeneXpert MRSA Assay (FDA approved for NASAL specimens only), is one component of a comprehensive MRSA colonization surveillance program. It is not intended to diagnose MRSA infection nor to guide or monitor treatment for MRSA infections.      Studies: No results found.  Scheduled Meds: . ALPRAZolam  1 mg Oral QID  . gabapentin  100 mg Oral Q12H  . loratadine  10 mg Oral Daily  . mesalamine  1.2 g Oral BID  . metoprolol tartrate  12.5 mg Oral BID  . OLANZapine  10 mg Oral QHS  . traZODone  100 mg Oral BID  . vancomycin  750 mg Intravenous Q12H    Continuous Infusions: . dextrose 5 % and 0.9 % NaCl with KCl 20 mEq/L 75 mL/hr at 09/09/15 1815     Time spent:  Yarissa Reining MD, PhD  [facility] Pager [phone]. If 7PM-7AM, please contact night-coverage at [email or web address], password [facility] 09/09/2015, 6:43 PM  LOS: 3 days

## 2015-09-10 DIAGNOSIS — M869 Osteomyelitis, unspecified: Secondary | ICD-10-CM

## 2015-09-10 DIAGNOSIS — R768 Other specified abnormal immunological findings in serum: Secondary | ICD-10-CM | POA: Insufficient documentation

## 2015-09-10 DIAGNOSIS — F1990 Other psychoactive substance use, unspecified, uncomplicated: Secondary | ICD-10-CM

## 2015-09-10 DIAGNOSIS — M6008 Infective myositis, other site: Secondary | ICD-10-CM

## 2015-09-10 DIAGNOSIS — B9562 Methicillin resistant Staphylococcus aureus infection as the cause of diseases classified elsewhere: Secondary | ICD-10-CM

## 2015-09-10 DIAGNOSIS — M549 Dorsalgia, unspecified: Secondary | ICD-10-CM

## 2015-09-10 DIAGNOSIS — M542 Cervicalgia: Secondary | ICD-10-CM

## 2015-09-10 DIAGNOSIS — M4624 Osteomyelitis of vertebra, thoracic region: Secondary | ICD-10-CM

## 2015-09-10 LAB — BASIC METABOLIC PANEL
Anion gap: 7 (ref 5–15)
CALCIUM: 8.2 mg/dL — AB (ref 8.9–10.3)
CHLORIDE: 101 mmol/L (ref 101–111)
CO2: 26 mmol/L (ref 22–32)
CREATININE: 0.52 mg/dL (ref 0.44–1.00)
Glucose, Bld: 107 mg/dL — ABNORMAL HIGH (ref 65–99)
Potassium: 4.1 mmol/L (ref 3.5–5.1)
SODIUM: 134 mmol/L — AB (ref 135–145)

## 2015-09-10 LAB — CULTURE, ROUTINE-ABSCESS

## 2015-09-10 LAB — MAGNESIUM: MAGNESIUM: 1.8 mg/dL (ref 1.7–2.4)

## 2015-09-10 MED ORDER — OXYCODONE HCL 5 MG PO TABS
5.0000 mg | ORAL_TABLET | ORAL | Status: DC | PRN
  Administered 2015-09-12 (×2): 5 mg via ORAL
  Filled 2015-09-10 (×2): qty 1

## 2015-09-10 MED ORDER — HYDROMORPHONE HCL 1 MG/ML IJ SOLN
1.0000 mg | INTRAMUSCULAR | Status: DC | PRN
  Administered 2015-09-10 – 2015-09-12 (×6): 1 mg via INTRAVENOUS
  Filled 2015-09-10 (×6): qty 1

## 2015-09-10 NOTE — Progress Notes (Signed)
[facility]                                                                              Patient Demographics  Nicole Fitzgerald, is a 54 y.o. female, DOB - 1961/04/16, ZOX:[id number]  Admit date - 09/06/2015   Admitting Physician Albertine Grates, MD  Outpatient Primary MD for the patient is No primary care provider on file.  LOS - 4   Chief Complaint  Patient presents with  . Back Pain       Brief HPI  Per Dr Roda Shutters on 9/6 Nicole Fitzgerald is a 54 y.o. female  H/o IV drug user reported quit "cold [place] two months ago" , h/o recent hospitalization at [facility] long for community acquired pneumonia, presented to the ED with lower back pain. She also has a history of schizophrenia, Crohn's disease, Patient states that she was seen at [facility] yesterday for back pain. Patient had a CT of her chest done which found a mass on her T3-T4 vertebrae most consistent with a neoplastic process. MRI was recommended at that time however, MRI was not available at that hour at [facility]. Patient was recommended to follow up outpatient with an MRI however, patient states that her pain is unbearable and she needed to be seen sooner. Pain was 10 out of 10. Nonradiating. Denies bowel or bladder incontinence, saddle anesthesia, dysuria, weakness, numbness or tingling, chest pain, shortness of breath, fevers, chills.    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 Blood cultures so far negative - Discussed in detail with infectious disease, Dr. Ninetta Lights, recommended 6 weeks of IV vancomycin  - blood cultures 9/6, showed 1/2 coag negative staph. Repeat blood cultures 9/10 so far negative -  no new complaints of numbness, paresthesias or  weakness.  - PICC line placed on 9/9  Active Problems:   Chronic neck and back pain - Continue pain control     IVDU (intravenous drug user) -Patient strongly counseled on quitting drug use    Code Status:Full code  family Communication: Discussed in detail with the patient, all imaging results, lab results explained to the patient    Disposition Plan:Will need skilled nursing facility for IV antibiotics  Time Spent in minutes   25  minutes  Procedures  CT-guided aspiration of the abscess   Consults  Neurosurgery IR Infectious disease  DVT Prophylaxis  SCDs  Medications  Scheduled Meds: . ALPRAZolam  1 mg Oral QID  . gabapentin  100 mg Oral Q12H  . loratadine  10 mg Oral Daily  . mesalamine  1.2 g Oral BID  . metoprolol tartrate  12.5 mg Oral BID  . OLANZapine  10 mg Oral QHS  . traZODone  100 mg Oral BID  . vancomycin  750 mg Intravenous Q12H   Continuous Infusions: . dextrose 5 % and 0.9 % NaCl with KCl  20 mEq/L 75 mL/hr at 09/09/15 1815   PRN Meds:.albuterol, dicyclomine, HYDROmorphone (DILAUDID) injection, hydrOXYzine, nitroGLYCERIN, oxyCODONE, sodium chloride, traMADol   Antibiotics   Anti-infectives    Start     Dose/Rate Route Frequency Ordered Stop   09/08/15 0400  vancomycin (VANCOCIN) IVPB 750 mg/150 ml premix     750 mg 150 mL/hr over 60 Minutes Intravenous Every 12 hours 09/07/15 1417     09/07/15 1600  vancomycin (VANCOCIN) 1,500 mg in sodium chloride 0.9 % 500 mL IVPB     1,500 mg 250 mL/hr over 120 Minutes Intravenous  Once 09/07/15 1417 09/07/15 1847   09/07/15 1415  cefTRIAXone (ROCEPHIN) 2 g in dextrose 5 % 50 mL IVPB  Status:  Discontinued     2 g 100 mL/hr over 30 Minutes Intravenous Every 24 hours 09/07/15 1410 09/08/15 1113        Subjective:   Nicole Fitzgerald was seen and examined today.  Continues to complain of back pain, tearful, states Dilaudid works better. Afebrile. Patient denies dizziness, chest pain, shortness of breath,  abdominal pain, N/V/D/C. No new weakness, numbess, tingling. No acute events overnight.    Objective:   Blood pressure 153/89, pulse 94, temperature 99.7 F (37.6 C), temperature source Oral, resp. rate 16, height 5\' 6"  (1.676 m), weight 67.132 kg (148 lb), SpO2 95 %.  Wt Readings from Last 3 Encounters:  09/06/15 67.132 kg (148 lb)  08/11/15 71.305 kg (157 lb 3.2 oz)     Intake/Output Summary (Last 24 hours) at 09/10/15 1155 Last data filed at 09/10/15 1144  Gross per 24 hour  Intake    640 ml  Output   3600 ml  Net  -2960 ml    Exam  General: Alert and oriented x 3, NAD  HEENT:  PERRLA, EOMI, Anicteric Sclera, mucous membranes moist.   Neck: Supple, no JVD, no masses  CVS: S1 S2 auscultated, no rubs, murmurs or gallops. Regular rate and rhythm.  Respiratory: Clear to auscultation bilaterally, no wheezing, rales or rhonchi  Abdomen: Soft, nontender, nondistended, + bowel sounds  Ext: no cyanosis clubbing or edema  Neuro: AAOx3, Cr N's II- XII. Strength 5/5 upper and lower extremities bilaterally  Skin: No rashes  Psych: Normal affect and demeanor, alert and oriented x3    Data Review   Micro Results Recent Results (from the past 240 hour(s))  Blood culture (routine x 2)     Status: None   Collection Time: 09/06/15  4:39 PM  Result Value Ref Range Status   Specimen Description BLOOD LEFT ANTECUBITAL  Final   Special Requests BOTTLES DRAWN AEROBIC AND ANAEROBIC 5CC  Final   Culture  Setup Time   Final    GRAM POSITIVE COCCI IN CLUSTERS IN BOTH AEROBIC AND ANAEROBIC BOTTLES CRITICAL RESULT CALLED TO, READ BACK BY AND VERIFIED WITH: N ROBERTSON,RN AT 1215 09/07/15 BY L BENFIELD    Culture STAPHYLOCOCCUS SPECIES (COAGULASE NEGATIVE)  Final   Report Status 09/09/2015 FINAL  Final   Organism ID, Bacteria STAPHYLOCOCCUS SPECIES (COAGULASE NEGATIVE)  Final      Susceptibility   Staphylococcus species (coagulase negative) - MIC*    CIPROFLOXACIN <=0.5 SENSITIVE  Sensitive     ERYTHROMYCIN 0.5 SENSITIVE Sensitive     GENTAMICIN <=0.5 SENSITIVE Sensitive     OXACILLIN <=0.25 SENSITIVE Sensitive     TETRACYCLINE 2 SENSITIVE Sensitive     VANCOMYCIN 1 SENSITIVE Sensitive     TRIMETH/SULFA <=10 SENSITIVE Sensitive     CLINDAMYCIN <=0.25  SENSITIVE Sensitive     RIFAMPIN <=0.5 SENSITIVE Sensitive     Inducible Clindamycin NEGATIVE Sensitive     * STAPHYLOCOCCUS SPECIES (COAGULASE NEGATIVE)  Blood culture (routine x 2)     Status: None (Preliminary result)   Collection Time: 09/06/15  8:30 PM  Result Value Ref Range Status   Specimen Description BLOOD RIGHT HAND  Final   Special Requests IN PEDIATRIC BOTTLE 1CC  Final   Culture NO GROWTH 4 DAYS  Final   Report Status PENDING  Incomplete  Culture, routine-abscess     Status: None   Collection Time: 09/07/15  2:41 PM  Result Value Ref Range Status   Specimen Description ABSCESS BACK  Final   Special Requests CT GUIDED ASPIRATION OF PARASPINOUS ABSCESS T3 T4  Final   Gram Stain   Final    ABUNDANT WBC PRESENT, PREDOMINANTLY PMN NO SQUAMOUS EPITHELIAL CELLS SEEN RARE GRAM POSITIVE COCCI IN PAIRS Performed at [facility]    Culture   Final    MODERATE METHICILLIN RESISTANT STAPHYLOCOCCUS AUREUS Note: RIFAMPIN AND GENTAMICIN SHOULD NOT BE USED AS SINGLE DRUGS FOR TREATMENT OF STAPH INFECTIONS. This organism DOES NOT demonstrate inducible Clindamycin resistance in vitro. CRITICAL RESULT CALLED TO, READ BACK BY AND VERIFIED WITH: ROBIN Z 9/10  @1055  BY REAMM Performed at [facility]    Report Status 09/10/2015 FINAL  Final   Organism ID, Bacteria METHICILLIN RESISTANT STAPHYLOCOCCUS AUREUS  Final      Susceptibility   Methicillin resistant staphylococcus aureus - MIC*    CLINDAMYCIN <=0.25 SENSITIVE Sensitive     ERYTHROMYCIN >=8 RESISTANT Resistant     GENTAMICIN <=0.5 SENSITIVE Sensitive     LEVOFLOXACIN 4 INTERMEDIATE Intermediate     OXACILLIN >=4 RESISTANT Resistant      RIFAMPIN <=0.5 SENSITIVE Sensitive     TRIMETH/SULFA <=10 SENSITIVE Sensitive     VANCOMYCIN 1 SENSITIVE Sensitive     TETRACYCLINE <=1 SENSITIVE Sensitive     * MODERATE METHICILLIN RESISTANT STAPHYLOCOCCUS AUREUS  Anaerobic culture     Status: None (Preliminary result)   Collection Time: 09/07/15  2:41 PM  Result Value Ref Range Status   Specimen Description ABSCESS BACK  Final   Special Requests CT GUIDED ASPIRATION OF APRASPINOUS ABSCESS T3 T4  Final   Gram Stain   Final    ABUNDANT WBC PRESENT, PREDOMINANTLY PMN NO SQUAMOUS EPITHELIAL CELLS SEEN RARE GRAM POSITIVE COCCI IN PAIRS Performed at [facility]    Culture   Final    NO ANAEROBES ISOLATED; CULTURE IN PROGRESS FOR 5 DAYS Performed at [facility]    Report Status PENDING  Incomplete  Fungus culture w smear     Status: None (Preliminary result)   Collection Time: 09/07/15  2:41 PM  Result Value Ref Range Status   Specimen Description ABSCESS BACK  Final   Special Requests CT GUIDED ASPIRATION OF PARASPINOUS ABSCESS T3 T4  Final   Fungal Smear   Final    NO YEAST OR FUNGAL ELEMENTS SEEN Performed at [facility]    Culture   Final    CULTURE IN PROGRESS FOR FOUR WEEKS Performed at [facility]    Report Status PENDING  Incomplete  MRSA PCR Screening     Status: None   Collection Time: 09/08/15  6:28 PM  Result Value Ref Range Status   MRSA by PCR NEGATIVE NEGATIVE Final    Comment:        The GeneXpert MRSA  Assay (FDA approved for NASAL specimens only), is one component of a comprehensive MRSA colonization surveillance program. It is not intended to diagnose MRSA infection nor to guide or monitor treatment for MRSA infections.     Radiology Reports Dg Chest 2 View  09/05/2015   CLINICAL DATA:  54 year old female with recurrent pneumonia like symptoms  EXAM: CHEST  2 VIEW  COMPARISON:  Prior chest x-ray 08/12/2015  FINDINGS: Developing linear opacities in the right lower  lobe most consistent with areas of atelectasis or scarring. No focal airspace consolidation. Cardiac and mediastinal contours remain within normal limits. No pleural effusion or pneumothorax. No suspicious nodule or mass. No acute osseous abnormality.  IMPRESSION: Linear atelectasis versus scarring in the right lower lobe.   Electronically Signed   By: Malachy Moan M.D.   On: 09/05/2015 14:51   X-ray Chest Pa And Lateral  08/12/2015   CLINICAL DATA:  Persistent cough, congestion, shortness breath, and chest pain  EXAM: CHEST  2 VIEW  COMPARISON:  Chest x-ray of August 11, 2015  FINDINGS: The lungs are better inflated today. Persistent lingular infiltrate is noted. Slightly increased density in the right infrahilar region is more conspicuous today. The heart and pulmonary vascularity are normal. The mediastinum is normal in width. There is no pleural effusion. The bony thorax is unremarkable.  IMPRESSION: Persistent lingular pneumonia. There may be early atelectasis or pneumonia in the right middle lobe medially. There is no pleural effusion or pneumothorax. Continued follow-up chest x-rays are recommended unless the patient's symptoms markedly. If such improvement is seen clinically, a 1 month follow-up chest x-ray would be useful to assure complete clearing.   Electronically Signed   By: David  [place] M.D.   On: 08/12/2015 09:36   Ct Angio Chest Pe W/cm &/or Wo Cm  09/05/2015   CLINICAL DATA:  54 year old female with pain across the upper chest and shortness of breath  EXAM: CT ANGIOGRAPHY CHEST WITH CONTRAST  TECHNIQUE: Multidetector CT imaging of the chest was performed using the standard protocol during bolus administration of intravenous contrast. Multiplanar CT image reconstructions and MIPs were obtained to evaluate the vascular anatomy.  CONTRAST:  OMNIPAQUE IOHEXOL 350 MG/ML SOLN  COMPARISON:  Chest radiograph dated 09/05/2015  FINDINGS: Evaluation of this exam is limited due to respiratory  motion artifact.  There is linea atelectasis/scarring in the lingula. There is partial consolidation of the right middle lobe possibly atelectasis versus pneumonia. Right lower lobe atelectatic changes noted. There is no pleural effusion or pneumothorax. The central airways are patent.  The visualized thoracic aorta appears unremarkable. No CT evidence of pulmonary embolism. There is no cardiomegaly or pericardial effusion. There is no hilar adenopathy. Top-normal upper mediastinal lymph nodes noted. The thyroid gland appears unremarkable.  There is a bilobed appearing paraspinal soft tissue mass at T3-T4 most compatible with a neoplasm. The right lobe of the mass measures approximately 3.3 x 2.0 cm and the left lobe of the lesion measures approximately 2.9 x 1.2 cm. There is associated destructive changes and fracture of the inferior endplate of the T3 and superior endplate of T4 compatible with pathologic fracture. Differential includes a neurogenic tumor, non neurogenic tumors such as chondroid or osseous neoplasm, lymphoma. Other infectious process is not excluded. Clinical correlation is recommended. MRI without and with contrast is recommended for further characterization.  The visualized upper abdomen is unremarkable.  Review of the MIP images confirms the above findings.  IMPRESSION: No CT evidence of pulmonary embolism.  Bilobed  paraspinal soft tissue mass centered at T3-T4 with mild destructive changes and pathologic fracture of the adjacent vertebra most compatible with neoplastic process. MRI is recommended for further characterization.   Electronically Signed   By: Elgie Collard M.D.   On: 09/05/2015 23:44   Mr Thoracic Spine W Wo Contrast  09/06/2015   CLINICAL DATA:  Two week history of cough and shortness of breath. Stabbing pain in the thoracic spinal region with inspiration and cough. Abnormal CT at T3-4  EXAM: MRI THORACIC SPINE WITHOUT AND WITH CONTRAST  TECHNIQUE: Multiplanar and multiecho  pulse sequences of the thoracic spine were obtained without and with intravenous contrast.  CONTRAST:  13mL MULTIHANCE GADOBENATE DIMEGLUMINE 529 MG/ML IV SOLN  COMPARISON:  CT chest 09/05/2015  FINDINGS: Scout views in the cervical regions show degenerative spondylosis most pronounced at T5-6 and T6-7.  In the thoracic region, there is no abnormal process centered at the T3-4 disc space with involvement of the T3 and T4 vertebral bodies and paravertebral soft tissues. The findings are most consistent with discitis osteomyelitis at this level. There is thickening and enhancement of the epidural tissues, resulting in constriction of the thecal sac and slight displacement and deformity of the cord. No definite abnormal cord signal. One could question early abnormal T2 signal. Paravertebral material is largely enhancing, though there is some nonenhancing fluid on the right consistent with abscess.  No involvement elsewhere in the thoracic region is identified. There is degenerative disc disease at T11-12 and T12-L1.  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 Paravertebral phlegmonous inflammation with early abscess formation on the right. Epidural phlegmonous inflammation with constriction of the thecal sac and slight deformity and displacement of the cord. Question early abnormal T2 signal in the cord. The possibility of tumor is considered but felt less likely.  Critical Value/emergent results were called by telephone at the time of interpretation on 09/06/2015 at 3:22 pm to [facility] , who verbally acknowledged these results.   Electronically Signed   By: Paulina Fusi M.D.   On: 09/06/2015 15:27   Ct Aspiration  09/07/2015   CLINICAL DATA:  Evidence of thoracic discitis and osteomyelitis at the T3-4 level by prior MRI and CT with abnormal paraspinous fluid/phlegmon present. Aspiration of the paraspinous soft tissues  has been requested for diagnostic purposes of determining infectious organism.  EXAM: CT GUIDED ASPIRATION OF THORACIC PARASPINAL SOFT TISSUES  ANESTHESIA/SEDATION: 2.0  Mg IV Versed; 100 mcg IV Fentanyl  Total Moderate Sedation Time: 30 minutes.  PROCEDURE: The procedure risks, benefits, and alternatives were explained to the patient. Questions regarding the procedure were encouraged and answered. The patient understands and consents to the procedure.  The right posterior upper thoracic paraspinal region was prepped with Betadine in a sterile fashion, and a sterile drape was applied covering the operative field. A sterile gown and sterile gloves were used for the procedure. Local anesthesia was provided with 1% Lidocaine.  CT was performed in a prone position. Under CT guidance, an 18 gauge trocar needle was advanced into the right-sided paraspinous soft tissues at the T3-4 level. Aspiration was performed. Aspirated fluid sample was sent for culture studies including a aerobic and anaerobic cultures and fungal culture.  COMPLICATIONS: None  FINDINGS: Aspiration of fluid in the right paraspinal region just opposite the inferior endplate of T3 yielded grossly purulent fluid. Approximately 2 mL of fluid was able to be aspirated.  IMPRESSION: CT-guided aspiration at the level of right paraspinous soft tissues at the T3-4 level yielding grossly purulent fluid. 2 mL of fluid was able to be aspirated and sent for culture analysis.   Electronically Signed   By: Irish Lack M.D.   On: 09/07/2015 17:33    CBC  Recent Labs Lab 09/05/15 2024 09/06/15 1224 09/07/15 0539 09/08/15 0827 09/09/15 0704  WBC 9.0 9.4 7.1 4.9 5.7  HGB 11.9* 11.8* 11.5* 11.1* 11.4*  HCT 36.5 37.1 35.6* 33.0* 34.7*  PLT 271 337 284 212 223  MCV 89.0 88.3 87.3 85.9 86.3  MCH 29.0 28.1 28.2 28.9 28.4  MCHC 32.6 31.8 32.3 33.6 32.9  RDW 15.7* 15.7* 15.6* 15.3 15.0  LYMPHSABS  --   --   --   --  1.9  MONOABS  --   --   --   --  0.7    EOSABS  --   --   --   --  0.1  BASOSABS  --   --   --   --  0.0    Chemistries   Recent Labs Lab 09/06/15 1224 09/07/15 0539 09/08/15 0827 09/09/15 0704 09/10/15 0115  NA 136 134* 138 138 134*  K 3.4* 3.5 3.6 3.6 4.1  CL 97* 97* 105 104 101  CO2 33* 28 27 27 26   GLUCOSE 81 64* 96 107* 107*  BUN <5* <5* <5* <5* <5*  CREATININE 0.66 0.55 0.55 0.54 0.52  CALCIUM 8.4* 8.1* 8.2* 8.3* 8.2*  MG  --   --  1.7 1.5* 1.8  AST  --  15  --   --   --   ALT  --  15  --   --   --   ALKPHOS  --  80  --   --   --   BILITOT  --  0.7  --   --   --    ------------------------------------------------------------------------------------------------------------------ estimated creatinine clearance is 73.5 mL/min (by C-G formula based on Cr of 0.52). ------------------------------------------------------------------------------------------------------------------ No results for input(s): HGBA1C in the last 72 hours. ------------------------------------------------------------------------------------------------------------------ No results for input(s): CHOL, HDL, LDLCALC, TRIG, CHOLHDL, LDLDIRECT in the last 72 hours. ------------------------------------------------------------------------------------------------------------------ No results for input(s): TSH, T4TOTAL, T3FREE, THYROIDAB in the last 72 hours.  Invalid input(s): FREET3 ------------------------------------------------------------------------------------------------------------------ No results for input(s): VITAMINB12, FOLATE, FERRITIN, TIBC, IRON, RETICCTPCT in the last 72 hours.  Coagulation profile  Recent Labs Lab 09/07/15 0539  INR 1.15    No results for input(s): DDIMER in the last 72 hours.  Cardiac Enzymes No results for input(s): CKMB, TROPONINI, MYOGLOBIN in the last 168 hours.  Invalid input(s):  CK ------------------------------------------------------------------------------------------------------------------ Invalid input(s): POCBNP  No results for input(s): GLUCAP in the last 72 hours.   RAI,RIPUDEEP M.D. [facility] 09/10/2015, 11:55 AM  Pager: [phone] Between 7am to 7pm - call Pager - [phone]  After 7pm go to [email or web address] - password [email or web address]  Call night coverage person covering after 7pm

## 2015-09-10 NOTE — Progress Notes (Addendum)
Day shift nurse stated that the day time nurse tech heard patient expressing thoughts of hurting herself.  Went to reassess pt and pt. Stated she did not want to hurt herself.  She was just frustrated with daytime staff. Stated  That she was "tired of feeling sick and not getting treated well by daytime staff."  Pt. Is not determined for suicide risk based on this assessment. Will continue to monitor.

## 2015-09-10 NOTE — Progress Notes (Signed)
INFECTIOUS DISEASE PROGRESS NOTE  ID: Nicole Fitzgerald is a 54 y.o. female with  Principal Problem:   Paraspinal abscess Active Problems:   Chronic neck and back pain   Osteomyelitis   IVDU (intravenous drug user)  Subjective: C/o back pain  Abtx:  Anti-infectives    Start     Dose/Rate Route Frequency Ordered Stop   09/08/15 0400  vancomycin (VANCOCIN) IVPB 750 mg/150 ml premix     750 mg 150 mL/hr over 60 Minutes Intravenous Every 12 hours 09/07/15 1417     09/07/15 1600  vancomycin (VANCOCIN) 1,500 mg in sodium chloride 0.9 % 500 mL IVPB     1,500 mg 250 mL/hr over 120 Minutes Intravenous  Once 09/07/15 1417 09/07/15 1847   09/07/15 1415  cefTRIAXone (ROCEPHIN) 2 g in dextrose 5 % 50 mL IVPB  Status:  Discontinued     2 g 100 mL/hr over 30 Minutes Intravenous Every 24 hours 09/07/15 1410 09/08/15 1113      Medications:  Scheduled: . ALPRAZolam  1 mg Oral QID  . gabapentin  100 mg Oral Q12H  . loratadine  10 mg Oral Daily  . mesalamine  1.2 g Oral BID  . metoprolol tartrate  12.5 mg Oral BID  . OLANZapine  10 mg Oral QHS  . traZODone  100 mg Oral BID  . vancomycin  750 mg Intravenous Q12H    Objective: Vital signs in last 24 hours: Temp:  [99.4 F (37.4 C)-99.7 F (37.6 C)] 99.7 F (37.6 C) (09/10 0610) Pulse Rate:  [92-94] 94 (09/10 0610) Resp:  [12-22] 16 (09/10 0610) BP: (153-156)/(89-99) 153/89 mmHg (09/10 0610) SpO2:  [95 %-97 %] 95 % (09/10 0610)   General appearance: alert and moderate distress Resp: clear to auscultation bilaterally Cardio: regular rate and rhythm GI: normal findings: bowel sounds normal and soft, non-tender  RUE [facility]  Lab Results  Recent Labs  09/08/15 0827 09/09/15 0704 09/10/15 0115  WBC 4.9 5.7  --   HGB 11.1* 11.4*  --   HCT 33.0* 34.7*  --   NA 138 138 134*  K 3.6 3.6 4.1  CL 105 104 101  CO2 '27 27 26  ' BUN <5* <5* <5*  CREATININE 0.55 0.54 0.52   Liver Panel No results for input(s): PROT, ALBUMIN, AST, ALT,  ALKPHOS, BILITOT, BILIDIR, IBILI in the last 72 hours. Sedimentation Rate  Recent Labs  09/08/15 0827  ESRSEDRATE 83*   C-Reactive Protein  Recent Labs  09/08/15 0827  CRP 6.8*    Microbiology: Recent Results (from the past 240 hour(s))  Blood culture (routine x 2)     Status: None   Collection Time: 09/06/15  4:39 PM  Result Value Ref Range Status   Specimen Description BLOOD LEFT ANTECUBITAL  Final   Special Requests BOTTLES DRAWN AEROBIC AND ANAEROBIC 5CC  Final   Culture  Setup Time   Final    GRAM POSITIVE COCCI IN CLUSTERS IN BOTH AEROBIC AND ANAEROBIC BOTTLES CRITICAL RESULT CALLED TO, READ BACK BY AND VERIFIED WITH: N ROBERTSON,RN AT 1215 09/07/15 BY L BENFIELD    Culture STAPHYLOCOCCUS SPECIES (COAGULASE NEGATIVE)  Final   Report Status 09/09/2015 FINAL  Final   Organism ID, Bacteria STAPHYLOCOCCUS SPECIES (COAGULASE NEGATIVE)  Final      Susceptibility   Staphylococcus species (coagulase negative) - MIC*    CIPROFLOXACIN <=0.5 SENSITIVE Sensitive     ERYTHROMYCIN 0.5 SENSITIVE Sensitive     GENTAMICIN <=0.5 SENSITIVE Sensitive  OXACILLIN <=0.25 SENSITIVE Sensitive     TETRACYCLINE 2 SENSITIVE Sensitive     VANCOMYCIN 1 SENSITIVE Sensitive     TRIMETH/SULFA <=10 SENSITIVE Sensitive     CLINDAMYCIN <=0.25 SENSITIVE Sensitive     RIFAMPIN <=0.5 SENSITIVE Sensitive     Inducible Clindamycin NEGATIVE Sensitive     * STAPHYLOCOCCUS SPECIES (COAGULASE NEGATIVE)  Blood culture (routine x 2)     Status: None (Preliminary result)   Collection Time: 09/06/15  8:30 PM  Result Value Ref Range Status   Specimen Description BLOOD RIGHT HAND  Final   Special Requests IN PEDIATRIC BOTTLE 1CC  Final   Culture NO GROWTH 4 DAYS  Final   Report Status PENDING  Incomplete  Culture, routine-abscess     Status: None   Collection Time: 09/07/15  2:41 PM  Result Value Ref Range Status   Specimen Description ABSCESS BACK  Final   Special Requests CT GUIDED ASPIRATION OF  PARASPINOUS ABSCESS T3 T4  Final   Gram Stain   Final    ABUNDANT WBC PRESENT, PREDOMINANTLY PMN NO SQUAMOUS EPITHELIAL CELLS SEEN RARE GRAM POSITIVE COCCI IN PAIRS Performed at [facility]    Culture   Final    MODERATE METHICILLIN RESISTANT STAPHYLOCOCCUS AUREUS Note: RIFAMPIN AND GENTAMICIN SHOULD NOT BE USED AS SINGLE DRUGS FOR TREATMENT OF STAPH INFECTIONS. This organism DOES NOT demonstrate inducible Clindamycin resistance in vitro. CRITICAL RESULT CALLED TO, READ BACK BY AND VERIFIED WITH: ROBIN Z 9/10  '@1055'  BY REAMM Performed at [facility]    Report Status 09/10/2015 FINAL  Final   Organism ID, Bacteria METHICILLIN RESISTANT STAPHYLOCOCCUS AUREUS  Final      Susceptibility   Methicillin resistant staphylococcus aureus - MIC*    CLINDAMYCIN <=0.25 SENSITIVE Sensitive     ERYTHROMYCIN >=8 RESISTANT Resistant     GENTAMICIN <=0.5 SENSITIVE Sensitive     LEVOFLOXACIN 4 INTERMEDIATE Intermediate     OXACILLIN >=4 RESISTANT Resistant     RIFAMPIN <=0.5 SENSITIVE Sensitive     TRIMETH/SULFA <=10 SENSITIVE Sensitive     VANCOMYCIN 1 SENSITIVE Sensitive     TETRACYCLINE <=1 SENSITIVE Sensitive     * MODERATE METHICILLIN RESISTANT STAPHYLOCOCCUS AUREUS  Anaerobic culture     Status: None (Preliminary result)   Collection Time: 09/07/15  2:41 PM  Result Value Ref Range Status   Specimen Description ABSCESS BACK  Final   Special Requests CT GUIDED ASPIRATION OF APRASPINOUS ABSCESS T3 T4  Final   Gram Stain   Final    ABUNDANT WBC PRESENT, PREDOMINANTLY PMN NO SQUAMOUS EPITHELIAL CELLS SEEN RARE GRAM POSITIVE COCCI IN PAIRS Performed at [facility]    Culture   Final    NO ANAEROBES ISOLATED; CULTURE IN PROGRESS FOR 5 DAYS Performed at [facility]    Report Status PENDING  Incomplete  Fungus culture w smear     Status: None (Preliminary result)   Collection Time: 09/07/15  2:41 PM  Result Value Ref Range Status   Specimen Description  ABSCESS BACK  Final   Special Requests CT GUIDED ASPIRATION OF PARASPINOUS ABSCESS T3 T4  Final   Fungal Smear   Final    NO YEAST OR FUNGAL ELEMENTS SEEN Performed at [facility]    Culture   Final    CULTURE IN PROGRESS FOR FOUR WEEKS Performed at [facility]    Report Status PENDING  Incomplete  MRSA PCR Screening     Status: None  Collection Time: 09/08/15  6:28 PM  Result Value Ref Range Status   MRSA by PCR NEGATIVE NEGATIVE Final    Comment:        The GeneXpert MRSA Assay (FDA approved for NASAL specimens only), is one component of a comprehensive MRSA colonization surveillance program. It is not intended to diagnose MRSA infection nor to guide or monitor treatment for MRSA infections.     Studies/Results: No results found.   Assessment/Plan: T3-4 Paraspinal abscess Crohn's Disease 1/2 BCx+ CNS CP Hep C   Total days of antibiotics: 3 vanco  Would plan for 6 weeks of vanco Home with PIC Check Hep C genotype, VL Check Hep A/B serologies.   F/u in ID clinic in 1 month  Diagnosis: Myositis and Osteomyelltis  Culture Result: MRSA  Allergies  Allergen Reactions  . Aspirin Other (See Comments)    MD told not to take due to crohns    Discharge antibiotics: Vancomycin 783m q12h Duration: 42 days End Date: October 19, 2015  PTwispPer Protocol: Labs weekly while on IV antibiotics: _X_ CBC with differential _X_ CMP _X_ CRP _X_ ESR _X_ Vancomycin trough  Fax weekly labs to [phone] Clinic Follow Up Appt: 6 weeks, [facility] (pager) [phone] 09/10/2015, 1:59 PM  LOS: 4 days

## 2015-09-11 DIAGNOSIS — R894 Abnormal immunological findings in specimens from other organs, systems and tissues: Secondary | ICD-10-CM

## 2015-09-11 LAB — HEPATITIS A ANTIBODY, TOTAL: Hep A Total Ab: NEGATIVE

## 2015-09-11 LAB — HEPATITIS B CORE ANTIBODY, TOTAL: Hep B Core Total Ab: NEGATIVE

## 2015-09-11 LAB — HEPATITIS B SURFACE ANTIBODY, QUANTITATIVE: Hepatitis B-Post: <3.1 m[IU]/mL — ABNORMAL LOW (ref >9.9)

## 2015-09-11 LAB — CULTURE, BLOOD (ROUTINE X 2): Culture: NO GROWTH

## 2015-09-11 LAB — VANCOMYCIN, TROUGH: Vancomycin Tr: 10 ug/mL (ref 10.0–20.0)

## 2015-09-11 MED ORDER — SODIUM CHLORIDE 0.9 % IV BOLUS (SEPSIS)
500.0000 mL | Freq: Once | INTRAVENOUS | Status: AC
  Administered 2015-09-11: 500 mL via INTRAVENOUS

## 2015-09-11 MED ORDER — METHOCARBAMOL 500 MG PO TABS
500.0000 mg | ORAL_TABLET | Freq: Three times a day (TID) | ORAL | Status: DC
  Administered 2015-09-11 – 2015-09-12 (×4): 500 mg via ORAL
  Filled 2015-09-11 (×4): qty 1

## 2015-09-11 MED ORDER — MORPHINE SULFATE ER 30 MG PO TBCR
30.0000 mg | EXTENDED_RELEASE_TABLET | Freq: Two times a day (BID) | ORAL | Status: DC
  Administered 2015-09-11 – 2015-09-12 (×2): 30 mg via ORAL
  Filled 2015-09-11 (×2): qty 1

## 2015-09-11 MED ORDER — WHITE PETROLATUM GEL
Status: AC
  Administered 2015-09-11: 13:00:00
  Filled 2015-09-11: qty 1

## 2015-09-11 MED ORDER — OXYCODONE HCL ER 15 MG PO T12A: 15.0000 mg | EXTENDED_RELEASE_TABLET | Freq: Two times a day (BID) | ORAL | Status: DC

## 2015-09-11 MED ORDER — METHOCARBAMOL 500 MG PO TABS: 500.0000 mg | ORAL_TABLET | Freq: Four times a day (QID) | ORAL | Status: DC

## 2015-09-11 MED ORDER — VANCOMYCIN HCL IN DEXTROSE 1-5 GM/200ML-% IV SOLN
1000.0000 mg | Freq: Two times a day (BID) | INTRAVENOUS | Status: DC
  Administered 2015-09-11 – 2015-09-12 (×2): 1000 mg via INTRAVENOUS
  Filled 2015-09-11 (×4): qty 200

## 2015-09-11 MED ORDER — KETOROLAC TROMETHAMINE 15 MG/ML IJ SOLN
15.0000 mg | Freq: Four times a day (QID) | INTRAMUSCULAR | Status: AC
  Administered 2015-09-11 – 2015-09-12 (×4): 15 mg via INTRAVENOUS
  Filled 2015-09-11 (×3): qty 1

## 2015-09-11 MED ORDER — TRAMADOL HCL 50 MG PO TABS: 50.0000 mg | ORAL_TABLET | Freq: Four times a day (QID) | ORAL | Status: DC | PRN

## 2015-09-11 NOTE — Progress Notes (Signed)
ANTIBIOTIC CONSULT NOTE - FOLLOW UP  Pharmacy Consult for Vancomycin Indication: spinal abscess  Allergies  Allergen Reactions  . Aspirin Other (See Comments)    MD told not to take due to crohns    Patient Measurements: Height: 5\' 6"  (167.6 cm) Weight: 151 lb 14.4 oz (68.9 kg) IBW/kg (Calculated) : 59.3  Vital Signs: Temp: 98.3 F (36.8 C) (09/11 1336) Temp Source: Axillary (09/11 1336) BP: 96/70 mmHg (09/11 1336) Pulse Rate: 89 (09/11 1336) Intake/Output from previous day: 09/10 0701 - 09/11 0700 In: 4262.5 [P.O.:500; I.V.:3462.5; IV Piggyback:300] Out: 3100 [Urine:3100] Intake/Output from this shift: Total I/O In: 435 [P.O.:240; I.V.:195] Out: -   Labs:  Recent Labs  09/09/15 0704 09/10/15 0115  WBC 5.7  --   HGB 11.4*  --   PLT 223  --   CREATININE 0.54 0.52   Estimated Creatinine Clearance: 73.5 mL/min (by C-G formula based on Cr of 0.52).  Recent Labs  09/11/15 1723  VANCOTROUGH 10     Microbiology: Recent Results (from the past 720 hour(s))  Blood culture (routine x 2)     Status: None   Collection Time: 09/06/15  4:39 PM  Result Value Ref Range Status   Specimen Description BLOOD LEFT ANTECUBITAL  Final   Special Requests BOTTLES DRAWN AEROBIC AND ANAEROBIC 5CC  Final   Culture  Setup Time   Final    GRAM POSITIVE COCCI IN CLUSTERS IN BOTH AEROBIC AND ANAEROBIC BOTTLES CRITICAL RESULT CALLED TO, READ BACK BY AND VERIFIED WITH: N ROBERTSON,RN AT 1215 09/07/15 BY L BENFIELD    Culture STAPHYLOCOCCUS SPECIES (COAGULASE NEGATIVE)  Final   Report Status 09/09/2015 FINAL  Final   Organism ID, Bacteria STAPHYLOCOCCUS SPECIES (COAGULASE NEGATIVE)  Final      Susceptibility   Staphylococcus species (coagulase negative) - MIC*    CIPROFLOXACIN <=0.5 SENSITIVE Sensitive     ERYTHROMYCIN 0.5 SENSITIVE Sensitive     GENTAMICIN <=0.5 SENSITIVE Sensitive     OXACILLIN <=0.25 SENSITIVE Sensitive     TETRACYCLINE 2 SENSITIVE Sensitive     VANCOMYCIN 1  SENSITIVE Sensitive     TRIMETH/SULFA <=10 SENSITIVE Sensitive     CLINDAMYCIN <=0.25 SENSITIVE Sensitive     RIFAMPIN <=0.5 SENSITIVE Sensitive     Inducible Clindamycin NEGATIVE Sensitive     * STAPHYLOCOCCUS SPECIES (COAGULASE NEGATIVE)  Blood culture (routine x 2)     Status: None   Collection Time: 09/06/15  8:30 PM  Result Value Ref Range Status   Specimen Description BLOOD RIGHT HAND  Final   Special Requests IN PEDIATRIC BOTTLE 1CC  Final   Culture NO GROWTH 5 DAYS  Final   Report Status 09/11/2015 FINAL  Final  Culture, routine-abscess     Status: None   Collection Time: 09/07/15  2:41 PM  Result Value Ref Range Status   Specimen Description ABSCESS BACK  Final   Special Requests CT GUIDED ASPIRATION OF PARASPINOUS ABSCESS T3 T4  Final   Gram Stain   Final    ABUNDANT WBC PRESENT, PREDOMINANTLY PMN NO SQUAMOUS EPITHELIAL CELLS SEEN RARE GRAM POSITIVE COCCI IN PAIRS Performed at [facility]    Culture   Final    MODERATE METHICILLIN RESISTANT STAPHYLOCOCCUS AUREUS Note: RIFAMPIN AND GENTAMICIN SHOULD NOT BE USED AS SINGLE DRUGS FOR TREATMENT OF STAPH INFECTIONS. This organism DOES NOT demonstrate inducible Clindamycin resistance in vitro. CRITICAL RESULT CALLED TO, READ BACK BY AND VERIFIED WITH: ROBIN Z 9/10  @1055  BY REAMM Performed at [facility]    Report Status 09/10/2015 FINAL  Final   Organism ID, Bacteria METHICILLIN RESISTANT STAPHYLOCOCCUS AUREUS  Final      Susceptibility   Methicillin resistant staphylococcus aureus - MIC*    CLINDAMYCIN <=0.25 SENSITIVE Sensitive     ERYTHROMYCIN >=8 RESISTANT Resistant     GENTAMICIN <=0.5 SENSITIVE Sensitive     LEVOFLOXACIN 4 INTERMEDIATE Intermediate     OXACILLIN >=4 RESISTANT Resistant     RIFAMPIN <=0.5 SENSITIVE Sensitive     TRIMETH/SULFA <=10 SENSITIVE Sensitive     VANCOMYCIN 1 SENSITIVE Sensitive     TETRACYCLINE <=1 SENSITIVE Sensitive     * MODERATE METHICILLIN RESISTANT STAPHYLOCOCCUS  AUREUS  Anaerobic culture     Status: None (Preliminary result)   Collection Time: 09/07/15  2:41 PM  Result Value Ref Range Status   Specimen Description ABSCESS BACK  Final   Special Requests CT GUIDED ASPIRATION OF APRASPINOUS ABSCESS T3 T4  Final   Gram Stain   Final    ABUNDANT WBC PRESENT, PREDOMINANTLY PMN NO SQUAMOUS EPITHELIAL CELLS SEEN RARE GRAM POSITIVE COCCI IN PAIRS Performed at [facility]    Culture   Final    NO ANAEROBES ISOLATED; CULTURE IN PROGRESS FOR 5 DAYS Performed at [facility]    Report Status PENDING  Incomplete  Fungus culture w smear     Status: None (Preliminary result)   Collection Time: 09/07/15  2:41 PM  Result Value Ref Range Status   Specimen Description ABSCESS BACK  Final   Special Requests CT GUIDED ASPIRATION OF PARASPINOUS ABSCESS T3 T4  Final   Fungal Smear   Final    NO YEAST OR FUNGAL ELEMENTS SEEN Performed at [facility]    Culture   Final    CULTURE IN PROGRESS FOR FOUR WEEKS Performed at [facility]    Report Status PENDING  Incomplete  MRSA PCR Screening     Status: None   Collection Time: 09/08/15  6:28 PM  Result Value Ref Range Status   MRSA by PCR NEGATIVE NEGATIVE Final    Comment:        The GeneXpert MRSA Assay (FDA approved for NASAL specimens only), is one component of a comprehensive MRSA colonization surveillance program. It is not intended to diagnose MRSA infection nor to guide or monitor treatment for MRSA infections.   Culture, blood (routine x 2)     Status: None (Preliminary result)   Collection Time: 09/10/15  1:00 AM  Result Value Ref Range Status   Specimen Description BLOOD LEFT HAND  Final   Special Requests IN PEDIATRIC BOTTLE 3CC  Final   Culture NO GROWTH 1 DAY  Final   Report Status PENDING  Incomplete  Culture, blood (routine x 2)     Status: None (Preliminary result)   Collection Time: 09/10/15  1:15 AM  Result Value Ref Range Status   Specimen  Description BLOOD LEFT HAND  Final   Special Requests IN PEDIATRIC BOTTLE 3CC  Final   Culture NO GROWTH 1 DAY  Final   Report Status PENDING  Incomplete    Anti-infectives    Start     Dose/Rate Route Frequency Ordered Stop   09/08/15 0400  vancomycin (VANCOCIN) IVPB 750 mg/150 ml premix     750 mg 150 mL/hr over 60 Minutes Intravenous Every 12 hours 09/07/15 1417     09/07/15 1600  vancomycin (VANCOCIN) 1,500 mg in sodium chloride 0.9 % 500 mL IVPB  1,500 mg 250 mL/hr over 120 Minutes Intravenous  Once 09/07/15 1417 09/07/15 1847   09/07/15 1415  cefTRIAXone (ROCEPHIN) 2 g in dextrose 5 % 50 mL IVPB  Status:  Discontinued     2 g 100 mL/hr over 30 Minutes Intravenous Every 24 hours 09/07/15 1410 09/08/15 1113      Assessment: 54yof continues on day #5 vancomycin for MRSA spinal abscess/osteomyelitis. Vancomycin trough this evening is below goal at 10. Renal function stable. ID planning 6 weeks of therapy.  Goal of Therapy:  Vancomycin trough level 15-20 mcg/ml  Plan:  1) Increase vancomycin 1g IV q12 (instead of q8 for convenience since she will be discharging on it) 2) Check trough at new steady state  Fredrik Rigger 09/11/2015,6:13 PM

## 2015-09-11 NOTE — Progress Notes (Signed)
[facility]                                                                              Patient Demographics  Nicole Fitzgerald, is a 54 y.o. female, DOB - 03/06/1961, JXB:[id number]  Admit date - 09/06/2015   Admitting Physician Albertine Grates, MD  Outpatient Primary MD for the patient is No primary care Lisvet Rasheed on file.  LOS - 5   Chief Complaint  Patient presents with  . Back Pain       Brief HPI  Per Dr Roda Shutters on 9/6 Nicole Fitzgerald is a 54 y.o. female  H/o IV drug user reported quit "cold [place] two months ago" , h/o recent hospitalization at [facility] long for community acquired pneumonia, presented to the ED with lower back pain. She also has a history of schizophrenia, Crohn's disease, Patient states that she was seen at [facility] yesterday for back pain. Patient had a CT of her chest done which found a mass on her T3-T4 vertebrae most consistent with a neoplastic process. MRI was recommended at that time however, MRI was not available at that hour at [facility]. Patient was recommended to follow up outpatient with an MRI however, patient states that her pain is unbearable and she needed to be seen sooner. Pain was 10 out of 10. Nonradiating. Denies bowel or bladder incontinence, saddle anesthesia, dysuria, weakness, numbness or tingling, chest pain, shortness of breath, fevers, chills.    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 Blood cultures so far negative - Discussed in detail with infectious disease, Dr. Ninetta Lights, recommended 6 weeks of IV vancomycin 750 mg q12hrs, finish on 10/19/2015,   - blood cultures 9/6, showed 1/2 coag negative staph. Repeat blood cultures 9/10 so far negative - PICC line  placed on 9/9  Active Problems:  Acute on Chronic neck and back pain, in the setting of IV drug abuse and high pain tolerance - Patient continues to complain of significant 10/10 back pain, placed on long-acting MS Contin, continue oxycodone, tramadol for moderate pain, Robaxin. I have placed on IV Toradol for 5 doses.    IVDU (intravenous drug user) -Patient strongly counseled on quitting drug use    Code Status:Full code  family Communication: Discussed in detail with the patient, all imaging results, lab results explained to the patient.     Disposition Plan:  nursing facility in a.m. if bed available for IV antibiotics.   Time Spent in minutes   25  minutes  Procedures  CT-guided aspiration of the abscess   Consults  Neurosurgery IR Infectious disease  DVT Prophylaxis  SCDs  Medications  Scheduled Meds: . ALPRAZolam  1 mg Oral QID  . gabapentin  100 mg Oral Q12H  . ketorolac  15 mg Intravenous 4 times per day  . loratadine  10 mg Oral Daily  . mesalamine  1.2 g Oral BID  . methocarbamol  500 mg Oral TID  . metoprolol tartrate  12.5 mg Oral BID  . morphine  30 mg Oral Q12H  . OLANZapine  10 mg Oral QHS  . traZODone  100 mg Oral BID  . vancomycin  750 mg Intravenous Q12H   Continuous Infusions:   PRN Meds:.albuterol, dicyclomine, HYDROmorphone (DILAUDID) injection, hydrOXYzine, nitroGLYCERIN, oxyCODONE, sodium chloride, traMADol   Antibiotics   Anti-infectives    Start     Dose/Rate Route Frequency Ordered Stop   09/08/15 0400  vancomycin (VANCOCIN) IVPB 750 mg/150 ml premix     750 mg 150 mL/hr over 60 Minutes Intravenous Every 12 hours 09/07/15 1417     09/07/15 1600  vancomycin (VANCOCIN) 1,500 mg in sodium chloride 0.9 % 500 mL IVPB     1,500 mg 250 mL/hr over 120 Minutes Intravenous  Once 09/07/15 1417 09/07/15 1847   09/07/15 1415  cefTRIAXone (ROCEPHIN) 2 g in dextrose 5 % 50 mL IVPB  Status:  Discontinued     2 g 100 mL/hr over 30 Minutes  Intravenous Every 24 hours 09/07/15 1410 09/08/15 1113        Subjective:   [name] was seen and examined today.  continues to complain of 10/10 back pain, crying.  Afebrile. Patient denies dizziness, chest pain, shortness of breath, abdominal pain, N/V/D/C. No new weakness, numbess, tingling. No acute events overnight.    Objective:   Blood pressure 141/85, pulse 94, temperature 98.7 F (37.1 C), temperature source Oral, resp. rate 18, height 5\' 6"  (1.676 m), weight 68.9 kg (151 lb 14.4 oz), SpO2 99 %.  Wt Readings from Last 3 Encounters:  09/11/15 68.9 kg (151 lb 14.4 oz)  08/11/15 71.305 kg (157 lb 3.2 oz)     Intake/Output Summary (Last 24 hours) at 09/11/15 1114 Last data filed at 09/11/15 0938  Gross per 24 hour  Intake 2358.75 ml  Output   2300 ml  Net  58.75 ml    Exam  General: Alert and oriented x 3, NAD  HEENT:  PERRLA, EOMI, Anicteric Sclera, mucous membranes moist.   Neck: Supple, no JVD, no masses  CVS: S1 S2 clear, RRR.  Respiratory: CTAB  Abdomen: Soft, nontender, nondistended, + bowel sounds  Ext: no cyanosis clubbing or edema  Neuro: no new deficits   Skin: No rashes  Psych: Normal affect and demeanor, alert and oriented x3    Data Review   Micro Results Recent Results (from the past 240 hour(s))  Blood culture (routine x 2)     Status: None   Collection Time: 09/06/15  4:39 PM  Result Value Ref Range Status   Specimen Description BLOOD LEFT ANTECUBITAL  Final   Special Requests BOTTLES DRAWN AEROBIC AND ANAEROBIC 5CC  Final   Culture  Setup Time   Final    GRAM POSITIVE COCCI IN CLUSTERS IN BOTH AEROBIC AND ANAEROBIC BOTTLES CRITICAL RESULT CALLED TO, READ BACK BY AND VERIFIED WITH: N ROBERTSON,RN AT 1215 09/07/15 BY L BENFIELD    Culture STAPHYLOCOCCUS SPECIES (COAGULASE NEGATIVE)  Final   Report Status 09/09/2015 FINAL  Final   Organism ID, Bacteria STAPHYLOCOCCUS SPECIES (COAGULASE NEGATIVE)  Final      Susceptibility    Staphylococcus species (coagulase negative) - MIC*    CIPROFLOXACIN <=0.5 SENSITIVE Sensitive     ERYTHROMYCIN 0.5 SENSITIVE Sensitive     GENTAMICIN <=0.5 SENSITIVE Sensitive     OXACILLIN <=0.25 SENSITIVE Sensitive     TETRACYCLINE  2 SENSITIVE Sensitive     VANCOMYCIN 1 SENSITIVE Sensitive     TRIMETH/SULFA <=10 SENSITIVE Sensitive     CLINDAMYCIN <=0.25 SENSITIVE Sensitive     RIFAMPIN <=0.5 SENSITIVE Sensitive     Inducible Clindamycin NEGATIVE Sensitive     * STAPHYLOCOCCUS SPECIES (COAGULASE NEGATIVE)  Blood culture (routine x 2)     Status: None (Preliminary result)   Collection Time: 09/06/15  8:30 PM  Result Value Ref Range Status   Specimen Description BLOOD RIGHT HAND  Final   Special Requests IN PEDIATRIC BOTTLE 1CC  Final   Culture NO GROWTH 4 DAYS  Final   Report Status PENDING  Incomplete  Culture, routine-abscess     Status: None   Collection Time: 09/07/15  2:41 PM  Result Value Ref Range Status   Specimen Description ABSCESS BACK  Final   Special Requests CT GUIDED ASPIRATION OF PARASPINOUS ABSCESS T3 T4  Final   Gram Stain   Final    ABUNDANT WBC PRESENT, PREDOMINANTLY PMN NO SQUAMOUS EPITHELIAL CELLS SEEN RARE GRAM POSITIVE COCCI IN PAIRS Performed at [facility]    Culture   Final    MODERATE METHICILLIN RESISTANT STAPHYLOCOCCUS AUREUS Note: RIFAMPIN AND GENTAMICIN SHOULD NOT BE USED AS SINGLE DRUGS FOR TREATMENT OF STAPH INFECTIONS. This organism DOES NOT demonstrate inducible Clindamycin resistance in vitro. CRITICAL RESULT CALLED TO, READ BACK BY AND VERIFIED WITH: ROBIN Z 9/10  @1055  BY REAMM Performed at [facility]    Report Status 09/10/2015 FINAL  Final   Organism ID, Bacteria METHICILLIN RESISTANT STAPHYLOCOCCUS AUREUS  Final      Susceptibility   Methicillin resistant staphylococcus aureus - MIC*    CLINDAMYCIN <=0.25 SENSITIVE Sensitive     ERYTHROMYCIN >=8 RESISTANT Resistant     GENTAMICIN <=0.5 SENSITIVE Sensitive      LEVOFLOXACIN 4 INTERMEDIATE Intermediate     OXACILLIN >=4 RESISTANT Resistant     RIFAMPIN <=0.5 SENSITIVE Sensitive     TRIMETH/SULFA <=10 SENSITIVE Sensitive     VANCOMYCIN 1 SENSITIVE Sensitive     TETRACYCLINE <=1 SENSITIVE Sensitive     * MODERATE METHICILLIN RESISTANT STAPHYLOCOCCUS AUREUS  Anaerobic culture     Status: None (Preliminary result)   Collection Time: 09/07/15  2:41 PM  Result Value Ref Range Status   Specimen Description ABSCESS BACK  Final   Special Requests CT GUIDED ASPIRATION OF APRASPINOUS ABSCESS T3 T4  Final   Gram Stain   Final    ABUNDANT WBC PRESENT, PREDOMINANTLY PMN NO SQUAMOUS EPITHELIAL CELLS SEEN RARE GRAM POSITIVE COCCI IN PAIRS Performed at [facility]    Culture   Final    NO ANAEROBES ISOLATED; CULTURE IN PROGRESS FOR 5 DAYS Performed at [facility]    Report Status PENDING  Incomplete  Fungus culture w smear     Status: None (Preliminary result)   Collection Time: 09/07/15  2:41 PM  Result Value Ref Range Status   Specimen Description ABSCESS BACK  Final   Special Requests CT GUIDED ASPIRATION OF PARASPINOUS ABSCESS T3 T4  Final   Fungal Smear   Final    NO YEAST OR FUNGAL ELEMENTS SEEN Performed at [facility]    Culture   Final    CULTURE IN PROGRESS FOR FOUR WEEKS Performed at [facility]    Report Status PENDING  Incomplete  MRSA PCR Screening     Status: None   Collection Time: 09/08/15  6:28 PM  Result Value  Ref Range Status   MRSA by PCR NEGATIVE NEGATIVE Final    Comment:        The GeneXpert MRSA Assay (FDA approved for NASAL specimens only), is one component of a comprehensive MRSA colonization surveillance program. It is not intended to diagnose MRSA infection nor to guide or monitor treatment for MRSA infections.     Radiology Reports Dg Chest 2 View  09/05/2015   CLINICAL DATA:  54 year old female with recurrent pneumonia like symptoms  EXAM: CHEST  2 VIEW  COMPARISON:   Prior chest x-ray 08/12/2015  FINDINGS: Developing linear opacities in the right lower lobe most consistent with areas of atelectasis or scarring. No focal airspace consolidation. Cardiac and mediastinal contours remain within normal limits. No pleural effusion or pneumothorax. No suspicious nodule or mass. No acute osseous abnormality.  IMPRESSION: Linear atelectasis versus scarring in the right lower lobe.   Electronically Signed   By: Malachy Moan M.D.   On: 09/05/2015 14:51   Ct Angio Chest Pe W/cm &/or Wo Cm  09/05/2015   CLINICAL DATA:  54 year old female with pain across the upper chest and shortness of breath  EXAM: CT ANGIOGRAPHY CHEST WITH CONTRAST  TECHNIQUE: Multidetector CT imaging of the chest was performed using the standard protocol during bolus administration of intravenous contrast. Multiplanar CT image reconstructions and MIPs were obtained to evaluate the vascular anatomy.  CONTRAST:  OMNIPAQUE IOHEXOL 350 MG/ML SOLN  COMPARISON:  Chest radiograph dated 09/05/2015  FINDINGS: Evaluation of this exam is limited due to respiratory motion artifact.  There is linea atelectasis/scarring in the lingula. There is partial consolidation of the right middle lobe possibly atelectasis versus pneumonia. Right lower lobe atelectatic changes noted. There is no pleural effusion or pneumothorax. The central airways are patent.  The visualized thoracic aorta appears unremarkable. No CT evidence of pulmonary embolism. There is no cardiomegaly or pericardial effusion. There is no hilar adenopathy. Top-normal upper mediastinal lymph nodes noted. The thyroid gland appears unremarkable.  There is a bilobed appearing paraspinal soft tissue mass at T3-T4 most compatible with a neoplasm. The right lobe of the mass measures approximately 3.3 x 2.0 cm and the left lobe of the lesion measures approximately 2.9 x 1.2 cm. There is associated destructive changes and fracture of the inferior endplate of the T3 and  superior endplate of T4 compatible with pathologic fracture. Differential includes a neurogenic tumor, non neurogenic tumors such as chondroid or osseous neoplasm, lymphoma. Other infectious process is not excluded. Clinical correlation is recommended. MRI without and with contrast is recommended for further characterization.  The visualized upper abdomen is unremarkable.  Review of the MIP images confirms the above findings.  IMPRESSION: No CT evidence of pulmonary embolism.  Bilobed paraspinal soft tissue mass centered at T3-T4 with mild destructive changes and pathologic fracture of the adjacent vertebra most compatible with neoplastic process. MRI is recommended for further characterization.   Electronically Signed   By: Elgie Collard M.D.   On: 09/05/2015 23:44   Mr Thoracic Spine W Wo Contrast  09/06/2015   CLINICAL DATA:  Two week history of cough and shortness of breath. Stabbing pain in the thoracic spinal region with inspiration and cough. Abnormal CT at T3-4  EXAM: MRI THORACIC SPINE WITHOUT AND WITH CONTRAST  TECHNIQUE: Multiplanar and multiecho pulse sequences of the thoracic spine were obtained without and with intravenous contrast.  CONTRAST:  13mL MULTIHANCE GADOBENATE DIMEGLUMINE 529 MG/ML IV SOLN  COMPARISON:  CT chest 09/05/2015  FINDINGS: Scout  views in the cervical regions show degenerative spondylosis most pronounced at T5-6 and T6-7.  In the thoracic region, there is no abnormal process centered at the T3-4 disc space with involvement of the T3 and T4 vertebral bodies and paravertebral soft tissues. The findings are most consistent with discitis osteomyelitis at this level. There is thickening and enhancement of the epidural tissues, resulting in constriction of the thecal sac and slight displacement and deformity of the cord. No definite abnormal cord signal. One could question early abnormal T2 signal. Paravertebral material is largely enhancing, though there is some nonenhancing fluid  on the right consistent with abscess.  No involvement elsewhere in the thoracic region is identified. There is degenerative disc disease at T11-12 and T12-L1.  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 Paravertebral phlegmonous inflammation with early abscess formation on the right. Epidural phlegmonous inflammation with constriction of the thecal sac and slight deformity and displacement of the cord. Question early abnormal T2 signal in the cord. The possibility of tumor is considered but felt less likely.  Critical Value/emergent results were called by telephone at the time of interpretation on 09/06/2015 at 3:22 pm to [facility] , who verbally acknowledged these results.   Electronically Signed   By: Paulina Fusi M.D.   On: 09/06/2015 15:27   Ct Aspiration  09/07/2015   CLINICAL DATA:  Evidence of thoracic discitis and osteomyelitis at the T3-4 level by prior MRI and CT with abnormal paraspinous fluid/phlegmon present. Aspiration of the paraspinous soft tissues has been requested for diagnostic purposes of determining infectious organism.  EXAM: CT GUIDED ASPIRATION OF THORACIC PARASPINAL SOFT TISSUES  ANESTHESIA/SEDATION: 2.0  Mg IV Versed; 100 mcg IV Fentanyl  Total Moderate Sedation Time: 30 minutes.  PROCEDURE: The procedure risks, benefits, and alternatives were explained to the patient. Questions regarding the procedure were encouraged and answered. The patient understands and consents to the procedure.  The right posterior upper thoracic paraspinal region was prepped with Betadine in a sterile fashion, and a sterile drape was applied covering the operative field. A sterile gown and sterile gloves were used for the procedure. Local anesthesia was provided with 1% Lidocaine.  CT was performed in a prone position. Under CT guidance, an 18 gauge trocar needle was advanced into the right-sided paraspinous soft  tissues at the T3-4 level. Aspiration was performed. Aspirated fluid sample was sent for culture studies including a aerobic and anaerobic cultures and fungal culture.  COMPLICATIONS: None  FINDINGS: Aspiration of fluid in the right paraspinal region just opposite the inferior endplate of T3 yielded grossly purulent fluid. Approximately 2 mL of fluid was able to be aspirated.  IMPRESSION: CT-guided aspiration at the level of right paraspinous soft tissues at the T3-4 level yielding grossly purulent fluid. 2 mL of fluid was able to be aspirated and sent for culture analysis.   Electronically Signed   By: Irish Lack M.D.   On: 09/07/2015 17:33    CBC  Recent Labs Lab 09/05/15 2024 09/06/15 1224 09/07/15 0539 09/08/15 0827 09/09/15 0704  WBC 9.0 9.4 7.1 4.9 5.7  HGB 11.9* 11.8* 11.5* 11.1* 11.4*  HCT 36.5 37.1 35.6* 33.0* 34.7*  PLT 271 337 284 212 223  MCV 89.0 88.3 87.3 85.9 86.3  MCH 29.0 28.1 28.2 28.9 28.4  MCHC 32.6 31.8 32.3 33.6 32.9  RDW 15.7* 15.7* 15.6* 15.3 15.0  LYMPHSABS  --   --   --   --  1.9  MONOABS  --   --   --   --  0.7  EOSABS  --   --   --   --  0.1  BASOSABS  --   --   --   --  0.0    Chemistries   Recent Labs Lab 09/06/15 1224 09/07/15 0539 09/08/15 0827 09/09/15 0704 09/10/15 0115  NA 136 134* 138 138 134*  K 3.4* 3.5 3.6 3.6 4.1  CL 97* 97* 105 104 101  CO2 33* 28 27 27 26   GLUCOSE 81 64* 96 107* 107*  BUN <5* <5* <5* <5* <5*  CREATININE 0.66 0.55 0.55 0.54 0.52  CALCIUM 8.4* 8.1* 8.2* 8.3* 8.2*  MG  --   --  1.7 1.5* 1.8  AST  --  15  --   --   --   ALT  --  15  --   --   --   ALKPHOS  --  80  --   --   --   BILITOT  --  0.7  --   --   --    ------------------------------------------------------------------------------------------------------------------ estimated creatinine clearance is 73.5 mL/min (by C-G formula based on Cr of  0.52). ------------------------------------------------------------------------------------------------------------------ No results for input(s): HGBA1C in the last 72 hours. ------------------------------------------------------------------------------------------------------------------ No results for input(s): CHOL, HDL, LDLCALC, TRIG, CHOLHDL, LDLDIRECT in the last 72 hours. ------------------------------------------------------------------------------------------------------------------ No results for input(s): TSH, T4TOTAL, T3FREE, THYROIDAB in the last 72 hours.  Invalid input(s): FREET3 ------------------------------------------------------------------------------------------------------------------ No results for input(s): VITAMINB12, FOLATE, FERRITIN, TIBC, IRON, RETICCTPCT in the last 72 hours.  Coagulation profile  Recent Labs Lab 09/07/15 0539  INR 1.15    No results for input(s): DDIMER in the last 72 hours.  Cardiac Enzymes No results for input(s): CKMB, TROPONINI, MYOGLOBIN in the last 168 hours.  Invalid input(s): CK ------------------------------------------------------------------------------------------------------------------ Invalid input(s): POCBNP  No results for input(s): GLUCAP in the last 72 hours.   RAI,RIPUDEEP M.D. [facility] 09/11/2015, 11:14 AM  Pager: [phone] Between 7am to 7pm - call Pager - [phone]  After 7pm go to [email or web address] - password [email or web address]  Call night coverage person covering after 7pm

## 2015-09-12 LAB — CBC
HCT: 33.7 % — ABNORMAL LOW (ref 36.0–46.0)
Hemoglobin: 11.1 g/dL — ABNORMAL LOW (ref 12.0–15.0)
MCH: 28.9 pg (ref 26.0–34.0)
MCHC: 32.9 g/dL (ref 30.0–36.0)
MCV: 87.8 fL (ref 78.0–100.0)
PLATELETS: 157 10*3/uL (ref 150–400)
RBC: 3.84 MIL/uL — ABNORMAL LOW (ref 3.87–5.11)
RDW: 15.2 % (ref 11.5–15.5)
WBC: 5.9 10*3/uL (ref 4.0–10.5)

## 2015-09-12 LAB — BASIC METABOLIC PANEL
Anion gap: 8 (ref 5–15)
BUN: 6 mg/dL (ref 6–20)
CALCIUM: 8.3 mg/dL — AB (ref 8.9–10.3)
CHLORIDE: 100 mmol/L — AB (ref 101–111)
CO2: 27 mmol/L (ref 22–32)
CREATININE: 0.75 mg/dL (ref 0.44–1.00)
GFR calc Af Amer: >60 mL/min (ref >60)
GFR calc non Af Amer: >60 mL/min (ref >60)
Glucose, Bld: 151 mg/dL — ABNORMAL HIGH (ref 65–99)
Potassium: 3.3 mmol/L — ABNORMAL LOW (ref 3.5–5.1)
SODIUM: 135 mmol/L (ref 135–145)

## 2015-09-12 LAB — ANAEROBIC CULTURE

## 2015-09-12 MED ORDER — METOPROLOL TARTRATE 25 MG PO TABS: 12.5000 mg | ORAL_TABLET | Freq: Two times a day (BID) | ORAL | Status: DC

## 2015-09-12 MED ORDER — MORPHINE SULFATE ER 30 MG PO TBCR: 30.0000 mg | EXTENDED_RELEASE_TABLET | Freq: Two times a day (BID) | ORAL | Status: AC

## 2015-09-12 MED ORDER — LIDODERM 5 % EX PTCH: 1.0000 | MEDICATED_PATCH | Freq: Two times a day (BID) | CUTANEOUS | Status: AC

## 2015-09-12 MED ORDER — TRAMADOL HCL 50 MG PO TABS: 50.0000 mg | ORAL_TABLET | Freq: Four times a day (QID) | ORAL | Status: DC | PRN

## 2015-09-12 MED ORDER — METHOCARBAMOL 500 MG PO TABS: 500.0000 mg | ORAL_TABLET | Freq: Three times a day (TID) | ORAL | Status: DC

## 2015-09-12 MED ORDER — HEPARIN SOD (PORK) LOCK FLUSH 100 UNIT/ML IV SOLN
500.0000 [IU] | INTRAVENOUS | Status: AC | PRN
  Administered 2015-09-12: 250 [IU]

## 2015-09-12 MED ORDER — OXYCODONE HCL 5 MG PO TABS: 5.0000 mg | ORAL_TABLET | ORAL | Status: DC | PRN

## 2015-09-12 MED ORDER — ALPRAZOLAM 1 MG PO TABS: 1.0000 mg | ORAL_TABLET | Freq: Four times a day (QID) | ORAL | Status: AC

## 2015-09-12 MED ORDER — VANCOMYCIN HCL IN DEXTROSE 1-5 GM/200ML-% IV SOLN: 1000.0000 mg | Freq: Two times a day (BID) | INTRAVENOUS | Status: DC

## 2015-09-12 NOTE — Progress Notes (Signed)
Pt leaving on stretcher via ambulance to [facility].

## 2015-09-12 NOTE — Clinical Social Work Placement (Signed)
   CLINICAL SOCIAL WORK PLACEMENT  NOTE  Date:  09/12/2015  Patient Details  Name: Nicole Fitzgerald MRN: [id number] Date of Birth: 1961/05/07  Clinical Social Work is seeking post-discharge placement for this patient at the Skilled  Nursing Facility level of care (*CSW will initial, date and re-position this form in  chart as items are completed):  Yes   Patient/family provided with [place] Clinical Social Work Department's list of facilities offering this level of care within the geographic area requested by the patient (or if unable, by the patient's family).  Yes   Patient/family informed of their freedom to choose among providers that offer the needed level of care, that participate in Medicare, Medicaid or managed care program needed by the patient, have an available bed and are willing to accept the patient.  Yes   Patient/family informed of 's ownership interest in [facility] and [facility], as well as of the fact that they are under no obligation to receive care at these facilities.  PASRR submitted to EDS on       PASRR number received on       Existing PASRR number confirmed on 09/09/15 (Expires 9/14, facilities made aware.)     FL2 transmitted to all facilities in geographic area requested by pt/family on 09/09/15     FL2 transmitted to all facilities within larger geographic area on       Patient informed that his/her managed care company has contracts with or will negotiate with certain facilities, including the following:        Yes   Patient/family informed of bed offers received.  Patient chooses bed at  [facility] )     Physician recommends and patient chooses bed at      Patient to be transferred to  [facility] ) on 09/12/15.  Patient to be transferred to facility by  [facility] )     Patient family notified on 09/12/15 of transfer.  Name of family member notified:   (Pt's son, Nicole Fitzgerald )     PHYSICIAN Please  sign FL2     Additional Comment:    _______________________________________________ Vaughan Browner, LCSW 09/12/2015, 2:11 PM

## 2015-09-12 NOTE — Discharge Summary (Signed)
Physician Discharge Summary   Patient ID: Nicole Fitzgerald MRN: [id number] DOB/AGE: 08-28-1961 54 y.o.  Admit date: 09/06/2015 Discharge date: 09/12/2015  Primary Care Physician:  No primary care provider on file.  Discharge Diagnoses:    . Osteomyelitis . MRSA Paraspinal abscess . IVDU (intravenous drug user) . Chronic neck and back pain  Consults:    interventional radiology  Infectious disease  Neurosurgery   Recommendations for Outpatient Follow-up:   please continue vancomycin IV, stop date on 10/19/15  TESTS THAT NEED FOLLOW-UP obtain labs, Fax weekly labs to [phone]. labs CBC, CMP, VANC TROUGH, ESR, CRP     DIET: regular diet    Allergies:   Allergies  Allergen Reactions  . Aspirin Other (See Comments)    MD told not to take due to crohns     Discharge Medications:   Medication List    STOP taking these medications        oxyCODONE-acetaminophen 5-325 MG per tablet  Commonly known as:  PERCOCET/ROXICET     potassium chloride SA 20 MEQ tablet  Commonly known as:  K-DUR,KLOR-CON     predniSONE 20 MG tablet  Commonly known as:  DELTASONE      TAKE these medications        ALPRAZolam 1 MG tablet  Commonly known as:  XANAX  Take 1 tablet (1 mg total) by mouth 4 (four) times daily.     AMITIZA 24 MCG capsule  Generic drug:  lubiprostone  Take 24 mcg by mouth 2 (two) times daily as needed for constipation.     dicyclomine 20 MG tablet  Commonly known as:  BENTYL  Take 1 tablet (20 mg total) by mouth every 6 (six) hours as needed for spasms (abdominal cramping).     gabapentin 100 MG capsule  Commonly known as:  NEURONTIN  Take 100 mg by mouth every 12 (twelve) hours.     hydrOXYzine 25 MG tablet  Commonly known as:  ATARAX/VISTARIL  Take 1 tablet (25 mg total) by mouth every 6 (six) hours as needed for anxiety.     levocetirizine 5 MG tablet  Commonly known as:  XYZAL  Take 5 mg by mouth daily.     LIALDA 1.2 G EC tablet  Generic  drug:  mesalamine  Take 1.2 g by mouth 2 (two) times daily.     LIDODERM 5 %  Generic drug:  lidocaine  Place 1 patch onto the skin 2 (two) times daily.     methocarbamol 500 MG tablet  Commonly known as:  ROBAXIN  Take 1 tablet (500 mg total) by mouth 3 (three) times daily.     methylphenidate 20 MG tablet  Commonly known as:  RITALIN  Take 20 mg by mouth 3 (three) times daily.     metoprolol tartrate 25 MG tablet  Commonly known as:  LOPRESSOR  Take 0.5 tablets (12.5 mg total) by mouth 2 (two) times daily.     morphine 30 MG 12 hr tablet  Commonly known as:  MS CONTIN  Take 1 tablet (30 mg total) by mouth every 12 (twelve) hours.     NEXIUM 40 MG capsule  Generic drug:  esomeprazole  Take 40 mg by mouth 2 (two) times daily.     OLANZapine 10 MG tablet  Commonly known as:  ZYPREXA  Take 10 mg by mouth at bedtime.     oxyCODONE 5 MG immediate release tablet  Commonly known as:  Oxy IR/ROXICODONE  Take 1 tablet (  5 mg total) by mouth every 4 (four) hours as needed for moderate pain or breakthrough pain.     PREMPRO 0.625-2.5 MG per tablet  Generic drug:  estrogen (conjugated)-medroxyprogesterone  Take 1 tablet by mouth daily.     PROAIR HFA 108 (90 BASE) MCG/ACT inhaler  Generic drug:  albuterol  Inhale 1 puff into the lungs every 4 (four) hours as needed for wheezing or shortness of breath.     traMADol 50 MG tablet  Commonly known as:  ULTRAM  Take 1 tablet (50 mg total) by mouth every 6 (six) hours as needed for severe pain.     traZODone 100 MG tablet  Commonly known as:  DESYREL  Take 100 mg by mouth 2 (two) times daily.     vancomycin 1 GM/200ML Soln  Commonly known as:  VANCOCIN  Inject 200 mLs (1,000 mg total) into the vein every 12 (twelve) hours. Stop date 10/19/15         Brief H and P: For complete details please refer to admission H and P, but in briefJanet Fitzgerald is a 54 y.o. female  H/o IV drug user reported quit "cold [place] two months ago" ,  h/o recent hospitalization at [facility] long for community acquired pneumonia, presented to the ED with lower back pain. She also has a history of schizophrenia, Crohn's disease, Patient states that she was seen at [facility] yesterday for back pain. Patient had a CT of her chest done which found a mass on her T3-T4 vertebrae most consistent with a neoplastic process. MRI was recommended at that time however, MRI was not available at that hour at [facility]. Patient was recommended to follow up outpatient with an MRI however, patient states that her pain is unbearable and she needed to be seen sooner. Pain was 10 out of 10. Nonradiating. Denies bowel or bladder incontinence, saddle anesthesia, dysuria, weakness, numbness or tingling, chest pain, shortness of breath, fevers, chills.    Hospital Course:   T3-T4 Paraspinal abscess/discitis/ MRSA abscess in the setting of IV drug abuse - Patient had CT-guided aspiration of the abscess on 9/7, patient was started on vancomycin and Rocephin on 9/7. Spinal abscess cultures came back positive for MRSA. Rocephin was discontinued. Blood cultures so far negative.  Patient was followed closely by infectious disease,  Dr. Johnnye Sima, recommended 6 weeks of IV vancomycin , stop date on 10/19/15.  - blood cultures 9/6, showed 1/2 coag negative staph. Repeat blood cultures 9/10 so far negative. PICC line was placed on 9/9  please fax weekly CBC, CMET,  ESR, CRP, Vanco trough to  ID office [phone].   Acute on Chronic neck and back pain, in the setting of IV drug abuse and high pain tolerance -  The patient was placed on long-acting MS Contin, continue oxycodone, tramadol for moderate pain, Robaxin.    IVDU (intravenous drug user) -Patient strongly counseled on quitting drug use    Day of Discharge BP 94/72 mmHg  Pulse 100  Temp(Src) 98.3 F (36.8 C) (Oral)  Resp 20  Ht '5\' 6"'  (1.676 m)  Wt 68.9 kg (151 lb 14.4 oz)  BMI 24.53 kg/m2  SpO2  96%  Physical Exam: General: Alert and awake oriented x3 not in any acute distress. HEENT: anicteric sclera, pupils reactive to light and accommodation CVS: S1-S2 clear no murmur rubs or gallops Chest: clear to auscultation bilaterally, no wheezing rales or rhonchi Abdomen: soft nontender, nondistended, normal bowel sounds Extremities: no cyanosis, clubbing or  edema noted bilaterally Neuro:  No new deficits   The results of significant diagnostics from this hospitalization (including imaging, microbiology, ancillary and laboratory) are listed below for reference.    LAB RESULTS: Basic Metabolic Panel:  Recent Labs Lab 09/10/15 0115 09/12/15 0755  NA 134* 135  K 4.1 3.3*  CL 101 100*  CO2 26 27  GLUCOSE 107* 151*  BUN <5* 6  CREATININE 0.52 0.75  CALCIUM 8.2* 8.3*  MG 1.8  --    Liver Function Tests:  Recent Labs Lab 09/07/15 0539  AST 15  ALT 15  ALKPHOS 80  BILITOT 0.7  PROT 6.5  ALBUMIN 2.3*   No results for input(s): LIPASE, AMYLASE in the last 168 hours. No results for input(s): AMMONIA in the last 168 hours. CBC:  Recent Labs Lab 09/09/15 0704 09/12/15 0755  WBC 5.7 5.9  NEUTROABS 3.1  --   HGB 11.4* 11.1*  HCT 34.7* 33.7*  MCV 86.3 87.8  PLT 223 157   Cardiac Enzymes: No results for input(s): CKTOTAL, CKMB, CKMBINDEX, TROPONINI in the last 168 hours. BNP: Invalid input(s): POCBNP CBG: No results for input(s): GLUCAP in the last 168 hours.  Significant Diagnostic Studies:  Mr Thoracic Spine W Wo Contrast  09/06/2015   CLINICAL DATA:  Two week history of cough and shortness of breath. Stabbing pain in the thoracic spinal region with inspiration and cough. Abnormal CT at T3-4  EXAM: MRI THORACIC SPINE WITHOUT AND WITH CONTRAST  TECHNIQUE: Multiplanar and multiecho pulse sequences of the thoracic spine were obtained without and with intravenous contrast.  CONTRAST:  62m MULTIHANCE GADOBENATE DIMEGLUMINE 529 MG/ML IV SOLN  COMPARISON:  CT chest  09/05/2015  FINDINGS: Scout views in the cervical regions show degenerative spondylosis most pronounced at T5-6 and T6-7.  In the thoracic region, there is no abnormal process centered at the T3-4 disc space with involvement of the T3 and T4 vertebral bodies and paravertebral soft tissues. The findings are most consistent with discitis osteomyelitis at this level. There is thickening and enhancement of the epidural tissues, resulting in constriction of the thecal sac and slight displacement and deformity of the cord. No definite abnormal cord signal. One could question early abnormal T2 signal. Paravertebral material is largely enhancing, though there is some nonenhancing fluid on the right consistent with abscess.  No involvement elsewhere in the thoracic region is identified. There is degenerative disc disease at T11-12 and T12-L1.  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 Paravertebral phlegmonous inflammation with early abscess formation on the right. Epidural phlegmonous inflammation with constriction of the thecal sac and slight deformity and displacement of the cord. Question early abnormal T2 signal in the cord. The possibility of tumor is considered but felt less likely.  Critical Value/emergent results were called by telephone at the time of interpretation on 09/06/2015 at 3:22 pm [facility] verb[facility] these results.   Electronically Signed   By: MNelson ChimesM.D.   On: 09/06/2015 15:27   Ct Aspiration  09/07/2015   CLINICAL DATA:  Evidence of thoracic discitis and osteomyelitis at the T3-4 level by prior MRI and CT with abnormal paraspinous fluid/phlegmon present. Aspiration of the paraspinous soft tissues has been requested for diagnostic purposes of determining infectious organism.  EXAM: CT GUIDED ASPIRATION OF THORACIC PARASPINAL SOFT TISSUES  ANESTHESIA/SEDATION: 2.0  Mg IV Versed;  100 mcg IV Fentanyl  Total Moderate  Sedation Time: 30 minutes.  PROCEDURE: The procedure risks, benefits, and alternatives were explained to the patient. Questions regarding the procedure were encouraged and answered. The patient understands and consents to the procedure.  The right posterior upper thoracic paraspinal region was prepped with Betadine in a sterile fashion, and a sterile drape was applied covering the operative field. A sterile gown and sterile gloves were used for the procedure. Local anesthesia was provided with 1% Lidocaine.  CT was performed in a prone position. Under CT guidance, an 18 gauge trocar needle was advanced into the right-sided paraspinous soft tissues at the T3-4 level. Aspiration was performed. Aspirated fluid sample was sent for culture studies including a aerobic and anaerobic cultures and fungal culture.  COMPLICATIONS: None  FINDINGS: Aspiration of fluid in the right paraspinal region just opposite the inferior endplate of T3 yielded grossly purulent fluid. Approximately 2 mL of fluid was able to be aspirated.  IMPRESSION: CT-guided aspiration at the level of right paraspinous soft tissues at the T3-4 level yielding grossly purulent fluid. 2 mL of fluid was able to be aspirated and sent for culture analysis.   Electronically Signed   By: Aletta Edouard M.D.   On: 09/07/2015 17:33    2D ECHO:   Disposition and Follow-up:     Discharge Instructions    Diet - low sodium heart healthy    Complete by:  As directed      Increase activity slowly    Complete by:  As directed             DISPOSITION: skilled nursing facility when bed available   DISCHARGE FOLLOW-UP Follow-up Information    Follow up with Bobby Rumpf, MD. Schedule an appointment as soon as possible for a visit in 6 weeks.   Specialty:  Infectious Diseases   Why:  for hospital follow-up., obtain labs, Fax weekly labs to [phone]. labs CBC, CMP, VANC TROUGH, ESR, CRP   Contact  information:   [address] [place] [address] [phone]        Time spent on Discharge: 35 mins   Signed:   Braxon Suder M.D. [facility] 09/12/2015, 11:05 AM Pager: [phone]

## 2015-09-12 NOTE — Clinical Social Work Note (Signed)
Clinical Social Worker facilitated patient discharge including contacting patient family and facility to confirm patient discharge plans.  Clinical information faxed to facility and family agreeable with plan.  CSW arranged ambulance transport via [facility] to [facility].  RN to call report prior to discharge.  DC packet prepared and on chart for transport with number for report.   Clinical Social Worker will sign off for now as social work intervention is no longer needed. Please consult [place] again if new need arises.  Derenda Fennel, MSW, LCSWA [phone] 09/12/2015 2:12 PM

## 2015-09-12 NOTE — Progress Notes (Signed)
Report given to [facility] [phone]. Pt d/c with PICC in place. BP 77/56, 94/72, 89/62 after pain meds administered. [place] familiar w pt.

## 2015-09-12 NOTE — Care Management Important Message (Signed)
Important Message  Patient Details  Name: Tanyiah Krontz MRN: [id number] Date of Birth: 1961/05/05   Medicare Important Message Given:  Yes-third notification given    Leone Haven, RN 09/12/2015, 9:52 AMImportant Message  Patient Details  Name: Shuana Bogucki MRN: [id number] Date of Birth: [birth year]   Medicare Important Message Given:  Yes-third notification given    Leone Haven, RN 09/12/2015, 9:52 AM

## 2015-09-13 LAB — HEPATITIS C VRS RNA DETECT BY PCR-QUAL: HEPATITIS C VRS RNA BY PCR-QUAL: NEGATIVE

## 2015-09-14 LAB — HEPATITIS C GENOTYPE

## 2015-09-15 LAB — CULTURE, BLOOD (ROUTINE X 2)
CULTURE: NO GROWTH
Culture: NO GROWTH

## 2015-09-15 LAB — HEPATITIS B SURFACE ANTIGEN: HEP B S AG: NEGATIVE

## 2015-10-05 LAB — FUNGUS CULTURE W SMEAR: FUNGAL SMEAR: NONE SEEN

## 2015-10-11 ENCOUNTER — Ambulatory Visit (INDEPENDENT_AMBULATORY_CARE_PROVIDER_SITE_OTHER): Payer: Medicare Other | Admitting: Infectious Diseases

## 2015-10-11 ENCOUNTER — Encounter: Payer: Self-pay | Admitting: Infectious Diseases

## 2015-10-11 VITALS — BP 113/78 | HR 96 | Temp 98.6°F

## 2015-10-11 DIAGNOSIS — R894 Abnormal immunological findings in specimens from other organs, systems and tissues: Secondary | ICD-10-CM

## 2015-10-11 DIAGNOSIS — M4620 Osteomyelitis of vertebra, site unspecified: Secondary | ICD-10-CM | POA: Diagnosis not present

## 2015-10-11 DIAGNOSIS — F209 Schizophrenia, unspecified: Secondary | ICD-10-CM | POA: Diagnosis not present

## 2015-10-11 DIAGNOSIS — R768 Other specified abnormal immunological findings in serum: Secondary | ICD-10-CM

## 2015-10-11 NOTE — Assessment & Plan Note (Signed)
Her RNA is negative. She is immune.  She needs no further testing (unless she has new risk factors)

## 2015-10-11 NOTE — Progress Notes (Signed)
   Subjective:    Patient ID: Nicole Fitzgerald, female    DOB: 1961/11/05, 54 y.o.   MRN: [id number]  HPI 54 yo F with hx of Crohn's disease who was adm to [facility] 08-2015 for pneumonia (BCx-). She was then sent to SNF for 2 weeks, home for 1 week. She states that she has been having low back pain.  She states that over the weekend, her pain became so severe that she was unable to sit up. She spent the weekend laying flat.  She came to [facility] 09-06-15  And denied fever or chills or recent wounds. She was found to have a T3-4 abscess and underwent IR aspirate which grew MRSA. She also had a Bcx which was 1/2 MSSE.  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 She was d/c to SNF on 9-12 on vancomycin.  CRP 3.3 on 10-10. Vanco 9.5, Cr 0.68.   She is currently tearful- complains of back pain. States she is getting morphine and tramadol at SNF for pain.  States she has had f/c, that she had temp taken at [facility] and it was 101 yesterday.   Has been hypertensive, attributes this to pain. Attributes her headaches to being nervous.  No problems with PIC line.  Has f/u with neurosurgery.   Review of Systems  Constitutional: Positive for fever and chills. Negative for appetite change.  Respiratory: Negative for shortness of breath.   Gastrointestinal: Positive for constipation. Negative for diarrhea.  Genitourinary: Negative for difficulty urinating.  Musculoskeletal: Positive for back pain.  Neurological: Positive for headaches.  Psychiatric/Behavioral: Positive for dysphoric mood. The patient is nervous/anxious.        Objective:   Physical Exam  Constitutional: She appears well-developed and well-nourished.  HENT:  Mouth/Throat: No oropharyngeal exudate.  Eyes: EOM are normal. Pupils are equal, round, and reactive to light.  Neck: Neck supple.  Cardiovascular: Normal rate, regular rhythm and normal heart sounds.   Pulmonary/Chest: Effort normal and breath sounds normal.  Abdominal: Soft. Bowel sounds are  normal. There is no tenderness. There is no rebound.  Musculoskeletal:       Arms: Lymphadenopathy:    She has no cervical adenopathy.       Assessment & Plan:

## 2015-10-11 NOTE — Assessment & Plan Note (Signed)
Today is day 35 of anbx Would give her 1 more week of IV then change her to po bactrim Will see her back in 3 months.  Could consider repeat imaging then or if she has worsening pain.  Would ask that her pharmacy [facility] for vanco trough 15-20

## 2015-10-11 NOTE — Assessment & Plan Note (Signed)
She will need psych f/u. She is quite depressed, tearful.

## 2015-11-08 DIAGNOSIS — G061 Intraspinal abscess and granuloma: Secondary | ICD-10-CM | POA: Diagnosis not present

## 2015-11-08 DIAGNOSIS — F339 Major depressive disorder, recurrent, unspecified: Secondary | ICD-10-CM | POA: Diagnosis not present

## 2015-11-08 DIAGNOSIS — K5000 Crohn's disease of small intestine without complications: Secondary | ICD-10-CM | POA: Diagnosis not present

## 2015-11-08 DIAGNOSIS — Z79899 Other long term (current) drug therapy: Secondary | ICD-10-CM | POA: Diagnosis not present

## 2015-11-08 DIAGNOSIS — G894 Chronic pain syndrome: Secondary | ICD-10-CM | POA: Diagnosis not present

## 2015-11-09 DIAGNOSIS — Z6825 Body mass index (BMI) 25.0-25.9, adult: Secondary | ICD-10-CM | POA: Diagnosis not present

## 2015-11-09 DIAGNOSIS — M4624 Osteomyelitis of vertebra, thoracic region: Secondary | ICD-10-CM | POA: Diagnosis not present

## 2015-11-10 DIAGNOSIS — M4624 Osteomyelitis of vertebra, thoracic region: Secondary | ICD-10-CM | POA: Diagnosis not present

## 2015-11-10 DIAGNOSIS — F411 Generalized anxiety disorder: Secondary | ICD-10-CM | POA: Diagnosis not present

## 2015-11-10 DIAGNOSIS — F209 Schizophrenia, unspecified: Secondary | ICD-10-CM | POA: Diagnosis not present

## 2015-11-10 DIAGNOSIS — B9562 Methicillin resistant Staphylococcus aureus infection as the cause of diseases classified elsewhere: Secondary | ICD-10-CM | POA: Diagnosis not present

## 2015-11-10 DIAGNOSIS — K5000 Crohn's disease of small intestine without complications: Secondary | ICD-10-CM | POA: Diagnosis not present

## 2015-11-10 DIAGNOSIS — F1110 Opioid abuse, uncomplicated: Secondary | ICD-10-CM | POA: Diagnosis not present

## 2015-11-10 DIAGNOSIS — G061 Intraspinal abscess and granuloma: Secondary | ICD-10-CM | POA: Diagnosis not present

## 2015-11-10 DIAGNOSIS — I10 Essential (primary) hypertension: Secondary | ICD-10-CM | POA: Diagnosis not present

## 2015-11-10 DIAGNOSIS — F339 Major depressive disorder, recurrent, unspecified: Secondary | ICD-10-CM | POA: Diagnosis not present

## 2015-11-10 DIAGNOSIS — G894 Chronic pain syndrome: Secondary | ICD-10-CM | POA: Diagnosis not present

## 2015-11-10 DIAGNOSIS — M545 Low back pain: Secondary | ICD-10-CM | POA: Diagnosis not present

## 2015-11-10 DIAGNOSIS — Z87891 Personal history of nicotine dependence: Secondary | ICD-10-CM | POA: Diagnosis not present

## 2015-11-10 DIAGNOSIS — F29 Unspecified psychosis not due to a substance or known physiological condition: Secondary | ICD-10-CM | POA: Diagnosis not present

## 2015-11-14 DIAGNOSIS — F29 Unspecified psychosis not due to a substance or known physiological condition: Secondary | ICD-10-CM | POA: Diagnosis not present

## 2015-11-14 DIAGNOSIS — M545 Low back pain: Secondary | ICD-10-CM | POA: Diagnosis not present

## 2015-11-14 DIAGNOSIS — F411 Generalized anxiety disorder: Secondary | ICD-10-CM | POA: Diagnosis not present

## 2015-11-14 DIAGNOSIS — G061 Intraspinal abscess and granuloma: Secondary | ICD-10-CM | POA: Diagnosis not present

## 2015-11-14 DIAGNOSIS — G894 Chronic pain syndrome: Secondary | ICD-10-CM | POA: Diagnosis not present

## 2015-11-14 DIAGNOSIS — F1110 Opioid abuse, uncomplicated: Secondary | ICD-10-CM | POA: Diagnosis not present

## 2015-11-14 DIAGNOSIS — I10 Essential (primary) hypertension: Secondary | ICD-10-CM | POA: Diagnosis not present

## 2015-11-14 DIAGNOSIS — B9562 Methicillin resistant Staphylococcus aureus infection as the cause of diseases classified elsewhere: Secondary | ICD-10-CM | POA: Diagnosis not present

## 2015-11-14 DIAGNOSIS — M4624 Osteomyelitis of vertebra, thoracic region: Secondary | ICD-10-CM | POA: Diagnosis not present

## 2015-11-14 DIAGNOSIS — F209 Schizophrenia, unspecified: Secondary | ICD-10-CM | POA: Diagnosis not present

## 2015-11-14 DIAGNOSIS — K5000 Crohn's disease of small intestine without complications: Secondary | ICD-10-CM | POA: Diagnosis not present

## 2015-11-14 DIAGNOSIS — Z87891 Personal history of nicotine dependence: Secondary | ICD-10-CM | POA: Diagnosis not present

## 2015-11-14 DIAGNOSIS — F339 Major depressive disorder, recurrent, unspecified: Secondary | ICD-10-CM | POA: Diagnosis not present

## 2015-11-15 DIAGNOSIS — G894 Chronic pain syndrome: Secondary | ICD-10-CM | POA: Diagnosis not present

## 2015-11-15 DIAGNOSIS — F29 Unspecified psychosis not due to a substance or known physiological condition: Secondary | ICD-10-CM | POA: Diagnosis not present

## 2015-11-15 DIAGNOSIS — Z87891 Personal history of nicotine dependence: Secondary | ICD-10-CM | POA: Diagnosis not present

## 2015-11-15 DIAGNOSIS — K5000 Crohn's disease of small intestine without complications: Secondary | ICD-10-CM | POA: Diagnosis not present

## 2015-11-15 DIAGNOSIS — M4624 Osteomyelitis of vertebra, thoracic region: Secondary | ICD-10-CM | POA: Diagnosis not present

## 2015-11-15 DIAGNOSIS — M545 Low back pain: Secondary | ICD-10-CM | POA: Diagnosis not present

## 2015-11-15 DIAGNOSIS — I10 Essential (primary) hypertension: Secondary | ICD-10-CM | POA: Diagnosis not present

## 2015-11-15 DIAGNOSIS — F1110 Opioid abuse, uncomplicated: Secondary | ICD-10-CM | POA: Diagnosis not present

## 2015-11-15 DIAGNOSIS — F339 Major depressive disorder, recurrent, unspecified: Secondary | ICD-10-CM | POA: Diagnosis not present

## 2015-11-15 DIAGNOSIS — G061 Intraspinal abscess and granuloma: Secondary | ICD-10-CM | POA: Diagnosis not present

## 2015-11-15 DIAGNOSIS — F209 Schizophrenia, unspecified: Secondary | ICD-10-CM | POA: Diagnosis not present

## 2015-11-15 DIAGNOSIS — B9562 Methicillin resistant Staphylococcus aureus infection as the cause of diseases classified elsewhere: Secondary | ICD-10-CM | POA: Diagnosis not present

## 2015-11-15 DIAGNOSIS — F411 Generalized anxiety disorder: Secondary | ICD-10-CM | POA: Diagnosis not present

## 2015-11-16 DIAGNOSIS — G061 Intraspinal abscess and granuloma: Secondary | ICD-10-CM | POA: Diagnosis not present

## 2015-11-16 DIAGNOSIS — F1110 Opioid abuse, uncomplicated: Secondary | ICD-10-CM | POA: Diagnosis not present

## 2015-11-16 DIAGNOSIS — I10 Essential (primary) hypertension: Secondary | ICD-10-CM | POA: Diagnosis not present

## 2015-11-16 DIAGNOSIS — F411 Generalized anxiety disorder: Secondary | ICD-10-CM | POA: Diagnosis not present

## 2015-11-16 DIAGNOSIS — B9562 Methicillin resistant Staphylococcus aureus infection as the cause of diseases classified elsewhere: Secondary | ICD-10-CM | POA: Diagnosis not present

## 2015-11-16 DIAGNOSIS — Z87891 Personal history of nicotine dependence: Secondary | ICD-10-CM | POA: Diagnosis not present

## 2015-11-16 DIAGNOSIS — M4624 Osteomyelitis of vertebra, thoracic region: Secondary | ICD-10-CM | POA: Diagnosis not present

## 2015-11-16 DIAGNOSIS — F339 Major depressive disorder, recurrent, unspecified: Secondary | ICD-10-CM | POA: Diagnosis not present

## 2015-11-16 DIAGNOSIS — G894 Chronic pain syndrome: Secondary | ICD-10-CM | POA: Diagnosis not present

## 2015-11-16 DIAGNOSIS — M545 Low back pain: Secondary | ICD-10-CM | POA: Diagnosis not present

## 2015-11-16 DIAGNOSIS — K5000 Crohn's disease of small intestine without complications: Secondary | ICD-10-CM | POA: Diagnosis not present

## 2015-11-16 DIAGNOSIS — F209 Schizophrenia, unspecified: Secondary | ICD-10-CM | POA: Diagnosis not present

## 2015-11-16 DIAGNOSIS — F29 Unspecified psychosis not due to a substance or known physiological condition: Secondary | ICD-10-CM | POA: Diagnosis not present

## 2015-11-17 DIAGNOSIS — F29 Unspecified psychosis not due to a substance or known physiological condition: Secondary | ICD-10-CM | POA: Diagnosis not present

## 2015-11-17 DIAGNOSIS — G894 Chronic pain syndrome: Secondary | ICD-10-CM | POA: Diagnosis not present

## 2015-11-17 DIAGNOSIS — I10 Essential (primary) hypertension: Secondary | ICD-10-CM | POA: Diagnosis not present

## 2015-11-17 DIAGNOSIS — M4624 Osteomyelitis of vertebra, thoracic region: Secondary | ICD-10-CM | POA: Diagnosis not present

## 2015-11-17 DIAGNOSIS — M545 Low back pain: Secondary | ICD-10-CM | POA: Diagnosis not present

## 2015-11-17 DIAGNOSIS — K5000 Crohn's disease of small intestine without complications: Secondary | ICD-10-CM | POA: Diagnosis not present

## 2015-11-17 DIAGNOSIS — B9562 Methicillin resistant Staphylococcus aureus infection as the cause of diseases classified elsewhere: Secondary | ICD-10-CM | POA: Diagnosis not present

## 2015-11-17 DIAGNOSIS — Z87891 Personal history of nicotine dependence: Secondary | ICD-10-CM | POA: Diagnosis not present

## 2015-11-17 DIAGNOSIS — F339 Major depressive disorder, recurrent, unspecified: Secondary | ICD-10-CM | POA: Diagnosis not present

## 2015-11-17 DIAGNOSIS — F209 Schizophrenia, unspecified: Secondary | ICD-10-CM | POA: Diagnosis not present

## 2015-11-17 DIAGNOSIS — F1110 Opioid abuse, uncomplicated: Secondary | ICD-10-CM | POA: Diagnosis not present

## 2015-11-17 DIAGNOSIS — G061 Intraspinal abscess and granuloma: Secondary | ICD-10-CM | POA: Diagnosis not present

## 2015-11-17 DIAGNOSIS — F411 Generalized anxiety disorder: Secondary | ICD-10-CM | POA: Diagnosis not present

## 2015-11-21 DIAGNOSIS — G061 Intraspinal abscess and granuloma: Secondary | ICD-10-CM | POA: Diagnosis not present

## 2015-11-21 DIAGNOSIS — F209 Schizophrenia, unspecified: Secondary | ICD-10-CM | POA: Diagnosis not present

## 2015-11-21 DIAGNOSIS — K5000 Crohn's disease of small intestine without complications: Secondary | ICD-10-CM | POA: Diagnosis not present

## 2015-11-21 DIAGNOSIS — M545 Low back pain: Secondary | ICD-10-CM | POA: Diagnosis not present

## 2015-11-21 DIAGNOSIS — F411 Generalized anxiety disorder: Secondary | ICD-10-CM | POA: Diagnosis not present

## 2015-11-21 DIAGNOSIS — I10 Essential (primary) hypertension: Secondary | ICD-10-CM | POA: Diagnosis not present

## 2015-11-21 DIAGNOSIS — G894 Chronic pain syndrome: Secondary | ICD-10-CM | POA: Diagnosis not present

## 2015-11-21 DIAGNOSIS — F1110 Opioid abuse, uncomplicated: Secondary | ICD-10-CM | POA: Diagnosis not present

## 2015-11-21 DIAGNOSIS — F29 Unspecified psychosis not due to a substance or known physiological condition: Secondary | ICD-10-CM | POA: Diagnosis not present

## 2015-11-21 DIAGNOSIS — F339 Major depressive disorder, recurrent, unspecified: Secondary | ICD-10-CM | POA: Diagnosis not present

## 2015-11-21 DIAGNOSIS — Z87891 Personal history of nicotine dependence: Secondary | ICD-10-CM | POA: Diagnosis not present

## 2015-11-21 DIAGNOSIS — M4624 Osteomyelitis of vertebra, thoracic region: Secondary | ICD-10-CM | POA: Diagnosis not present

## 2015-11-21 DIAGNOSIS — B9562 Methicillin resistant Staphylococcus aureus infection as the cause of diseases classified elsewhere: Secondary | ICD-10-CM | POA: Diagnosis not present

## 2015-11-22 DIAGNOSIS — F411 Generalized anxiety disorder: Secondary | ICD-10-CM | POA: Diagnosis not present

## 2015-11-22 DIAGNOSIS — G894 Chronic pain syndrome: Secondary | ICD-10-CM | POA: Diagnosis not present

## 2015-11-22 DIAGNOSIS — F209 Schizophrenia, unspecified: Secondary | ICD-10-CM | POA: Diagnosis not present

## 2015-11-22 DIAGNOSIS — F29 Unspecified psychosis not due to a substance or known physiological condition: Secondary | ICD-10-CM | POA: Diagnosis not present

## 2015-11-22 DIAGNOSIS — G061 Intraspinal abscess and granuloma: Secondary | ICD-10-CM | POA: Diagnosis not present

## 2015-11-22 DIAGNOSIS — Z87891 Personal history of nicotine dependence: Secondary | ICD-10-CM | POA: Diagnosis not present

## 2015-11-22 DIAGNOSIS — B9562 Methicillin resistant Staphylococcus aureus infection as the cause of diseases classified elsewhere: Secondary | ICD-10-CM | POA: Diagnosis not present

## 2015-11-22 DIAGNOSIS — M545 Low back pain: Secondary | ICD-10-CM | POA: Diagnosis not present

## 2015-11-22 DIAGNOSIS — M4624 Osteomyelitis of vertebra, thoracic region: Secondary | ICD-10-CM | POA: Diagnosis not present

## 2015-11-22 DIAGNOSIS — F1110 Opioid abuse, uncomplicated: Secondary | ICD-10-CM | POA: Diagnosis not present

## 2015-11-22 DIAGNOSIS — F339 Major depressive disorder, recurrent, unspecified: Secondary | ICD-10-CM | POA: Diagnosis not present

## 2015-11-22 DIAGNOSIS — K5000 Crohn's disease of small intestine without complications: Secondary | ICD-10-CM | POA: Diagnosis not present

## 2015-11-22 DIAGNOSIS — I10 Essential (primary) hypertension: Secondary | ICD-10-CM | POA: Diagnosis not present

## 2015-11-28 DIAGNOSIS — F209 Schizophrenia, unspecified: Secondary | ICD-10-CM | POA: Diagnosis not present

## 2015-11-28 DIAGNOSIS — F1110 Opioid abuse, uncomplicated: Secondary | ICD-10-CM | POA: Diagnosis not present

## 2015-11-28 DIAGNOSIS — G061 Intraspinal abscess and granuloma: Secondary | ICD-10-CM | POA: Diagnosis not present

## 2015-11-28 DIAGNOSIS — B9562 Methicillin resistant Staphylococcus aureus infection as the cause of diseases classified elsewhere: Secondary | ICD-10-CM | POA: Diagnosis not present

## 2015-11-28 DIAGNOSIS — K5000 Crohn's disease of small intestine without complications: Secondary | ICD-10-CM | POA: Diagnosis not present

## 2015-11-28 DIAGNOSIS — F29 Unspecified psychosis not due to a substance or known physiological condition: Secondary | ICD-10-CM | POA: Diagnosis not present

## 2015-11-28 DIAGNOSIS — F411 Generalized anxiety disorder: Secondary | ICD-10-CM | POA: Diagnosis not present

## 2015-11-28 DIAGNOSIS — M4624 Osteomyelitis of vertebra, thoracic region: Secondary | ICD-10-CM | POA: Diagnosis not present

## 2015-11-28 DIAGNOSIS — G894 Chronic pain syndrome: Secondary | ICD-10-CM | POA: Diagnosis not present

## 2015-11-28 DIAGNOSIS — F339 Major depressive disorder, recurrent, unspecified: Secondary | ICD-10-CM | POA: Diagnosis not present

## 2015-11-28 DIAGNOSIS — Z87891 Personal history of nicotine dependence: Secondary | ICD-10-CM | POA: Diagnosis not present

## 2015-11-28 DIAGNOSIS — M545 Low back pain: Secondary | ICD-10-CM | POA: Diagnosis not present

## 2015-11-28 DIAGNOSIS — I10 Essential (primary) hypertension: Secondary | ICD-10-CM | POA: Diagnosis not present

## 2015-11-29 DIAGNOSIS — F1110 Opioid abuse, uncomplicated: Secondary | ICD-10-CM | POA: Diagnosis not present

## 2015-11-29 DIAGNOSIS — F411 Generalized anxiety disorder: Secondary | ICD-10-CM | POA: Diagnosis not present

## 2015-11-29 DIAGNOSIS — B9562 Methicillin resistant Staphylococcus aureus infection as the cause of diseases classified elsewhere: Secondary | ICD-10-CM | POA: Diagnosis not present

## 2015-11-29 DIAGNOSIS — G061 Intraspinal abscess and granuloma: Secondary | ICD-10-CM | POA: Diagnosis not present

## 2015-11-29 DIAGNOSIS — F209 Schizophrenia, unspecified: Secondary | ICD-10-CM | POA: Diagnosis not present

## 2015-11-29 DIAGNOSIS — F339 Major depressive disorder, recurrent, unspecified: Secondary | ICD-10-CM | POA: Diagnosis not present

## 2015-11-29 DIAGNOSIS — Z87891 Personal history of nicotine dependence: Secondary | ICD-10-CM | POA: Diagnosis not present

## 2015-11-29 DIAGNOSIS — G894 Chronic pain syndrome: Secondary | ICD-10-CM | POA: Diagnosis not present

## 2015-11-29 DIAGNOSIS — F29 Unspecified psychosis not due to a substance or known physiological condition: Secondary | ICD-10-CM | POA: Diagnosis not present

## 2015-11-29 DIAGNOSIS — I10 Essential (primary) hypertension: Secondary | ICD-10-CM | POA: Diagnosis not present

## 2015-11-29 DIAGNOSIS — M4624 Osteomyelitis of vertebra, thoracic region: Secondary | ICD-10-CM | POA: Diagnosis not present

## 2015-11-29 DIAGNOSIS — K5000 Crohn's disease of small intestine without complications: Secondary | ICD-10-CM | POA: Diagnosis not present

## 2015-11-29 DIAGNOSIS — M545 Low back pain: Secondary | ICD-10-CM | POA: Diagnosis not present

## 2015-11-30 DIAGNOSIS — G894 Chronic pain syndrome: Secondary | ICD-10-CM | POA: Diagnosis not present

## 2015-11-30 DIAGNOSIS — M4624 Osteomyelitis of vertebra, thoracic region: Secondary | ICD-10-CM | POA: Diagnosis not present

## 2015-11-30 DIAGNOSIS — F339 Major depressive disorder, recurrent, unspecified: Secondary | ICD-10-CM | POA: Diagnosis not present

## 2015-11-30 DIAGNOSIS — F1110 Opioid abuse, uncomplicated: Secondary | ICD-10-CM | POA: Diagnosis not present

## 2015-11-30 DIAGNOSIS — I10 Essential (primary) hypertension: Secondary | ICD-10-CM | POA: Diagnosis not present

## 2015-11-30 DIAGNOSIS — M545 Low back pain: Secondary | ICD-10-CM | POA: Diagnosis not present

## 2015-11-30 DIAGNOSIS — G061 Intraspinal abscess and granuloma: Secondary | ICD-10-CM | POA: Diagnosis not present

## 2015-11-30 DIAGNOSIS — F411 Generalized anxiety disorder: Secondary | ICD-10-CM | POA: Diagnosis not present

## 2015-11-30 DIAGNOSIS — B9562 Methicillin resistant Staphylococcus aureus infection as the cause of diseases classified elsewhere: Secondary | ICD-10-CM | POA: Diagnosis not present

## 2015-11-30 DIAGNOSIS — Z87891 Personal history of nicotine dependence: Secondary | ICD-10-CM | POA: Diagnosis not present

## 2015-11-30 DIAGNOSIS — F29 Unspecified psychosis not due to a substance or known physiological condition: Secondary | ICD-10-CM | POA: Diagnosis not present

## 2015-11-30 DIAGNOSIS — F209 Schizophrenia, unspecified: Secondary | ICD-10-CM | POA: Diagnosis not present

## 2015-11-30 DIAGNOSIS — K5000 Crohn's disease of small intestine without complications: Secondary | ICD-10-CM | POA: Diagnosis not present

## 2015-12-05 DIAGNOSIS — G894 Chronic pain syndrome: Secondary | ICD-10-CM | POA: Diagnosis not present

## 2015-12-05 DIAGNOSIS — M545 Low back pain: Secondary | ICD-10-CM | POA: Diagnosis not present

## 2015-12-05 DIAGNOSIS — F29 Unspecified psychosis not due to a substance or known physiological condition: Secondary | ICD-10-CM | POA: Diagnosis not present

## 2015-12-05 DIAGNOSIS — Z87891 Personal history of nicotine dependence: Secondary | ICD-10-CM | POA: Diagnosis not present

## 2015-12-05 DIAGNOSIS — F1110 Opioid abuse, uncomplicated: Secondary | ICD-10-CM | POA: Diagnosis not present

## 2015-12-05 DIAGNOSIS — G061 Intraspinal abscess and granuloma: Secondary | ICD-10-CM | POA: Diagnosis not present

## 2015-12-05 DIAGNOSIS — I10 Essential (primary) hypertension: Secondary | ICD-10-CM | POA: Diagnosis not present

## 2015-12-05 DIAGNOSIS — K5000 Crohn's disease of small intestine without complications: Secondary | ICD-10-CM | POA: Diagnosis not present

## 2015-12-05 DIAGNOSIS — B9562 Methicillin resistant Staphylococcus aureus infection as the cause of diseases classified elsewhere: Secondary | ICD-10-CM | POA: Diagnosis not present

## 2015-12-05 DIAGNOSIS — F209 Schizophrenia, unspecified: Secondary | ICD-10-CM | POA: Diagnosis not present

## 2015-12-05 DIAGNOSIS — F339 Major depressive disorder, recurrent, unspecified: Secondary | ICD-10-CM | POA: Diagnosis not present

## 2015-12-05 DIAGNOSIS — F411 Generalized anxiety disorder: Secondary | ICD-10-CM | POA: Diagnosis not present

## 2015-12-05 DIAGNOSIS — M4624 Osteomyelitis of vertebra, thoracic region: Secondary | ICD-10-CM | POA: Diagnosis not present

## 2015-12-06 DIAGNOSIS — F29 Unspecified psychosis not due to a substance or known physiological condition: Secondary | ICD-10-CM | POA: Diagnosis not present

## 2015-12-06 DIAGNOSIS — B9562 Methicillin resistant Staphylococcus aureus infection as the cause of diseases classified elsewhere: Secondary | ICD-10-CM | POA: Diagnosis not present

## 2015-12-06 DIAGNOSIS — M4624 Osteomyelitis of vertebra, thoracic region: Secondary | ICD-10-CM | POA: Diagnosis not present

## 2015-12-06 DIAGNOSIS — F339 Major depressive disorder, recurrent, unspecified: Secondary | ICD-10-CM | POA: Diagnosis not present

## 2015-12-06 DIAGNOSIS — Z87891 Personal history of nicotine dependence: Secondary | ICD-10-CM | POA: Diagnosis not present

## 2015-12-06 DIAGNOSIS — G894 Chronic pain syndrome: Secondary | ICD-10-CM | POA: Diagnosis not present

## 2015-12-06 DIAGNOSIS — G061 Intraspinal abscess and granuloma: Secondary | ICD-10-CM | POA: Diagnosis not present

## 2015-12-06 DIAGNOSIS — M545 Low back pain: Secondary | ICD-10-CM | POA: Diagnosis not present

## 2015-12-06 DIAGNOSIS — K5000 Crohn's disease of small intestine without complications: Secondary | ICD-10-CM | POA: Diagnosis not present

## 2015-12-06 DIAGNOSIS — I10 Essential (primary) hypertension: Secondary | ICD-10-CM | POA: Diagnosis not present

## 2015-12-06 DIAGNOSIS — F209 Schizophrenia, unspecified: Secondary | ICD-10-CM | POA: Diagnosis not present

## 2015-12-06 DIAGNOSIS — F411 Generalized anxiety disorder: Secondary | ICD-10-CM | POA: Diagnosis not present

## 2015-12-06 DIAGNOSIS — F1110 Opioid abuse, uncomplicated: Secondary | ICD-10-CM | POA: Diagnosis not present

## 2015-12-07 DIAGNOSIS — I10 Essential (primary) hypertension: Secondary | ICD-10-CM | POA: Diagnosis not present

## 2015-12-07 DIAGNOSIS — B9562 Methicillin resistant Staphylococcus aureus infection as the cause of diseases classified elsewhere: Secondary | ICD-10-CM | POA: Diagnosis not present

## 2015-12-07 DIAGNOSIS — G061 Intraspinal abscess and granuloma: Secondary | ICD-10-CM | POA: Diagnosis not present

## 2015-12-07 DIAGNOSIS — F29 Unspecified psychosis not due to a substance or known physiological condition: Secondary | ICD-10-CM | POA: Diagnosis not present

## 2015-12-07 DIAGNOSIS — F339 Major depressive disorder, recurrent, unspecified: Secondary | ICD-10-CM | POA: Diagnosis not present

## 2015-12-07 DIAGNOSIS — G894 Chronic pain syndrome: Secondary | ICD-10-CM | POA: Diagnosis not present

## 2015-12-07 DIAGNOSIS — F411 Generalized anxiety disorder: Secondary | ICD-10-CM | POA: Diagnosis not present

## 2015-12-07 DIAGNOSIS — K5000 Crohn's disease of small intestine without complications: Secondary | ICD-10-CM | POA: Diagnosis not present

## 2015-12-07 DIAGNOSIS — Z87891 Personal history of nicotine dependence: Secondary | ICD-10-CM | POA: Diagnosis not present

## 2015-12-07 DIAGNOSIS — F1110 Opioid abuse, uncomplicated: Secondary | ICD-10-CM | POA: Diagnosis not present

## 2015-12-07 DIAGNOSIS — M545 Low back pain: Secondary | ICD-10-CM | POA: Diagnosis not present

## 2015-12-07 DIAGNOSIS — M4624 Osteomyelitis of vertebra, thoracic region: Secondary | ICD-10-CM | POA: Diagnosis not present

## 2015-12-07 DIAGNOSIS — F209 Schizophrenia, unspecified: Secondary | ICD-10-CM | POA: Diagnosis not present

## 2015-12-13 DIAGNOSIS — F339 Major depressive disorder, recurrent, unspecified: Secondary | ICD-10-CM | POA: Diagnosis not present

## 2015-12-13 DIAGNOSIS — Z79899 Other long term (current) drug therapy: Secondary | ICD-10-CM | POA: Diagnosis not present

## 2015-12-13 DIAGNOSIS — F1110 Opioid abuse, uncomplicated: Secondary | ICD-10-CM | POA: Diagnosis not present

## 2015-12-13 DIAGNOSIS — M545 Low back pain: Secondary | ICD-10-CM | POA: Diagnosis not present

## 2015-12-13 DIAGNOSIS — K5000 Crohn's disease of small intestine without complications: Secondary | ICD-10-CM | POA: Diagnosis not present

## 2015-12-13 DIAGNOSIS — F209 Schizophrenia, unspecified: Secondary | ICD-10-CM | POA: Diagnosis not present

## 2015-12-13 DIAGNOSIS — F411 Generalized anxiety disorder: Secondary | ICD-10-CM | POA: Diagnosis not present

## 2015-12-13 DIAGNOSIS — I10 Essential (primary) hypertension: Secondary | ICD-10-CM | POA: Diagnosis not present

## 2015-12-13 DIAGNOSIS — M4624 Osteomyelitis of vertebra, thoracic region: Secondary | ICD-10-CM | POA: Diagnosis not present

## 2015-12-13 DIAGNOSIS — B9562 Methicillin resistant Staphylococcus aureus infection as the cause of diseases classified elsewhere: Secondary | ICD-10-CM | POA: Diagnosis not present

## 2015-12-13 DIAGNOSIS — G061 Intraspinal abscess and granuloma: Secondary | ICD-10-CM | POA: Diagnosis not present

## 2015-12-13 DIAGNOSIS — G894 Chronic pain syndrome: Secondary | ICD-10-CM | POA: Diagnosis not present

## 2015-12-13 DIAGNOSIS — F29 Unspecified psychosis not due to a substance or known physiological condition: Secondary | ICD-10-CM | POA: Diagnosis not present

## 2015-12-13 DIAGNOSIS — Z87891 Personal history of nicotine dependence: Secondary | ICD-10-CM | POA: Diagnosis not present

## 2015-12-14 DIAGNOSIS — Z87891 Personal history of nicotine dependence: Secondary | ICD-10-CM | POA: Diagnosis not present

## 2015-12-14 DIAGNOSIS — F29 Unspecified psychosis not due to a substance or known physiological condition: Secondary | ICD-10-CM | POA: Diagnosis not present

## 2015-12-14 DIAGNOSIS — K5000 Crohn's disease of small intestine without complications: Secondary | ICD-10-CM | POA: Diagnosis not present

## 2015-12-14 DIAGNOSIS — G061 Intraspinal abscess and granuloma: Secondary | ICD-10-CM | POA: Diagnosis not present

## 2015-12-14 DIAGNOSIS — F209 Schizophrenia, unspecified: Secondary | ICD-10-CM | POA: Diagnosis not present

## 2015-12-14 DIAGNOSIS — I10 Essential (primary) hypertension: Secondary | ICD-10-CM | POA: Diagnosis not present

## 2015-12-14 DIAGNOSIS — F411 Generalized anxiety disorder: Secondary | ICD-10-CM | POA: Diagnosis not present

## 2015-12-14 DIAGNOSIS — F339 Major depressive disorder, recurrent, unspecified: Secondary | ICD-10-CM | POA: Diagnosis not present

## 2015-12-14 DIAGNOSIS — B9562 Methicillin resistant Staphylococcus aureus infection as the cause of diseases classified elsewhere: Secondary | ICD-10-CM | POA: Diagnosis not present

## 2015-12-14 DIAGNOSIS — M4624 Osteomyelitis of vertebra, thoracic region: Secondary | ICD-10-CM | POA: Diagnosis not present

## 2015-12-14 DIAGNOSIS — G894 Chronic pain syndrome: Secondary | ICD-10-CM | POA: Diagnosis not present

## 2015-12-14 DIAGNOSIS — M545 Low back pain: Secondary | ICD-10-CM | POA: Diagnosis not present

## 2015-12-14 DIAGNOSIS — F1110 Opioid abuse, uncomplicated: Secondary | ICD-10-CM | POA: Diagnosis not present

## 2015-12-20 DIAGNOSIS — I10 Essential (primary) hypertension: Secondary | ICD-10-CM | POA: Diagnosis not present

## 2015-12-20 DIAGNOSIS — F209 Schizophrenia, unspecified: Secondary | ICD-10-CM | POA: Diagnosis not present

## 2015-12-20 DIAGNOSIS — F1110 Opioid abuse, uncomplicated: Secondary | ICD-10-CM | POA: Diagnosis not present

## 2015-12-20 DIAGNOSIS — B9562 Methicillin resistant Staphylococcus aureus infection as the cause of diseases classified elsewhere: Secondary | ICD-10-CM | POA: Diagnosis not present

## 2015-12-20 DIAGNOSIS — M545 Low back pain: Secondary | ICD-10-CM | POA: Diagnosis not present

## 2015-12-20 DIAGNOSIS — Z87891 Personal history of nicotine dependence: Secondary | ICD-10-CM | POA: Diagnosis not present

## 2015-12-20 DIAGNOSIS — G894 Chronic pain syndrome: Secondary | ICD-10-CM | POA: Diagnosis not present

## 2015-12-20 DIAGNOSIS — M4624 Osteomyelitis of vertebra, thoracic region: Secondary | ICD-10-CM | POA: Diagnosis not present

## 2015-12-20 DIAGNOSIS — F29 Unspecified psychosis not due to a substance or known physiological condition: Secondary | ICD-10-CM | POA: Diagnosis not present

## 2015-12-20 DIAGNOSIS — K5000 Crohn's disease of small intestine without complications: Secondary | ICD-10-CM | POA: Diagnosis not present

## 2015-12-20 DIAGNOSIS — G061 Intraspinal abscess and granuloma: Secondary | ICD-10-CM | POA: Diagnosis not present

## 2015-12-20 DIAGNOSIS — F339 Major depressive disorder, recurrent, unspecified: Secondary | ICD-10-CM | POA: Diagnosis not present

## 2015-12-20 DIAGNOSIS — F411 Generalized anxiety disorder: Secondary | ICD-10-CM | POA: Diagnosis not present

## 2016-01-10 DIAGNOSIS — G894 Chronic pain syndrome: Secondary | ICD-10-CM | POA: Diagnosis not present

## 2016-01-10 DIAGNOSIS — G061 Intraspinal abscess and granuloma: Secondary | ICD-10-CM | POA: Diagnosis not present

## 2016-01-10 DIAGNOSIS — K5000 Crohn's disease of small intestine without complications: Secondary | ICD-10-CM | POA: Diagnosis not present

## 2016-01-10 DIAGNOSIS — Z79899 Other long term (current) drug therapy: Secondary | ICD-10-CM | POA: Diagnosis not present

## 2016-01-11 ENCOUNTER — Ambulatory Visit: Payer: Self-pay | Admitting: Infectious Diseases

## 2016-01-11 DIAGNOSIS — M4624 Osteomyelitis of vertebra, thoracic region: Secondary | ICD-10-CM | POA: Diagnosis not present

## 2016-01-11 DIAGNOSIS — R030 Elevated blood-pressure reading, without diagnosis of hypertension: Secondary | ICD-10-CM | POA: Diagnosis not present

## 2016-01-11 DIAGNOSIS — M4802 Spinal stenosis, cervical region: Secondary | ICD-10-CM | POA: Diagnosis not present

## 2016-01-11 DIAGNOSIS — M4804 Spinal stenosis, thoracic region: Secondary | ICD-10-CM | POA: Diagnosis not present

## 2016-01-26 DIAGNOSIS — M4802 Spinal stenosis, cervical region: Secondary | ICD-10-CM | POA: Diagnosis not present

## 2016-01-26 DIAGNOSIS — M47812 Spondylosis without myelopathy or radiculopathy, cervical region: Secondary | ICD-10-CM | POA: Diagnosis not present

## 2016-02-06 ENCOUNTER — Other Ambulatory Visit: Payer: Self-pay | Admitting: Neurosurgery

## 2016-02-09 DIAGNOSIS — G061 Intraspinal abscess and granuloma: Secondary | ICD-10-CM | POA: Diagnosis not present

## 2016-02-09 DIAGNOSIS — K5000 Crohn's disease of small intestine without complications: Secondary | ICD-10-CM | POA: Diagnosis not present

## 2016-02-09 DIAGNOSIS — G894 Chronic pain syndrome: Secondary | ICD-10-CM | POA: Diagnosis not present

## 2016-02-09 DIAGNOSIS — G629 Polyneuropathy, unspecified: Secondary | ICD-10-CM | POA: Diagnosis not present

## 2016-02-09 DIAGNOSIS — K219 Gastro-esophageal reflux disease without esophagitis: Secondary | ICD-10-CM | POA: Diagnosis not present

## 2016-02-17 ENCOUNTER — Encounter (HOSPITAL_COMMUNITY)
Admission: RE | Admit: 2016-02-17 | Discharge: 2016-02-17 | Disposition: A | Discharge status: Home / Self Care | Payer: Medicare Other | Source: Ambulatory Visit | Attending: Neurosurgery | Admitting: Neurosurgery

## 2016-02-17 ENCOUNTER — Encounter (HOSPITAL_COMMUNITY): Payer: Self-pay

## 2016-02-17 DIAGNOSIS — M4802 Spinal stenosis, cervical region: Secondary | ICD-10-CM | POA: Diagnosis not present

## 2016-02-17 DIAGNOSIS — F17210 Nicotine dependence, cigarettes, uncomplicated: Secondary | ICD-10-CM | POA: Insufficient documentation

## 2016-02-17 DIAGNOSIS — Z79899 Other long term (current) drug therapy: Secondary | ICD-10-CM | POA: Diagnosis not present

## 2016-02-17 DIAGNOSIS — Z8614 Personal history of Methicillin resistant Staphylococcus aureus infection: Secondary | ICD-10-CM | POA: Diagnosis not present

## 2016-02-17 DIAGNOSIS — K219 Gastro-esophageal reflux disease without esophagitis: Secondary | ICD-10-CM | POA: Diagnosis not present

## 2016-02-17 DIAGNOSIS — F410 Panic disorder [episodic paroxysmal anxiety] without agoraphobia: Secondary | ICD-10-CM | POA: Diagnosis not present

## 2016-02-17 DIAGNOSIS — Z01812 Encounter for preprocedural laboratory examination: Secondary | ICD-10-CM | POA: Diagnosis not present

## 2016-02-17 DIAGNOSIS — F209 Schizophrenia, unspecified: Secondary | ICD-10-CM | POA: Insufficient documentation

## 2016-02-17 DIAGNOSIS — K5090 Crohn's disease, unspecified, without complications: Secondary | ICD-10-CM | POA: Insufficient documentation

## 2016-02-17 DIAGNOSIS — G4700 Insomnia, unspecified: Secondary | ICD-10-CM | POA: Insufficient documentation

## 2016-02-17 DIAGNOSIS — E785 Hyperlipidemia, unspecified: Secondary | ICD-10-CM | POA: Insufficient documentation

## 2016-02-17 DIAGNOSIS — Z01818 Encounter for other preprocedural examination: Secondary | ICD-10-CM | POA: Diagnosis not present

## 2016-02-17 HISTORY — DX: Effusion, unspecified joint: M25.40

## 2016-02-17 HISTORY — DX: Unspecified osteoarthritis, unspecified site: M19.90

## 2016-02-17 HISTORY — DX: Hyperlipidemia, unspecified: E78.5

## 2016-02-17 HISTORY — DX: Personal history of other diseases of the digestive system: Z87.19

## 2016-02-17 HISTORY — DX: Other specified behavioral and emotional disorders with onset usually occurring in childhood and adolescence: F98.8

## 2016-02-17 HISTORY — DX: Gastro-esophageal reflux disease without esophagitis: K21.9

## 2016-02-17 HISTORY — DX: Weakness: R53.1

## 2016-02-17 HISTORY — DX: Pneumonia, unspecified organism: J18.9

## 2016-02-17 HISTORY — DX: Panic disorder (episodic paroxysmal anxiety): F41.0

## 2016-02-17 HISTORY — DX: Anxiety disorder, unspecified: F41.9

## 2016-02-17 HISTORY — DX: Cardiac murmur, unspecified: R01.1

## 2016-02-17 HISTORY — DX: Cervicalgia: M54.2

## 2016-02-17 HISTORY — DX: Other seasonal allergic rhinitis: J30.2

## 2016-02-17 HISTORY — DX: Unspecified abdominal pain: R10.9

## 2016-02-17 HISTORY — DX: Other chronic pain: G89.29

## 2016-02-17 HISTORY — DX: Other muscle spasm: M62.838

## 2016-02-17 HISTORY — DX: Insomnia, unspecified: G47.00

## 2016-02-17 HISTORY — DX: Pain in unspecified joint: M25.50

## 2016-02-17 LAB — CBC
HCT: 38.8 % (ref 36.0–46.0)
Hemoglobin: 13.1 g/dL (ref 12.0–15.0)
MCH: 30.4 pg (ref 26.0–34.0)
MCHC: 33.8 g/dL (ref 30.0–36.0)
MCV: 90 fL (ref 78.0–100.0)
PLATELETS: 350 10*3/uL (ref 150–400)
RBC: 4.31 MIL/uL (ref 3.87–5.11)
RDW: 14.6 % (ref 11.5–15.5)
WBC: 8.4 10*3/uL (ref 4.0–10.5)

## 2016-02-17 LAB — BASIC METABOLIC PANEL
Anion gap: 11 (ref 5–15)
BUN: 7 mg/dL (ref 6–20)
CHLORIDE: 101 mmol/L (ref 101–111)
CO2: 26 mmol/L (ref 22–32)
CREATININE: 0.77 mg/dL (ref 0.44–1.00)
Calcium: 8.7 mg/dL — ABNORMAL LOW (ref 8.9–10.3)
GFR calc Af Amer: >60 mL/min (ref >60)
Glucose, Bld: 80 mg/dL (ref 65–99)
Potassium: 4.3 mmol/L (ref 3.5–5.1)
SODIUM: 138 mmol/L (ref 135–145)

## 2016-02-17 LAB — SURGICAL PCR SCREEN
MRSA, PCR: NEGATIVE
Staphylococcus aureus: NEGATIVE

## 2016-02-17 NOTE — Progress Notes (Signed)
Cardiologist denies   [place] Bl[place] with [facility]   Echo denies  Stress test done several yrs ago  Heart cath denies  EKG and CXR in epic from 09-05-15

## 2016-02-17 NOTE — Pre-Procedure Instructions (Signed)
Tinashe Hoss  02/17/2016     No Pharmacies Listed   Your procedure is scheduled on Fri, Mar 3 @ 1:40 PM  Report to [facility] Admitting at 11:30 AM  Call this number if you have problems the morning of surgery:  [phone]   Remember:  Do not eat food or drink liquids after midnight.  Take these medicines the morning of surgery with A SIP OF WATER Alprazolam(Xanax),Gabapentin(Neurontin),Ritalin(Methylphenidate),Metoprolol(Lopressor),Pain Pill(if needed),Nexium(Esomeprazole),and ProAir<Bring Your Inhaler With You>              No Goody's,BC's,Aleve,Aspirin,Ibuprofen,Motrin,Advil,Fish Oil,or any Herbal Medications.    Do not wear jewelry, make-up or nail polish.  Do not wear lotions, powders, or perfumes.  You may wear deodorant.  Do not shave 48 hours prior to surgery.    Do not bring valuables to the hospital.  [facility] is not responsible for any belongings or valuables.  Contacts, dentures or bridgework may not be worn into surgery.  Leave your suitcase in the car.  After surgery it may be brought to your room.  For patients admitted to the hospital, discharge time will be determined by your treatment team.  Patients discharged the day of surgery will not be allowed to drive home.    Special instructions:  [facility] - Preparing for Surgery  Before surgery, you can play an important role.  Because skin is not sterile, your skin needs to be as free of germs as possible.  You can reduce the number of germs on you skin by washing with CHG (chlorahexidine gluconate) soap before surgery.  CHG is an antiseptic cleaner which kills germs and bonds with the skin to continue killing germs even after washing.  Please DO NOT use if you have an allergy to CHG or antibacterial soaps.  If your skin becomes reddened/irritated stop using the CHG and inform your nurse when you arrive at Short Stay.  Do not shave (including legs and underarms) for at least 48 hours prior to the  first CHG shower.  You may shave your face.  Please follow these instructions carefully:   1.  Shower with CHG Soap the night before surgery and the                                morning of Surgery.  2.  If you choose to wash your hair, wash your hair first as usual with your       normal shampoo.  3.  After you shampoo, rinse your hair and body thoroughly to remove the                      Shampoo.  4.  Use CHG as you would any other liquid soap.  You can apply chg directly       to the skin and wash gently with scrungie or a clean washcloth.  5.  Apply the CHG Soap to your body ONLY FROM THE NECK DOWN.        Do not use on open wounds or open sores.  Avoid contact with your eyes,       ears, mouth and genitals (private parts).  Wash genitals (private parts)       with your normal soap.  6.  Wash thoroughly, paying special attention to the area where your surgery        will be performed.  7.  Thoroughly rinse your body with warm water from the neck down.  8.  DO NOT shower/wash with your normal soap after using and rinsing off       the CHG Soap.  9.  Pat yourself dry with a clean towel.            10.  Wear clean pajamas.            11.  Place clean sheets on your bed the night of your first shower and do not        sleep with pets.  Day of Surgery  Do not apply any lotions/deoderants the morning of surgery.  Please wear clean clothes to the hospital/surgery center.    Please read over the following fact sheets that you were given. Pain Booklet, Coughing and Deep Breathing, MRSA Information and Surgical Site Infection Prevention

## 2016-02-21 NOTE — Progress Notes (Addendum)
Anesthesia Chart Review: Patient is a 55 year old female scheduled for C5-6, C6-7 ACDF on 03/02/16 by Dr. Jordan Likes  History includes smoking, Crohn's disease, insomnia, GERD, hiatal hernia, HLD, murmur (not specified), PNA 08/2015, chronic headache, anxiety with panic attacks, Schizophrenia, ADD, cholecystectomy, hysterectomy, mandible fracture surgery, bowel resection, MRSA paraspinal abscess 09/2015 s/p IV Vancomycin then po Bactrim per ID Dr. Ninetta Lights. His note also states that she had a positive hepatitis C antibody, RNA, "She is immune."   PCP is listed as [place] Blattenberger, FNP with [facility].   Meds include Xanax, Neurontin, Lialda, Lidoderm, Ritalin, Nasonex, MS Contin, Nexium, Zyrexa, oxycodone, prednisone, Prempro, Proair, promethazine, trazodone.   09/05/15 EKG: ST at 102 bpm, right BBB, anteroseptal infarct (age undetermined). Tracing not felt significantly changed since 08/13/15 tracing. Currently, I do not have any additional EKG tracings to compare.  Preoperative labs noted.   09/05/15 Chest CTA: IMPRESSION: No CT evidence of pulmonary embolism. Bilobed paraspinal soft tissue mass centered at T3-T4 with mild destructive changes and pathologic fracture of the adjacent vertebra most compatible with neoplastic process. MRI is recommended for further characterization.  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 Paravertebral phlegmonous inflammation with early abscess formation on the right. Epidural phlegmonous inflammation with constriction of the thecal sac and slight deformity and displacement of the cord. Question early abnormal T2 signal in the cord. The possibility of tumor is considered but felt less likely. (09/07/15 CT guided aspiration showed MRSA.)   I have attempted to contact patient, but no answer at the phone number listed. This was also the only number listed at Dr.  Lindalou Hose office. Discussed what is known about patient with anesthesiologist Dr. Sandford Craze. Want to confirm with patient that she has been asymptomatic from a CV standpoint, but if so then would anticipate that she can proceed as planned. I'll continue to reach patient, but if unsuccessful then clinical correlation on the day of surgery. No CV symptoms documented from her PAT visit.  Velna Ochs [facility] Short Stay Center/Anesthesiology Phone [phone] 02/21/2016 4:46 PM  Addendum: I was able to reach patient. She is an admittedly nervous talker. She reports she will need something for her nerves prior to going in to the OR--otherwise she will likely start crying due to her nerves. She is also a hard IV stick. She lives with her son, but is home alone for most of the day while he is working. Her psychiatrist is Dr. Westley Chandler.  In regards to her cardiac history, she reports CAD/MI history on her dad's side. Her father died after being hit by a train when patient was [age] years old. She said her uncles had history of MI, but unsure about what age (she thought at least 76 or older). She denied CP currently, but said a few years ago she had PCP and ED visits for chest pain and to her understanding had a normal stress test. Symptoms resolved once they switched her to a name brand anxiolytic.  ED visit was at [facility]. She thinks any heart studies were done at [facility] in [facility]. She thinks that it was at [facility] where she was told she had a murmur, and that also she would need to stop Haldol due to effects on her heart rhythm. She may have had an echo at [facility], but she denied a cardiac cath. No history of syncope. She  does feel her heart will race when she is having an anxiety attack. She also SOB during an attack. She is down to about 3 cigarettes per day. She will try to do house work like mopping, but has to take breaks due to back pain. No significant edema, occasionally  elevates legs if needed.    Records requested from [facility]. I'll review once received.  Velna Ochs [facility] Short Stay Center/Anesthesiology Phone [phone] 02/21/2016 6:25 PM  Addendum: Records from [facility] received:  02/24/14 Echo: Normal study. Normal cardiac chamber sizes and function; LVEF 60-65%; normal valve anatomy and function; no pericardial effusion of intracardiac mass; No intracardiac shunts by 2-D and color flow imaging. Normal thoracic aorta and aortic arch.   03/11/14 Nuclear stress test: Myocardial perfusion is normal. Overall LV systolic function was normal without regional wall motion abnormalities. The LVEF was 61%.  02/24/14 Carotid U/S: Mild, < 39% LICA stenosis. Normal right and left ICA/CCA ratios Normal antegrade flow bilaterally.   Comparison EKG from 07/20/15 also showed right BBB. Limb leads look different but there appears to be limb lead reversal on that EKG. 03/11/14 tracing from her stress test showed septal Q waves, no right BBB.  She denied any recent chest pain, and had a normal stress and echo 2 years ago. If no acute changes then I anticipate that she can proceed as planned.  Velna Ochs [facility] Short Stay Center/Anesthesiology Phone [phone] 02/23/2016 9:55 AM

## 2016-02-24 ENCOUNTER — Inpatient Hospital Stay (HOSPITAL_COMMUNITY): Admission: RE | Admit: 2016-02-24 | Payer: Self-pay | Source: Ambulatory Visit

## 2016-03-02 ENCOUNTER — Inpatient Hospital Stay (HOSPITAL_COMMUNITY)
Admission: RE | Admit: 2016-03-02 | Discharge: 2016-03-05 | DRG: 472 | Disposition: A | Discharge status: Home / Self Care | Payer: Medicare Other | Source: Ambulatory Visit | Attending: Neurosurgery | Admitting: Neurosurgery

## 2016-03-02 ENCOUNTER — Inpatient Hospital Stay (HOSPITAL_COMMUNITY): Payer: Medicare Other

## 2016-03-02 ENCOUNTER — Inpatient Hospital Stay (HOSPITAL_COMMUNITY): Payer: Medicare Other | Admitting: Vascular Surgery

## 2016-03-02 ENCOUNTER — Inpatient Hospital Stay (HOSPITAL_COMMUNITY): Payer: Medicare Other | Admitting: Anesthesiology

## 2016-03-02 ENCOUNTER — Encounter (HOSPITAL_COMMUNITY)
Admission: RE | Disposition: A | Discharge status: Home / Self Care | Payer: Self-pay | Source: Ambulatory Visit | Attending: Neurosurgery

## 2016-03-02 ENCOUNTER — Encounter (HOSPITAL_COMMUNITY): Payer: Self-pay | Admitting: General Practice

## 2016-03-02 DIAGNOSIS — M542 Cervicalgia: Secondary | ICD-10-CM | POA: Diagnosis not present

## 2016-03-02 DIAGNOSIS — M50122 Cervical disc disorder at C5-C6 level with radiculopathy: Secondary | ICD-10-CM | POA: Diagnosis not present

## 2016-03-02 DIAGNOSIS — F419 Anxiety disorder, unspecified: Secondary | ICD-10-CM | POA: Diagnosis present

## 2016-03-02 DIAGNOSIS — M4722 Other spondylosis with radiculopathy, cervical region: Secondary | ICD-10-CM | POA: Diagnosis not present

## 2016-03-02 DIAGNOSIS — K219 Gastro-esophageal reflux disease without esophagitis: Secondary | ICD-10-CM | POA: Diagnosis not present

## 2016-03-02 DIAGNOSIS — E785 Hyperlipidemia, unspecified: Secondary | ICD-10-CM | POA: Diagnosis present

## 2016-03-02 DIAGNOSIS — M50022 Cervical disc disorder at C5-C6 level with myelopathy: Secondary | ICD-10-CM | POA: Diagnosis present

## 2016-03-02 DIAGNOSIS — Z79890 Hormone replacement therapy (postmenopausal): Secondary | ICD-10-CM | POA: Diagnosis not present

## 2016-03-02 DIAGNOSIS — M62838 Other muscle spasm: Secondary | ICD-10-CM | POA: Diagnosis present

## 2016-03-02 DIAGNOSIS — M545 Low back pain: Secondary | ICD-10-CM | POA: Diagnosis not present

## 2016-03-02 DIAGNOSIS — F988 Other specified behavioral and emotional disorders with onset usually occurring in childhood and adolescence: Secondary | ICD-10-CM | POA: Diagnosis present

## 2016-03-02 DIAGNOSIS — Z9049 Acquired absence of other specified parts of digestive tract: Secondary | ICD-10-CM

## 2016-03-02 DIAGNOSIS — F209 Schizophrenia, unspecified: Secondary | ICD-10-CM | POA: Diagnosis present

## 2016-03-02 DIAGNOSIS — M4802 Spinal stenosis, cervical region: Principal | ICD-10-CM | POA: Diagnosis present

## 2016-03-02 DIAGNOSIS — K5090 Crohn's disease, unspecified, without complications: Secondary | ICD-10-CM | POA: Diagnosis not present

## 2016-03-02 DIAGNOSIS — M4712 Other spondylosis with myelopathy, cervical region: Secondary | ICD-10-CM | POA: Diagnosis not present

## 2016-03-02 DIAGNOSIS — Z79899 Other long term (current) drug therapy: Secondary | ICD-10-CM

## 2016-03-02 DIAGNOSIS — K449 Diaphragmatic hernia without obstruction or gangrene: Secondary | ICD-10-CM | POA: Diagnosis not present

## 2016-03-02 DIAGNOSIS — G4700 Insomnia, unspecified: Secondary | ICD-10-CM | POA: Diagnosis present

## 2016-03-02 DIAGNOSIS — R1310 Dysphagia, unspecified: Secondary | ICD-10-CM

## 2016-03-02 DIAGNOSIS — F17210 Nicotine dependence, cigarettes, uncomplicated: Secondary | ICD-10-CM | POA: Diagnosis present

## 2016-03-02 HISTORY — DX: Unspecified chronic bronchitis: J42

## 2016-03-02 HISTORY — DX: Unspecified viral hepatitis C without hepatic coma: B19.20

## 2016-03-02 HISTORY — DX: Other chronic pain: G89.29

## 2016-03-02 HISTORY — DX: Migraine, unspecified, not intractable, without status migrainosus: G43.909

## 2016-03-02 HISTORY — PX: ANTERIOR CERVICAL DECOMP/DISCECTOMY FUSION: SHX1161

## 2016-03-02 HISTORY — DX: Low back pain: M54.5

## 2016-03-02 HISTORY — DX: Unspecified asthma, uncomplicated: J45.909

## 2016-03-02 SURGERY — ANTERIOR CERVICAL DECOMPRESSION/DISCECTOMY FUSION 2 LEVELS
Anesthesia: General | Site: Spine Cervical

## 2016-03-02 MED ORDER — ALPRAZOLAM 1 MG PO TABS: 1.0000 mg | ORAL_TABLET | Freq: Four times a day (QID) | ORAL | Status: DC

## 2016-03-02 MED ORDER — SODIUM CHLORIDE 0.9% FLUSH
3.0000 mL | Freq: Two times a day (BID) | INTRAVENOUS | Status: DC
  Administered 2016-03-02 – 2016-03-05 (×4): 3 mL via INTRAVENOUS

## 2016-03-02 MED ORDER — SODIUM CHLORIDE 0.9 % IR SOLN
Status: DC | PRN
  Administered 2016-03-02: 500 mL

## 2016-03-02 MED ORDER — PROMETHAZINE HCL 25 MG PO TABS: 25.0000 mg | ORAL_TABLET | Freq: Four times a day (QID) | ORAL | Status: DC | PRN

## 2016-03-02 MED ORDER — 0.9 % SODIUM CHLORIDE (POUR BTL) OPTIME
TOPICAL | Status: DC | PRN
  Administered 2016-03-02: 1000 mL

## 2016-03-02 MED ORDER — CYCLOBENZAPRINE HCL 10 MG PO TABS
10.0000 mg | ORAL_TABLET | Freq: Three times a day (TID) | ORAL | Status: DC | PRN
  Filled 2016-03-02: qty 1

## 2016-03-02 MED ORDER — PANTOPRAZOLE SODIUM 40 MG PO TBEC
40.0000 mg | DELAYED_RELEASE_TABLET | Freq: Every day | ORAL | Status: DC
  Administered 2016-03-04 – 2016-03-05 (×2): 40 mg via ORAL
  Filled 2016-03-02 (×2): qty 1

## 2016-03-02 MED ORDER — LACTATED RINGERS IV SOLN: INTRAVENOUS | Status: DC

## 2016-03-02 MED ORDER — CEFAZOLIN SODIUM-DEXTROSE 2-3 GM-% IV SOLR
INTRAVENOUS | Status: AC
  Administered 2016-03-02: 08:00:00
  Filled 2016-03-02: qty 50

## 2016-03-02 MED ORDER — ALBUTEROL SULFATE (2.5 MG/3ML) 0.083% IN NEBU: 2.5000 mg | INHALATION_SOLUTION | RESPIRATORY_TRACT | Status: DC | PRN

## 2016-03-02 MED ORDER — LIDOCAINE HCL (CARDIAC) 20 MG/ML IV SOLN
INTRAVENOUS | Status: AC
  Filled 2016-03-02: qty 5

## 2016-03-02 MED ORDER — ROCURONIUM BROMIDE 50 MG/5ML IV SOLN
INTRAVENOUS | Status: AC
  Filled 2016-03-02: qty 1

## 2016-03-02 MED ORDER — HYDROMORPHONE HCL 1 MG/ML IJ SOLN: 0.2500 mg | INTRAMUSCULAR | Status: DC | PRN

## 2016-03-02 MED ORDER — METHYLPHENIDATE HCL 5 MG PO TABS
20.0000 mg | ORAL_TABLET | Freq: Three times a day (TID) | ORAL | Status: DC
  Administered 2016-03-03 – 2016-03-05 (×6): 20 mg via ORAL
  Filled 2016-03-02 (×6): qty 4

## 2016-03-02 MED ORDER — NEOSTIGMINE METHYLSULFATE 10 MG/10ML IV SOLN
INTRAVENOUS | Status: AC
  Filled 2016-03-02: qty 1

## 2016-03-02 MED ORDER — PROPOFOL 10 MG/ML IV BOLUS
INTRAVENOUS | Status: AC
  Filled 2016-03-02: qty 20

## 2016-03-02 MED ORDER — THROMBIN 5000 UNITS EX SOLR
CUTANEOUS | Status: DC | PRN
  Administered 2016-03-02 (×2): 5000 [IU] via TOPICAL

## 2016-03-02 MED ORDER — FENTANYL CITRATE (PF) 250 MCG/5ML IJ SOLN
INTRAMUSCULAR | Status: AC
  Filled 2016-03-02: qty 5

## 2016-03-02 MED ORDER — GABAPENTIN 300 MG PO CAPS
300.0000 mg | ORAL_CAPSULE | Freq: Three times a day (TID) | ORAL | Status: DC
  Administered 2016-03-03 – 2016-03-05 (×6): 300 mg via ORAL
  Filled 2016-03-02 (×7): qty 1

## 2016-03-02 MED ORDER — ACETAMINOPHEN 325 MG PO TABS: 650.0000 mg | ORAL_TABLET | ORAL | Status: DC | PRN

## 2016-03-02 MED ORDER — LABETALOL HCL 5 MG/ML IV SOLN
INTRAVENOUS | Status: DC | PRN
  Administered 2016-03-02 (×3): 2.5 mg via INTRAVENOUS

## 2016-03-02 MED ORDER — ALBUTEROL SULFATE HFA 108 (90 BASE) MCG/ACT IN AERS: 1.0000 | INHALATION_SPRAY | RESPIRATORY_TRACT | Status: DC | PRN

## 2016-03-02 MED ORDER — ONDANSETRON HCL 4 MG/2ML IJ SOLN: 4.0000 mg | INTRAMUSCULAR | Status: DC | PRN

## 2016-03-02 MED ORDER — SUCCINYLCHOLINE CHLORIDE 20 MG/ML IJ SOLN
INTRAMUSCULAR | Status: DC | PRN
  Administered 2016-03-02: 120 mg via INTRAVENOUS

## 2016-03-02 MED ORDER — MEPERIDINE HCL 25 MG/ML IJ SOLN: 6.2500 mg | INTRAMUSCULAR | Status: DC | PRN

## 2016-03-02 MED ORDER — LACTATED RINGERS IV SOLN
INTRAVENOUS | Status: DC
  Administered 2016-03-02 (×2): via INTRAVENOUS

## 2016-03-02 MED ORDER — ALPRAZOLAM 0.5 MG PO TABS
1.0000 mg | ORAL_TABLET | Freq: Four times a day (QID) | ORAL | Status: DC
  Administered 2016-03-02 – 2016-03-05 (×9): 1 mg via ORAL
  Filled 2016-03-02 (×9): qty 2

## 2016-03-02 MED ORDER — LIDOCAINE HCL (CARDIAC) 20 MG/ML IV SOLN
INTRAVENOUS | Status: DC | PRN
  Administered 2016-03-02: 60 mg via INTRAVENOUS

## 2016-03-02 MED ORDER — MIDAZOLAM HCL 2 MG/2ML IJ SOLN
INTRAMUSCULAR | Status: AC
Start: 2016-03-02 — End: 2016-03-02
  Filled 2016-03-02: qty 2

## 2016-03-02 MED ORDER — MENTHOL 3 MG MT LOZG
1.0000 | LOZENGE | OROMUCOSAL | Status: DC | PRN
  Filled 2016-03-02: qty 9

## 2016-03-02 MED ORDER — TRAZODONE HCL 100 MG PO TABS
100.0000 mg | ORAL_TABLET | Freq: Two times a day (BID) | ORAL | Status: DC
  Administered 2016-03-03 – 2016-03-04 (×3): 100 mg via ORAL
  Filled 2016-03-02 (×4): qty 1

## 2016-03-02 MED ORDER — FLUTICASONE PROPIONATE 50 MCG/ACT NA SUSP
2.0000 | Freq: Every day | NASAL | Status: DC
  Administered 2016-03-04 – 2016-03-05 (×2): 2 via NASAL
  Filled 2016-03-02: qty 16

## 2016-03-02 MED ORDER — OXYCODONE HCL 10 MG PO TABS: 10.0000 mg | ORAL_TABLET | ORAL | Status: DC

## 2016-03-02 MED ORDER — MORPHINE SULFATE ER 30 MG PO TBCR
30.0000 mg | EXTENDED_RELEASE_TABLET | Freq: Two times a day (BID) | ORAL | Status: DC
  Administered 2016-03-03 – 2016-03-05 (×4): 30 mg via ORAL
  Filled 2016-03-02 (×4): qty 1

## 2016-03-02 MED ORDER — THROMBIN 5000 UNITS EX SOLR
OROMUCOSAL | Status: DC | PRN
  Administered 2016-03-02: 13:00:00 via TOPICAL

## 2016-03-02 MED ORDER — ONDANSETRON HCL 4 MG/2ML IJ SOLN
INTRAMUSCULAR | Status: AC
  Filled 2016-03-02: qty 2

## 2016-03-02 MED ORDER — HYDROCODONE-ACETAMINOPHEN 5-325 MG PO TABS
1.0000 | ORAL_TABLET | ORAL | Status: DC | PRN
  Administered 2016-03-04: 2 via ORAL
  Filled 2016-03-02: qty 2

## 2016-03-02 MED ORDER — CEFAZOLIN SODIUM 1-5 GM-% IV SOLN
1.0000 g | Freq: Three times a day (TID) | INTRAVENOUS | Status: AC
  Administered 2016-03-02 – 2016-03-03 (×2): 1 g via INTRAVENOUS
  Filled 2016-03-02 (×2): qty 50

## 2016-03-02 MED ORDER — SODIUM CHLORIDE 0.9 % IV SOLN
250.0000 mL | INTRAVENOUS | Status: DC
  Administered 2016-03-03: 250 mL via INTRAVENOUS

## 2016-03-02 MED ORDER — HYDROMORPHONE HCL 1 MG/ML IJ SOLN
0.5000 mg | INTRAMUSCULAR | Status: DC | PRN
  Administered 2016-03-02 – 2016-03-03 (×2): 1 mg via INTRAVENOUS
  Administered 2016-03-03: 0.5 mg via INTRAVENOUS
  Administered 2016-03-03 – 2016-03-05 (×14): 1 mg via INTRAVENOUS
  Filled 2016-03-02 (×18): qty 1

## 2016-03-02 MED ORDER — OLANZAPINE 10 MG PO TABS
10.0000 mg | ORAL_TABLET | Freq: Every day | ORAL | Status: DC
  Administered 2016-03-04: 10 mg via ORAL
  Filled 2016-03-02 (×4): qty 1

## 2016-03-02 MED ORDER — MIDAZOLAM HCL 5 MG/5ML IJ SOLN
INTRAMUSCULAR | Status: DC | PRN
Start: 2016-03-02 — End: 2016-03-02
  Administered 2016-03-02: 2 mg via INTRAVENOUS

## 2016-03-02 MED ORDER — HEMOSTATIC AGENTS (NO CHARGE) OPTIME
TOPICAL | Status: DC | PRN
  Administered 2016-03-02: 1 via TOPICAL

## 2016-03-02 MED ORDER — LABETALOL HCL 5 MG/ML IV SOLN
INTRAVENOUS | Status: AC
  Filled 2016-03-02: qty 4

## 2016-03-02 MED ORDER — GLYCOPYRROLATE 0.2 MG/ML IJ SOLN
INTRAMUSCULAR | Status: DC | PRN
  Administered 2016-03-02: 0.4 mg via INTRAVENOUS

## 2016-03-02 MED ORDER — PHENOL 1.4 % MT LIQD
1.0000 | OROMUCOSAL | Status: DC | PRN
  Administered 2016-03-03: 1 via OROMUCOSAL
  Filled 2016-03-02: qty 177

## 2016-03-02 MED ORDER — ACETAMINOPHEN 650 MG RE SUPP
650.0000 mg | RECTAL | Status: DC | PRN
  Administered 2016-03-04: 650 mg via RECTAL
  Filled 2016-03-02: qty 1

## 2016-03-02 MED ORDER — CEFAZOLIN SODIUM-DEXTROSE 2-3 GM-% IV SOLR
2.0000 g | INTRAVENOUS | Status: AC
  Administered 2016-03-02: 2 g via INTRAVENOUS

## 2016-03-02 MED ORDER — OXYCODONE HCL 5 MG PO TABS
5.0000 mg | ORAL_TABLET | ORAL | Status: DC
  Administered 2016-03-02 – 2016-03-05 (×13): 5 mg via ORAL
  Filled 2016-03-02 (×14): qty 1

## 2016-03-02 MED ORDER — SODIUM CHLORIDE 0.9% FLUSH: 3.0000 mL | INTRAVENOUS | Status: DC | PRN

## 2016-03-02 MED ORDER — LIDOCAINE 5 % EX PTCH
1.0000 | MEDICATED_PATCH | Freq: Two times a day (BID) | CUTANEOUS | Status: DC
  Administered 2016-03-03 – 2016-03-05 (×5): 1 via TRANSDERMAL
  Filled 2016-03-02 (×5): qty 1

## 2016-03-02 MED ORDER — NEOSTIGMINE METHYLSULFATE 10 MG/10ML IV SOLN
INTRAVENOUS | Status: DC | PRN
  Administered 2016-03-02: 3 mg via INTRAVENOUS

## 2016-03-02 MED ORDER — OXYCODONE-ACETAMINOPHEN 5-325 MG PO TABS: 1.0000 | ORAL_TABLET | ORAL | Status: DC | PRN

## 2016-03-02 MED ORDER — CONJ ESTROG-MEDROXYPROGEST ACE 0.625-2.5 MG PO TABS: 1.0000 | ORAL_TABLET | Freq: Every day | ORAL | Status: DC

## 2016-03-02 MED ORDER — FENTANYL CITRATE (PF) 100 MCG/2ML IJ SOLN
INTRAMUSCULAR | Status: DC | PRN
  Administered 2016-03-02: 50 ug via INTRAVENOUS
  Administered 2016-03-02 (×3): 100 ug via INTRAVENOUS

## 2016-03-02 MED ORDER — ROCURONIUM BROMIDE 100 MG/10ML IV SOLN
INTRAVENOUS | Status: DC | PRN
  Administered 2016-03-02: 40 mg via INTRAVENOUS

## 2016-03-02 MED ORDER — PREDNISONE 20 MG PO TABS: 20.0000 mg | ORAL_TABLET | Freq: Every day | ORAL | Status: DC

## 2016-03-02 MED ORDER — PROPOFOL 10 MG/ML IV BOLUS
INTRAVENOUS | Status: DC | PRN
  Administered 2016-03-02: 150 mg via INTRAVENOUS

## 2016-03-02 MED ORDER — MESALAMINE 1.2 G PO TBEC
1.2000 g | DELAYED_RELEASE_TABLET | Freq: Two times a day (BID) | ORAL | Status: DC
  Administered 2016-03-04 – 2016-03-05 (×3): 1.2 g via ORAL
  Filled 2016-03-02 (×9): qty 1

## 2016-03-02 MED ORDER — PROMETHAZINE HCL 25 MG/ML IJ SOLN: 6.2500 mg | INTRAMUSCULAR | Status: DC | PRN

## 2016-03-02 SURGICAL SUPPLY — 60 items
APL SKNCLS STERI-STRIP NONHPOA (GAUZE/BANDAGES/DRESSINGS) ×1
BAG DECANTER FOR FLEXI CONT (MISCELLANEOUS) ×3 IMPLANT
BENZOIN TINCTURE PRP APPL 2/3 (GAUZE/BANDAGES/DRESSINGS) ×3 IMPLANT
BIT DRILL 13 (BIT) ×1 IMPLANT
BIT DRILL 13MM (BIT) ×1
BRUSH SCRUB EZ PLAIN DRY (MISCELLANEOUS) ×3 IMPLANT
BUR MATCHSTICK NEURO 3.0 LAGG (BURR) ×3 IMPLANT
CAGE PEEK 8X14X11 (Cage) ×2 IMPLANT
CANISTER SUCT 3000ML PPV (MISCELLANEOUS) ×3 IMPLANT
CLOSURE WOUND 1/2 X4 (GAUZE/BANDAGES/DRESSINGS) ×1
DRAPE C-ARM 42X72 X-RAY (DRAPES) ×6 IMPLANT
DRAPE LAPAROTOMY 100X72 PEDS (DRAPES) ×3 IMPLANT
DRAPE MICROSCOPE LEICA (MISCELLANEOUS) ×3 IMPLANT
DRAPE POUCH INSTRU U-SHP 10X18 (DRAPES) ×3 IMPLANT
DURAPREP 6ML APPLICATOR 50/CS (WOUND CARE) ×3 IMPLANT
ELECT COATED BLADE 2.86 ST (ELECTRODE) ×3 IMPLANT
ELECT REM PT RETURN 9FT ADLT (ELECTROSURGICAL) ×3
ELECTRODE REM PT RTRN 9FT ADLT (ELECTROSURGICAL) ×1 IMPLANT
GAUZE SPONGE 4X4 12PLY STRL (GAUZE/BANDAGES/DRESSINGS) ×3 IMPLANT
GAUZE SPONGE 4X4 16PLY XRAY LF (GAUZE/BANDAGES/DRESSINGS) ×2 IMPLANT
GLOVE BIOGEL PI IND STRL 7.0 (GLOVE) IMPLANT
GLOVE BIOGEL PI IND STRL 7.5 (GLOVE) IMPLANT
GLOVE BIOGEL PI INDICATOR 7.0 (GLOVE) ×6
GLOVE BIOGEL PI INDICATOR 7.5 (GLOVE) ×2
GLOVE ECLIPSE 9.0 STRL (GLOVE) ×3 IMPLANT
GLOVE EXAM NITRILE LRG STRL (GLOVE) IMPLANT
GLOVE EXAM NITRILE MD LF STRL (GLOVE) IMPLANT
GLOVE EXAM NITRILE XL STR (GLOVE) IMPLANT
GLOVE EXAM NITRILE XS STR PU (GLOVE) IMPLANT
GOWN STRL REUS W/ TWL LRG LVL3 (GOWN DISPOSABLE) IMPLANT
GOWN STRL REUS W/ TWL XL LVL3 (GOWN DISPOSABLE) ×1 IMPLANT
GOWN STRL REUS W/TWL 2XL LVL3 (GOWN DISPOSABLE) IMPLANT
GOWN STRL REUS W/TWL LRG LVL3 (GOWN DISPOSABLE) ×3
GOWN STRL REUS W/TWL XL LVL3 (GOWN DISPOSABLE) ×3
HALTER HD/CHIN CERV TRACTION D (MISCELLANEOUS) ×3 IMPLANT
HEMOSTAT POWDER KIT SURGIFOAM (HEMOSTASIS) ×2 IMPLANT
HEMOSTAT SURGICEL 2X14 (HEMOSTASIS) IMPLANT
KIT BASIN OR (CUSTOM PROCEDURE TRAY) ×3 IMPLANT
KIT ROOM TURNOVER OR (KITS) ×3 IMPLANT
NDL SPNL 20GX3.5 QUINCKE YW (NEEDLE) ×1 IMPLANT
NEEDLE SPNL 20GX3.5 QUINCKE YW (NEEDLE) ×3 IMPLANT
NS IRRIG 1000ML POUR BTL (IV SOLUTION) ×3 IMPLANT
PACK LAMINECTOMY NEURO (CUSTOM PROCEDURE TRAY) ×3 IMPLANT
PAD ARMBOARD 7.5X6 YLW CONV (MISCELLANEOUS) ×9 IMPLANT
PEEK CAGE 7X14X11 (Cage) ×2 IMPLANT
PLATE ELITE 42MM (Plate) ×2 IMPLANT
RUBBERBAND STERILE (MISCELLANEOUS) ×6 IMPLANT
SCREW ST 13X4XST VA NS SPNE (Screw) IMPLANT
SCREW ST VAR 4 ATL (Screw) ×18 IMPLANT
SPONGE INTESTINAL PEANUT (DISPOSABLE) ×3 IMPLANT
SPONGE SURGIFOAM ABS GEL SZ50 (HEMOSTASIS) ×3 IMPLANT
STRIP CLOSURE SKIN 1/2X4 (GAUZE/BANDAGES/DRESSINGS) ×2 IMPLANT
SUT VIC AB 3-0 SH 8-18 (SUTURE) ×3 IMPLANT
SUT VIC AB 4-0 RB1 18 (SUTURE) ×5 IMPLANT
TAPE CLOTH 4X10 WHT NS (GAUZE/BANDAGES/DRESSINGS) ×1 IMPLANT
TAPE CLOTH SURG 4X10 WHT LF (GAUZE/BANDAGES/DRESSINGS) ×2 IMPLANT
TOWEL OR 17X24 6PK STRL BLUE (TOWEL DISPOSABLE) ×3 IMPLANT
TOWEL OR 17X26 10 PK STRL BLUE (TOWEL DISPOSABLE) ×3 IMPLANT
TRAP SPECIMEN MUCOUS 40CC (MISCELLANEOUS) ×3 IMPLANT
WATER STERILE IRR 1000ML POUR (IV SOLUTION) ×3 IMPLANT

## 2016-03-02 NOTE — Anesthesia Postprocedure Evaluation (Signed)
Anesthesia Post Note  Patient: Nicole Fitzgerald  Procedure(s) Performed: Procedure(s) (LRB): Cervical Five-Six,Cervical Six-Seven Anterior cervical decompression/diskectomy/fusion (N/A)  Patient location during evaluation: PACU Anesthesia Type: General Level of consciousness: awake and alert Pain management: pain level controlled Vital Signs Assessment: post-procedure vital signs reviewed and stable Respiratory status: spontaneous breathing, nonlabored ventilation, respiratory function stable and patient connected to nasal cannula oxygen Cardiovascular status: blood pressure returned to baseline and stable Postop Assessment: no signs of nausea or vomiting Anesthetic complications: no    Last Vitals:  Filed Vitals:   03/02/16 1445 03/02/16 1500  BP: 165/93 175/92  Pulse: 53 54  Temp:    Resp: 47 35    Last Pain:  Filed Vitals:   03/02/16 1504  PainSc: Asleep                 Shelton Silvas

## 2016-03-02 NOTE — Progress Notes (Signed)
Pt c/o sore throat, states 10/10 pain at surgical site. Pt responds to voice, very lethargic, slurred speech, poor attention/concentration. RN returned flexiril 10 mg tab. 1 mg hydromorphone IV administered to Pt. Will continue to monitor.

## 2016-03-02 NOTE — Anesthesia Procedure Notes (Signed)
Date/Time: 03/02/2016 12:04 PM Performed by: Isidore Moos A Patient Re-evaluated:Patient Re-evaluated prior to inductionOxygen Delivery Method: Circle system utilized Preoxygenation: Pre-oxygenation with 100% oxygen Intubation Type: IV induction Ventilation: Mask ventilation without difficulty Laryngoscope Size: Glidescope and 4 Grade View: Grade I Tube type: Oral Tube size: 7.5 mm Airway Equipment and Method: Rigid stylet and Video-laryngoscopy Placement Confirmation: ETT inserted through vocal cords under direct vision Secured at: 22 cm Tube secured with: Tape Dental Injury: Teeth and Oropharynx as per pre-operative assessment

## 2016-03-02 NOTE — H&P (Addendum)
  Nicole Fitzgerald is an 55 y.o. female.   Chief Complaint: Neck pain HPI: 55 year old female with neck and bilateral upper extremity pain and paresthesias consistent with a mixed cervical radiculopathy. Workup demonstrates evidence of marked cervical disc degeneration with associated spondylosis and stenosis at C5-6 and C6-7. Patient presents now for two-level anterior cervical discectomy and fusion in hopes of improving her symptoms. Her situation is complicated by previous T3-T4 osteomyelitis discitis which is been medically treated and eradicated.  Past Medical History  Diagnosis Date  . Crohn's disease (HCC)     takes Lialda daily  . Insomnia     takes Trazodone nightly   . GERD (gastroesophageal reflux disease)     takes Nexium daily  . Hyperlipidemia     not on any meds  . Heart murmur   . Seasonal allergies   . Pneumonia 2016  . History of bronchitis   . Headache     "always"  . Weakness     numbness and tingling in both hands  . Chronic back pain     DDD  . Chronic neck pain     stenosis  . Arthritis   . Joint pain   . Joint swelling   . History of hiatal hernia   . Stomach cramps   . Muscle spasm     takes Robaxin daily  . Anxiety     takes Xanax daily  . Panic attack   . Schizophrenia (HCC)     takes Zyprexa nightly  . ADD (attention deficit disorder)     takes Ritalin daily    Past Surgical History  Procedure Laterality Date  . Cholecystectomy    . Abdominal hysterectomy    . Bowel resection    . Hand surgery Right   . Mandible fracture surgery      x 2  . Colonoscopy    . Esophagogastroduodenoscopy    . Abdominal surgery      Family History  Problem Relation Age of Onset  . Hypertension Mother   .      Social History:  reports that she has been smoking Cigarettes.  She has smoked for the past 22 years. She does not have any smokeless tobacco history on file. She reports that she does not drink alcohol or use illicit drugs.  Allergies:  Allergies   Allergen Reactions  . Aspirin Other (See Comments)    MD told not to take due to crohns    No prescriptions prior to admission    No results found for this or any previous visit (from the past 48 hour(s)). No results found.  Pertinent items noted in HPI and remainder of comprehensive ROS otherwise negative.  There were no vitals taken for this visit.  Patient is awake and alert. She is oriented and appropriate. Cranial nerve function is intact. Her motor examination reveals mild weakness of grip and intrinsic functions bilaterally. She has decreased sensation to pinprick and light touch in her C6-C7 and to a lesser degree C8 dermatomes bilaterally. Deep tendon versus are normal active. Gait is somewhat antalgic but otherwise normal. Examination head ears eyes and throat are marked. Chest and abdomen are benign. Extremities are free from injury deformity. 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 Plan C5-6, C6-7 ACDF with interbody cages and locally harvested autograft. Risks and benefits of been explained. Patient wishes to proceed.  [name] A 03/02/2016, 7:50 AM

## 2016-03-02 NOTE — Op Note (Signed)
Date of procedure: 03/02/2016  Date of dictation: Same  Service: Neurosurgery  Preoperative diagnosis: C5-6, C6-7 spondylosis with stenosis  Postoperative diagnosis: Same  Procedure Name: C5-6, C6-7 anterior cervical discectomy with interbody fusion utilizing interbody peek cages, locally harvested autograft, and anterior plate instrumentation.  Surgeon:Sherryann Frese A.Tionna Gigante, M.D.  Asst. 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 Workup demonstrates evidence of marked cervical spondylosis with stenosis and cord compression at C5-6 and C6-7. Patient presents now for two-level anterior cervical discectomy and fusion in hopes of improving her symptoms.  Operative note: After induction of anesthesia, patient positioned supine with her neck slightly extended and held in place of halter traction. Anterior cervical region prepped and draped sterilely. Incision made overlying C6. Dissection proceeds down to the level of the platysma. Platysma was then divided vertically and dissection proceeded on the medial border of the sternocleidomastoid muscle and carotid sheath. Trachea and esophagus are mobilized and retracted towards the left. Prevertebral fascia stripped within her spinal column longus: Was elevated bilaterally using large cautery. Deep self-retaining retaining retractors placed intraoperative fluoroscopy used levels were confirmed. Disc space at C5-6 and C6-7 were then incised 15 blade in a rectangular fashion. Discectomies and performed using pituitary rongeurs, Kerrison rongeurs, Karlin curettes, high-speed drill. All elements of the disc removed down to the level of the posterior annulus. Microscope then brought into field used throughout the remainder of the discectomy. Remaining aspects of annulus and osteophytes removed using  a high-speed drill down to the level of the posterior longitudinal ligament. Posterior longitudinal ligament was then elevated and resected in a piecemeal fashion. Underlying thecal sac was identified. Wide central decompression was then performed by undercutting the bodies of C5 and C6. Decompression then proceeded into each neural foramen. Wide anterior foraminotomies were then performed along the course the C6 nerve roots bilaterally. At this point a very thorough decompression been achieved. There was no evidence of injury to the thecal sac and nerve roots. Procedure was repeated at C6-7 again without complication. Wound is then irrigated MI solution. Gelfoam was placed topically for hemostasis and removed. [employer]onic anatomic peek cages were then impacted in place at both levels and recessed slightly from the intervertebral margin. 42 mm Atlantis anterior cervical plate was then placed over the C5, C6 and C7 levels. This is an attachment or fluoroscopic guidance heme 13 mm variable angle screws to reach it all 3 levels. All 6 screws given a final tightening and found to be solidly within the bone. Locking screws engaged at all levels. Final images revealed good position of the cages and the instrumentation at the proper upper level with normal alignment of the spine. Wound is then irrigated one final time. Hemostasis was assured with bipolar chart. Wounds and close in layers with Vicryl sutures. Steri-Strips and sterile dressing were applied. No apparent complications. Patient tolerated the procedure well and she returns to the recovery room postop.

## 2016-03-02 NOTE — Brief Op Note (Signed)
03/02/2016  1:29 PM  PATIENT:  Nicole Fitzgerald  55 y.o. female  PRE-OPERATIVE DIAGNOSIS:  Spinal stenosis, Cervical region  POST-OPERATIVE DIAGNOSIS:  Spinal Stenosis,Cervical region  PROCEDURE:  Procedure(s) with comments: Cervical Five-Six,Cervical Six-Seven Anterior cervical decompression/diskectomy/fusion (N/A) - right side approach  SURGEON:  Surgeon(s) and Role:    * Julio Sicks, MD - Primary  PHYSICIAN ASSISTANT:   ASSISTANTS:    ANESTHESIA:   general  EBL:  Total I/O In: 1000 [I.V.:1000] Out: -   BLOOD ADMINISTERED:none  DRAINS: none   LOCAL MEDICATIONS USED:  NONE  SPECIMEN:  No Specimen  DISPOSITION OF SPECIMEN:  N/A  COUNTS:  YES  TOURNIQUET:  * No tourniquets in log *  DICTATION: .Dragon Dictation  PLAN OF CARE: Admit to inpatient   PATIENT DISPOSITION:  PACU - hemodynamically stable.   Delay start of Pharmacological VTE agent (>24hrs) due to surgical blood loss or risk of bleeding: yes

## 2016-03-02 NOTE — Anesthesia Preprocedure Evaluation (Addendum)
Anesthesia Evaluation  Patient identified by MRN, date of birth, ID band Patient awake    Reviewed: Allergy & Precautions, NPO status , Patient's Chart, lab work & pertinent test results  Airway Mallampati: II  TM Distance: >3 FB Neck ROM: Limited    Dental  (+) Dental Advisory Given, Poor Dentition, Edentulous Upper, Edentulous Lower   Pulmonary Current Smoker,     + decreased breath sounds      Cardiovascular + dysrhythmias + Valvular Problems/Murmurs  Rhythm:Regular Rate:Normal     Neuro/Psych  Headaches, PSYCHIATRIC DISORDERS Anxiety Schizophrenia    GI/Hepatic Neg liver ROS, hiatal hernia, GERD  Medicated,  Endo/Other  negative endocrine ROS  Renal/GU negative Renal ROS  negative genitourinary   Musculoskeletal  (+) Arthritis , Osteoarthritis,    Abdominal   Peds negative pediatric ROS (+)  Hematology negative hematology ROS (+)   Anesthesia Other Findings - Chronic Back Pain - HLD - ADD  Reproductive/Obstetrics negative OB ROS                          Lab Results  Component Value Date   WBC 8.4 02/17/2016   HGB 13.1 02/17/2016   HCT 38.8 02/17/2016   MCV 90.0 02/17/2016   PLT 350 02/17/2016   Lab Results  Component Value Date   CREATININE 0.77 02/17/2016   BUN 7 02/17/2016   NA 138 02/17/2016   K 4.3 02/17/2016   CL 101 02/17/2016   CO2 26 02/17/2016   Lab Results  Component Value Date   INR 1.15 09/07/2015   09/2015 EKG: sinus tachycardia.   Anesthesia Physical Anesthesia Plan  ASA: III  Anesthesia Plan: General   Post-op Pain Management:    Induction: Intravenous  Airway Management Planned: Oral ETT and Video Laryngoscope Planned  Additional Equipment:   Intra-op Plan:   Post-operative Plan: Extubation in OR  Informed Consent: I have reviewed the patients History and Physical, chart, labs and discussed the procedure including the risks, benefits and  alternatives for the proposed anesthesia with the patient or authorized representative who has indicated his/her understanding and acceptance.   Dental advisory given  Plan Discussed with: CRNA  Anesthesia Plan Comments: (Bilateral UE weakness and numbness at rest. )      Anesthesia Quick Evaluation

## 2016-03-02 NOTE — Transfer of Care (Signed)
Immediate Anesthesia Transfer of Care Note  Patient: Nicole Fitzgerald  Procedure(s) Performed: Procedure(s) with comments: Cervical Five-Six,Cervical Six-Seven Anterior cervical decompression/diskectomy/fusion (N/A) - right side approach  Patient Location: PACU  Anesthesia Type:General  Level of Consciousness: sedated  Airway & Oxygen Therapy: Patient Spontanous Breathing and Patient connected to nasal cannula oxygen  Post-op Assessment: Report given to RN  Post vital signs: Reviewed and stable  Last Vitals:  Filed Vitals:   03/02/16 1034  BP: 128/98  Pulse: 73  Temp: 36.9 C  Resp: 18    Complications: No apparent anesthesia complications

## 2016-03-03 ENCOUNTER — Inpatient Hospital Stay (HOSPITAL_COMMUNITY): Payer: Medicare Other

## 2016-03-03 LAB — GLUCOSE, CAPILLARY: GLUCOSE-CAPILLARY: 122 mg/dL — AB (ref 65–99)

## 2016-03-03 MED ORDER — POTASSIUM CHLORIDE IN NACL 20-0.9 MEQ/L-% IV SOLN
INTRAVENOUS | Status: DC
  Administered 2016-03-03 – 2016-03-04 (×2): via INTRAVENOUS
  Filled 2016-03-03 (×8): qty 1000

## 2016-03-03 MED ORDER — DEXAMETHASONE SODIUM PHOSPHATE 10 MG/ML IJ SOLN
INTRAMUSCULAR | Status: AC
  Administered 2016-03-03: 6 mg
  Filled 2016-03-03: qty 1

## 2016-03-03 MED ORDER — DEXAMETHASONE SODIUM PHOSPHATE 4 MG/ML IJ SOLN: 6.0000 mg | Freq: Once | INTRAMUSCULAR | Status: DC

## 2016-03-03 NOTE — Progress Notes (Signed)
No issues overnight. Pt continues to report significant pain, including sore throat. Able to tolerate PO.  EXAM:  BP 102/59 mmHg  Pulse 67  Temp(Src) 98.8 F (37.1 C) (Oral)  Resp 18  Ht 5\' 6"  (1.676 m)  Wt 72.831 kg (160 lb 9 oz)  BMI 25.93 kg/m2  SpO2 99%  Awake, alert, oriented  Speech fluent, appropriate  CN grossly intact  Mild grip weakness  IMPRESSION:  55 y.o. female POD#1 s/p ACDF, has chronic pain. Hesitate to increase pain medication as she appears somewhat drowsy already.  PLAN: - Cont current meds - Will get PT eval, pt hesitant to return home as she lives alone.

## 2016-03-03 NOTE — Evaluation (Signed)
Clinical/Bedside Swallow Evaluation Patient Details  Name: Nicole Fitzgerald MRN: [id number] Date of Birth: 1961-09-16  Today's Date: 03/03/2016 Time: SLP Start Time (ACUTE ONLY): 1502 SLP Stop Time (ACUTE ONLY): 1526 SLP Time Calculation (min) (ACUTE ONLY): 24 min  Past Medical History:  Past Medical History  Diagnosis Date  . Crohn's disease (HCC)     takes Lialda daily  . Insomnia     takes Trazodone nightly   . GERD (gastroesophageal reflux disease)     takes Nexium daily  . Hyperlipidemia     not on any meds  . Heart murmur   . Seasonal allergies   . Chronic bronchitis (HCC)   . Weakness     numbness and tingling in both hands  . Chronic lower back pain     DDD  . Chronic neck pain     stenosis  . Joint pain   . Joint swelling   . History of hiatal hernia   . Stomach cramps   . Muscle spasm     takes Robaxin daily  . Anxiety     takes Xanax daily  . Panic attack   . Schizophrenia (HCC)     takes Zyprexa nightly  . ADD (attention deficit disorder)     takes Ritalin daily  . Asthma   . Pneumonia 2016  . Hepatitis C     "treated and cured"  . Migraine     "~ q other day" (03/02/2016)  . Arthritis     "hands, back" (03/02/2016)   Past Surgical History:  Past Surgical History  Procedure Laterality Date  . Cholecystectomy    . Vaginal hysterectomy    . Colon resection      "took out some of my small and some of my large"  . 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 Mandible fracture surgery  x 2    "had to rebreak"  . Colonoscopy    . Esophagogastroduodenoscopy    . Umbilical hernia repair      "when they found out I had crohn's"  . Anterior cervical decomp/discectomy fusion  03/02/2016    C5-6; 6-7  . Back surgery    . Fracture surgery    . Ct guide abscess drainage  ([facility] hx)  09/07/2015    CT guided Aspiration of thoracic paraspinal abscess/notes 09/08/2015  . Appendectomy    . Excisional hemorrhoidectomy     HPI:  55 year old female with neck and  bilateral upper extremity pain and paresthesias consistent with a mixed cervical radiculopathy. Workup demonstrates evidence of marked cervical disc degeneration with associated spondylosis and stenosis at C5-6 and C6-7. Patient presents now for two-level anterior cervical discectomy and fusion in hopes of improving her symptoms. Her situation is complicated by previous T3-T4 osteomyelitis discitis which is been medically treated and eradicated.   Assessment / Plan / Recommendation Clinical Impression  Patient has moderate to severe pharyngeal dysphagia. Pt has a cervical collar in place. Upon entering room pt is asking for pain medication for sore throat and neck pain. Pt is slurring words and has a wet vocal quality. She expressed it hurts to swallow. I asked her if she wanted [place] to feed her another way (i.e. NG). She stated "I want to try to eat". I gave her one sip of thin liquids. She had an immediate cough and wet vocal quality. Her cough was not effective to clear and suspect she is unable to protect her airway at this  time. Recommend NPO status and MBS on Monday. RN notified and agreed with recommendation.     Aspiration Risk  Severe aspiration risk;Risk for inadequate nutrition/hydration    Diet Recommendation NPO;Alternative means - temporary   Medication Administration: Via alternative means    Other  Recommendations   MBS  Follow up Recommendations       Frequency and Duration            Prognosis Prognosis for Safe Diet Advancement: Good      Swallow Study   General Date of Onset: 03/03/16 HPI: 55 year old female with neck and bilateral upper extremity pain and paresthesias consistent with a mixed cervical radiculopathy. Workup demonstrates evidence of marked cervical disc degeneration with associated spondylosis and stenosis at C5-6 and C6-7. Patient presents now for two-level anterior cervical discectomy and fusion in hopes of improving her symptoms. Her situation is  complicated by previous T3-T4 osteomyelitis discitis which is been medically treated and eradicated. Type of Study: Bedside Swallow Evaluation Diet Prior to this Study: Regular;Thin liquids Temperature Spikes Noted: No Respiratory Status: Room air History of Recent Intubation: No Behavior/Cognition: Lethargic/Drowsy;Other (Comment) (slurring words) Oral Cavity Assessment: Within Functional Limits Oral Care Completed by SLP: No Oral Cavity - Dentition: Adequate natural dentition Vision: Functional for self-feeding Self-Feeding Abilities: Able to feed self Patient Positioning: Upright in bed Baseline Vocal Quality: Wet;Low vocal intensity Volitional Cough: Weak;Wet Volitional Swallow: Able to elicit    Oral/Motor/Sensory Function Overall Oral Motor/Sensory Function: Within functional limits   Ice Chips Ice chips: Not tested   Thin Liquid Thin Liquid: Impaired Presentation: Cup Oral Phase Impairments: Poor awareness of bolus Oral Phase Functional Implications: Prolonged oral transit Pharyngeal  Phase Impairments: Suspected delayed Swallow;Decreased hyoid-laryngeal movement;Wet Vocal Quality;Throat Clearing - Immediate;Cough - Immediate    Nectar Thick     Honey Thick     Puree     Solid   GO        Functional Limitations: Swallowing Swallow Current Status (G9562): 100 percent impaired, limited or restricted Swallow Goal Status (Z3086): At least 40 percent but less than 60 percent impaired, limited or restricted   Lindalou Hose Kairi Harshbarger 03/03/2016,3:27 PM

## 2016-03-03 NOTE — Progress Notes (Signed)
Radiology transporters,Taja and Darin Engels, given verbal report of Pt NPO status, aspiration risk, IV in right foot. Pt informed due to NPO status pain medication ordered is dilaudid which cannot be given again until 2058. RN will inquire on possibly additional IV pain meds pending cxr and ultrasound results

## 2016-03-03 NOTE — Progress Notes (Signed)
Patient stated to RN that she is upset because she had two meal trays delivered to room, that she was not offered to eat either. RN attempted to set patient up to eat breakfast, patient asked to go back to sleep. Nurse tech attempted to sit patient up for meal, patient not alert enough to eat. RN later offered ensure supplement. Patient's friend attempted to feed patient lunch, patient stated that food was too cold and she was unable to eat the lunch tray delivered. Food was warm. RN asked if patient would like food reheated, patient refused. RN offered ensure supplement, patient drank full ensure supplement at lunch time. RN then noted gurgling, that patient was unable to effectively clear secretions. Patient was made NPO per SLP recommendation. Patient states she is upset now because she cannot have anything else to eat until barium swallow test. Patient educated about why she is NPO. Patient stated she is still mad that she is NPO. Will continue to monitor closely.

## 2016-03-03 NOTE — Progress Notes (Signed)
Per radiology request RN assisted in positioning Pt for images. Soft tissue image and CXR obtained.Pt transported back to Pt room by bed. Pt c/o of hunger and 9/10 pain at incision site, throat, and head. Pt repostitioned, states she is as comfortable as she's going to be. RN reeducated and reassured Pt that once all images, test and MD orders are obtained that she can safely have anything by mouth she will be provided with eat and drink.

## 2016-03-03 NOTE — Evaluation (Addendum)
Physical Therapy Evaluation Patient Details Name: Nicole Fitzgerald MRN: [id number] DOB: [birth year] Today's Date: 03/03/2016   History of Present Illness  55 yo admitted for C5-7 ACDF. 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 Pt with IV in right foot and would not bear significant or consistent weight through RLE and was unable to ambulate due to this. Pt with decreased strength, function, transfers, mobility and independence who demonstrates impulsivity and anxiety with mobility not safe to move even for basic transfers on her own. She will benefit from acute therapy to maximize mobility, gait and independence to decrease burden of care but pt does not have caregivers during the day and given current function and lethargic state recommend ST-SNF. Pt educated for cervical precautions with handout but no carry over demonstrated and unable to state education end of session.     Follow Up Recommendations SNF;Supervision/Assistance - 24 hour    Equipment Recommendations  3in1 (PT)    Recommendations for Other Services OT consult     Precautions / Restrictions Precautions Precautions: Fall;Cervical Required Braces or Orthoses: Cervical Brace Cervical Brace: Hard collar;At all times      Mobility  Bed Mobility Overal bed mobility: Needs Assistance Bed Mobility: Sit to Sidelying         Sit to sidelying: Min guard General bed mobility comments: max cues as pt impulsive and at risk for pulling lines and not following precautions without hands on guarding and cueing  Transfers Overall transfer level: Needs assistance   Transfers: Sit to/from Stand;Stand Pivot Transfers Sit to Stand: Min assist Stand pivot transfers: Mod assist       General transfer comment: pt with cues for hand placement and safety. pt impulsive with  transfers not following commands consistently and sitting prior to backing to [facility] and bed. Pt stood from recliner took 3-4 hopping steps to [facility] and same 4-5 hopping steps to pivot to bed. Pt with IV in right foot stating it hurts to much to put weight through foot. However pt also pushing RW away and not safe with any mobility.   Ambulation/Gait Ambulation/Gait assistance: Mod assist Ambulation Distance (Feet): 4 Feet Assistive device: Rolling walker (2 wheeled)       General Gait Details: see transfers  Stairs            Wheelchair Mobility    Modified Rankin (Stroke Patients Only)       Balance Overall balance assessment: Needs assistance   Sitting balance-Leahy Scale: Fair       Standing balance-Leahy Scale: Poor                               Pertinent Vitals/Pain Pain Assessment: 0-10 Pain Score: 8  Pain Location: neck and bil UE Pain Descriptors / Indicators: Aching Pain Intervention(s): Limited activity within patient's tolerance;Repositioned;Premedicated before session    Home Living Family/patient expects to be discharged to:: Private residence Living Arrangements: Children;Other relatives Available Help at Discharge: Family;Available PRN/intermittently Type of Home: House Home Access: Stairs to enter Entrance Stairs-Rails: Right Entrance Stairs-Number of Steps: 5 Home Layout: Two level;Able to live on main level with bedroom/bathroom Home Equipment: Walker - 4 wheels;Shower seat      Prior Function Level of Independence: Independent with assistive device(s)         Comments:  pt states she does ADLs independently and she and family share housework     Hand Dominance        Extremity/Trunk Assessment   Upper Extremity Assessment: Generalized weakness;RUE deficits/detail;LUE deficits/detail RUE Deficits / Details: pt with strength grossly 2+/5 with pt reporting decreased sensation bil UE, grip strength 2/5     LUE Deficits /  Details: pt with strength grossly 2+/5 with pt reporting decreased sensation bil UE, grip strength 2/5   Lower Extremity Assessment: Generalized weakness         Communication   Communication: Other (comment) (pt slurring speech throughout)  Cognition Arousal/Alertness: Lethargic Behavior During Therapy: Anxious Overall Cognitive Status: Impaired/Different from baseline Area of Impairment: Orientation;Memory;Following commands;Safety/judgement Orientation Level: Disoriented to;Time   Memory: Decreased short-term memory;Decreased recall of precautions Following Commands: Follows one step commands inconsistently Safety/Judgement: Decreased awareness of safety          General Comments      Exercises        Assessment/Plan    PT Assessment Patient needs continued PT services  PT Diagnosis Difficulty walking;Generalized weakness;Acute pain;Altered mental status   PT Problem List Decreased strength;Decreased activity tolerance;Decreased balance;Decreased mobility;Decreased coordination;Decreased safety awareness;Pain;Decreased knowledge of use of DME  PT Treatment Interventions DME instruction;Gait training;Stair training;Functional mobility training;Therapeutic activities;Balance training;Patient/family education;Therapeutic exercise   PT Goals (Current goals can be found in the Care Plan section) Acute Rehab PT Goals Patient Stated Goal: be out of pain PT Goal Formulation: With patient Time For Goal Achievement: 03/10/16 Potential to Achieve Goals: Fair    Frequency Min 5X/week   Barriers to discharge Decreased caregiver support family works and pt is alone during the day    Co-evaluation               End of Session Equipment Utilized During Treatment: Gait belt;Cervical collar Activity Tolerance: Patient limited by pain Patient left: in bed;with call bell/phone within reach;with bed alarm set Nurse Communication: Mobility status;Precautions;Patient  requests pain meds         Time: 6606-3016 PT Time Calculation (min) (ACUTE ONLY): 22 min   Charges:   PT Evaluation $PT Eval Moderate Complexity: 1 Procedure     PT G CodesDelorse Lek 03/03/2016, 2:51 PM Delaney Meigs, PT [phone]

## 2016-03-03 NOTE — Progress Notes (Signed)
BP 100/62 mmHg  Pulse 88  Temp(Src) 98.1 F (36.7 C) (Oral)  Resp 18  Ht 5\' 6"  (1.676 m)  Wt 160 lb 9 oz (72.831 kg)  BMI 25.93 kg/m2  SpO2 95% Alert and oriented x 4 Voice is hoarse, she is not gurgling, and appears to be clearing secretions. She did not spit up while I was speaking with her for over 10 minutes. Will continue with soft tissue neck film, and chest xray.  Will keep npo per speech.  Wound is clean, flat, dry.

## 2016-03-03 NOTE — Progress Notes (Signed)
RN noted patient having difficulty clearing secretions. Patient coughs repeatedly after bites of food or sips of fluids. Patient and family educated to sit upright, take small sips and bites, feed slowly. Family also educated to only feed patient when fully awake and alert. Patient hesitant to follow directions from staff and family, will take large sips of fluids, eat quickly. RN notified MD, MD ordered speech bedside evaluation, one time dose of decadron, see orders. Patient is currently up in chair.

## 2016-03-04 LAB — GLUCOSE, CAPILLARY
GLUCOSE-CAPILLARY: 109 mg/dL — AB (ref 65–99)
GLUCOSE-CAPILLARY: 129 mg/dL — AB (ref 65–99)
GLUCOSE-CAPILLARY: 130 mg/dL — AB (ref 65–99)
GLUCOSE-CAPILLARY: 168 mg/dL — AB (ref 65–99)
Glucose-Capillary: 111 mg/dL — ABNORMAL HIGH (ref 65–99)

## 2016-03-04 MED ORDER — MORPHINE SULFATE 10 MG RE SUPP: 10.0000 mg | RECTAL | Status: DC | PRN

## 2016-03-04 MED ORDER — DEXAMETHASONE SODIUM PHOSPHATE 4 MG/ML IJ SOLN
4.0000 mg | Freq: Four times a day (QID) | INTRAMUSCULAR | Status: AC
  Administered 2016-03-04 (×3): 4 mg via INTRAVENOUS
  Filled 2016-03-04 (×3): qty 1

## 2016-03-04 NOTE — Progress Notes (Signed)
   Subjective:    Patient ID: Nicole Fitzgerald, female    DOB: [birth year], 55 y.o.   MRN: [id number] BP 143/82 mmHg  Pulse 66  Temp(Src) 97.4 F (36.3 C) (Oral)  Resp 18  Ht 5\' 6"  (1.676 m)  Wt 160 lb 9 oz (72.831 kg)  BMI 25.93 kg/m2  SpO2 97% Nicole Fitzgerald is awake, alert, and following all commands Voice is hoarse, persistent cough, she is clearing secretions.  Morphine suppository ordered last night, not administered.  Has taken oxycodone crushed this am by nursing and no adverse events noted. Nursing monitoring patient closely when administering medications orally Wound is flat and dry, I removed the collar while patient in bed. No evidence of hematoma. No reason to pursue CT, moving air well.  HPI    Review of Systems     Objective:   Physical Exam        Assessment & Plan:

## 2016-03-04 NOTE — Progress Notes (Signed)
Physical Therapy Treatment Patient Details Name: Nicole Fitzgerald MRN: [id number] DOB: [birth year] Today's Date: 03/04/2016    History of Present Illness 55 yo admitted for C5-7 ACDF. 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 She is very concerned about going home and being there for long stretches alone. Pt will continue to follow.    Follow Up Recommendations        Equipment Recommendations       Recommendations for Other Services       Precautions / Restrictions Precautions Precautions: Fall;Cervical Required Braces or Orthoses: Cervical Brace Cervical Brace: Hard collar;At all times (Of note pt was not in hard collar when PT arrived. )    Mobility  Bed Mobility Overal bed mobility: Needs Assistance Bed Mobility: Supine to Sit     Supine to sit: Min assist        Transfers Overall transfer level: Needs assistance Equipment used: Rolling walker (2 wheeled) Transfers: Sit to/from Stand Sit to Stand: Min guard         General transfer comment: Cues for hand placement and safest technique  Ambulation/Gait Ambulation/Gait assistance: Min assist Ambulation Distance (Feet): 90 Feet Assistive device: Rolling walker (2 wheeled) Gait Pattern/deviations: Step-through pattern;Decreased stride length;Antalgic (Pt not wanting to put full weight on RLE due to IV placement)   Gait velocity interpretation: Below normal speed for age/gender     Stairs            Wheelchair Mobility    Modified Rankin (Stroke Patients Only)       Balance                                    Cognition Arousal/Alertness: Awake/alert Behavior During Therapy: Agitated Overall Cognitive Status: Within Functional Limits for tasks assessed                      Exercises      General Comments General comments (skin integrity, edema, etc.): Pt reports Dr. Franky Fitzgerald took  her cervical collar off and siad she did not need it. I was not comfortable ambulating her without it and she was agreeable to put it back on. Pt's friend Nicole Fitzgerald present for session.        Pertinent Vitals/Pain Pain Assessment: 0-10 Pain Score: 7  Pain Location: throat and neck, also headache Pain Intervention(s): Limited activity within patient's tolerance;Monitored during session;Premedicated before session    Home Living                      Prior Function            PT Goals (current goals can now be found in the care plan section) Acute Rehab PT Goals Patient Stated Goal: To go to a rehab facility before going home Progress towards PT goals: Progressing toward goals    Frequency       PT Plan      Co-evaluation             End of Session Equipment Utilized During Treatment: Gait belt;Cervical collar Activity Tolerance: Patient tolerated treatment well Patient left: in chair;with call bell/phone within reach;with chair alarm set;with family/visitor present     Time: 5038-8828 PT Time Calculation (min) (ACUTE ONLY): 30 min  Charges:  $Gait Training: 23-37 mins  G Codes:      Nicole Fitzgerald 25-Mar-2016, 3:22 PM  Nicole Fitzgerald, [place]  [phone]/03/25

## 2016-03-05 ENCOUNTER — Encounter (HOSPITAL_COMMUNITY): Payer: Self-pay | Admitting: Neurosurgery

## 2016-03-05 LAB — GLUCOSE, CAPILLARY
GLUCOSE-CAPILLARY: 100 mg/dL — AB (ref 65–99)
Glucose-Capillary: 137 mg/dL — ABNORMAL HIGH (ref 65–99)

## 2016-03-05 MED ORDER — OXYCODONE HCL 10 MG PO TABS: 10.0000 mg | ORAL_TABLET | ORAL | Status: AC

## 2016-03-05 MED ORDER — CYCLOBENZAPRINE HCL 10 MG PO TABS: 10.0000 mg | ORAL_TABLET | Freq: Three times a day (TID) | ORAL | Status: AC | PRN

## 2016-03-05 NOTE — Discharge Instructions (Signed)
Anterior Cervical Diskectomy and Fusion °Anterior cervical diskectomy and fusion is a surgery that is done on the neck (cervical spine) to take pressure off of the nerves or the spinal cord. It is performed through the front (anterior) part of the neck. During this surgery, the damaged disk that is causing pain, numbness, or weakness is removed. The area where the disk was removed is filled with a plastic spacer implant, a bone graft, or both. These implants and bone grafts take pressure off of the nerves and spinal cord by making more room for the nerves to leave the spine. °LET YOUR HEALTH CARE PROVIDER KNOW ABOUT: °· Any allergies you have. °· All medicines you are taking, including vitamins, herbs, eye drops, creams, and over-the-counter medicines. °· Previous problems you or members of your family have had with the use of anesthetics. °· Any blood disorders you have. °· Previous surgeries you have had. °· Any medical conditions you may have. °RISKS AND COMPLICATIONS °Generally, this is a safe procedure. However, problems may occur, including: °· Infection. °· Bleeding with the possible need for blood transfusion. °· Injury to surrounding structures, including nerves. °· Leakage of fluid from the brain or spinal cord (cerebrospinal fluid). °· Blood clots. °· Temporary breathing difficulties after surgery. °BEFORE THE PROCEDURE °· Follow your health care provider's instructions about eating or drinking restrictions. °· Ask your health care provider about: °¨ Changing or stopping your regular medicines. This is especially important if you are taking diabetes medicines or blood thinners. °¨ Taking medicines such as aspirin and ibuprofen. These medicines can thin your blood. Do not take these medicines before your procedure if your health care provider instructs you not to. °· You may be given antibiotic medicines to help prevent infection. °· Your incision site may be marked on your neck. °PROCEDURE °· An IV tube  will be inserted into one of your veins. °· You will be given one or more of the following: °¨ A medicine that helps you relax (sedative). °¨ A medicine that makes you fall asleep (general anesthetic). °· A breathing tube will be placed. °· Your neck will be cleaned with a germ-killing solution (antiseptic). °· Your surgeon will make an incision on the front of your neck, usually within a skinfold line. °· Your neck muscles will be spread apart, and the damaged disk and bone spurs will be removed. °· The area where the disk was removed will be filled with a small plastic spacer implant, a bone graft, or both. °· Your surgeon may put metal plates and screws (hardware) in your neck. This helps to stabilize the surgical site and keep implants and bone grafts in place. The hardware reduces motion at the surgical site so your bones can grow together (fuse). This provides extra support to your neck. °· The incision will be closed with stitches (sutures). °· A bandage (dressing) will be applied to cover the incision. °The procedure may vary among health care providers and hospitals. °AFTER THE PROCEDURE °· Your blood pressure, heart rate, breathing rate, and blood oxygen level will be monitored often until the medicines you were given have worn off.  °· You will be monitored for any signs of complications from the procedure, such as: °¨ Too much bleeding from the incision site. °¨ A buildup of blood under your skin at the surgical site. °¨ Difficulty breathing. °· You may continue to receive antibiotics. °· You can start to eat as soon as you feel comfortable. °· You may be given   a neck brace to wear after surgery. This brace limits your neck movement while your bones are fusing together. Follow your health care provider's instructions about how often and how long you need to wear this.   This information is not intended to replace advice given to you by your health care provider. Make sure you discuss any questions you  have with your health care provider.   Document Released: 12/05/2009 Document Revised: 01/07/2015 Document Reviewed: 12/05/2009 Elsevier Interactive Patient Education 2016 [employer]    Wound Care Keep incision area dry.  You may remove outer bandage after 2 days and shower.   If you shower prior cover incision with plastic wrap.  Do not put any creams, lotions, or ointments on incision. Leave steri-strips on neck.  They will fall off by themselves. Activity Walk each and every day, increasing distance each day. No lifting greater than 5 lbs.  Avoid excessive neck motion. No driving for 2 weeks; may ride as a passenger locally. Wear neck brace at all times except when showering. Diet Resume your normal diet.  Return to Work Will be discussed at you follow up appointment. Call Your Doctor If Any of These Occur Redness, drainage, or swelling at the wound.  Temperature greater than 101 degrees. Severe pain not relieved by pain medication. Increased difficulty swallowing.  Incision starts to come apart. Follow Up Appt Call today and ask for Lurena Joiner for appointment in 1-2 weeks ([phone]) or for problems.  If you have any hardware placed in your spine, you will need an x-ray before your appointment.

## 2016-03-05 NOTE — Progress Notes (Signed)
Pt discharged per orders from MD. Pt educated on discharge instructions. Pt verbalized understanding of instructions. IV removed before discharge. All questions and concerns were addressed before discharge. Pt exited hospital via wheelchair. 

## 2016-03-05 NOTE — Progress Notes (Signed)
Ambulated in hallway with walker x 1assist. Feels discomfort on rt foot from IV line. Back to bed.

## 2016-03-05 NOTE — Progress Notes (Signed)
PT Cancellation Note  Patient Details Name: Nicole Fitzgerald MRN: [id number] DOB: Aug 05, 1961   Cancelled Treatment:    Reason Eval/Treat Not Completed: Patient declined, states that she is about to leave and she does not want to do any walking.     Christiane Ha, PT, CSCS Pager [phone] Office [phone]  03/05/2016, 3:46 PM

## 2016-03-05 NOTE — Care Management Note (Signed)
Case Management Note  Patient Details  Name: Nicole Fitzgerald MRN: [id number] Date of Birth: April 21, 1961  Subjective/Objective:                    Action/Plan: Patient was admitted for a Cervical Five-Six,Cervical Six-Seven Anterior cervical decompression/diskectomy/fusion.  Will follow for discharge needs pending PT/OT evals and physician orders.  Expected Discharge Date:                  Expected Discharge Plan:     In-House Referral:     Discharge planning Services     Post Acute Care Choice:    Choice offered to:     DME Arranged:    DME Agency:     HH Arranged:    HH Agency:     Status of Service:  In process, will continue to follow  Medicare Important Message Given:    Date Medicare IM Given:    Medicare IM give by:    Date Additional Medicare IM Given:    Additional Medicare Important Message give by:     If discussed at Long Length of Stay Meetings, dates discussed:    Additional CommentsAnda Kraft, RN 03/05/2016, 11:18 AM [phone]

## 2016-03-05 NOTE — Care Management Important Message (Signed)
Important Message  Patient Details  Name: Nicole Fitzgerald MRN: [id number] Date of Birth: 1961-01-18   Medicare Important Message Given:  Yes    Lorisa Scheid P Shannan Slinker 03/05/2016, 3:01 PM

## 2016-03-05 NOTE — Discharge Summary (Signed)
Physician Discharge Summary  Patient ID: Nicole Fitzgerald MRN: [id number] DOB/AGE: 57-27-1962 57 y.o.  Admit date: 03/02/2016 Discharge date: 03/05/2016  Admission Diagnoses:  Discharge Diagnoses:  Active Problems:   Cervical spinal stenosis   Discharged Condition: good  Hospital Course: The patient was admitted to hospital where she underwent an uncomplicated C5-6 C6-7 ACDF. Postoperative she had difficulty with pain control and swallowing which has clear dramatically. Currently she is tolerating a diet without difficulty. Her wound is healing well. She is neurologically stable. She is ready for discharge home.  Consults:   Significant Diagnostic Studies:   Treatments:   Discharge Exam: Blood pressure 94/76, pulse 88, temperature 98.6 F (37 C), temperature source Oral, resp. rate 16, height 5\' 6"  (1.676 m), weight 72.831 kg (160 lb 9 oz), SpO2 97 %.   Disposition: 03-Skilled Nursing Facility     Medication List    TAKE these medications        ALPRAZolam 1 MG tablet  Commonly known as:  XANAX  Take 1 tablet (1 mg total) by mouth 4 (four) times daily.     cyclobenzaprine 10 MG tablet  Commonly known as:  FLEXERIL  Take 1 tablet (10 mg total) by mouth 3 (three) times daily as needed for muscle spasms.     gabapentin 300 MG capsule  Commonly known as:  NEURONTIN  Take 1 capsule by mouth 3 (three) times daily.     LIALDA 1.2 g EC tablet  Generic drug:  mesalamine  Take 1.2 g by mouth 2 (two) times daily.     LIDODERM 5 %  Generic drug:  lidocaine  Place 1 patch onto the skin 2 (two) times daily.     methylphenidate 20 MG tablet  Commonly known as:  RITALIN  Take 20 mg by mouth 3 (three) times daily.     mometasone 50 MCG/ACT nasal spray  Commonly known as:  NASONEX  Place 2 sprays into the nose daily as needed.     morphine 30 MG 12 hr tablet  Commonly known as:  MS CONTIN  Take 1 tablet (30 mg total) by mouth every 12 (twelve) hours.     NEXIUM 40 MG capsule   Generic drug:  esomeprazole  Take 40 mg by mouth 2 (two) times daily.     OLANZapine 10 MG tablet  Commonly known as:  ZYPREXA  Take 10 mg by mouth at bedtime.     Oxycodone HCl 10 MG Tabs  Take 1-2 tablets (10-20 mg total) by mouth every 4 (four) hours.     predniSONE 20 MG tablet  Commonly known as:  DELTASONE  Take 1 tablet by mouth daily.     PREMPRO 0.625-2.5 MG tablet  Generic drug:  estrogen (conjugated)-medroxyprogesterone  Take 1 tablet by mouth daily.     PROAIR HFA 108 (90 Base) MCG/ACT inhaler  Generic drug:  albuterol  Inhale 1 puff into the lungs every 4 (four) hours as needed for wheezing or shortness of breath.     promethazine 25 MG tablet  Commonly known as:  PHENERGAN  Take 25 mg by mouth every 6 (six) hours as needed for nausea or vomiting.     traZODone 100 MG tablet  Commonly known as:  DESYREL  Take 100 mg by mouth 2 (two) times daily.           Follow-up Information    Follow up with Temple Pacini, MD.   Specialty:  Neurosurgery   Contact information:   [phone]  [address] [place] [address] [phone]       Signed: Temple Pacini 03/05/2016, 1:04 PM

## 2016-03-05 NOTE — [place] FL2 (Signed)
[place] MEDICAID FL2 LEVEL OF CARE SCREENING TOOL     IDENTIFICATION  Patient Name: Nicole Fitzgerald Birthdate: 20-Jan-1961 Sex: female Admission Date (Current Location): 03/02/2016  [facility] and [place] Number:  Producer, television/film/video and Address:  [facility] [place]. [facility], [address], [place], [place] [address]      Provider Number: [id number]  Attending Physician Name and Address:  Julio Sicks, MD  Relative Name and Phone Number:       Current Level of Care: Hospital Recommended Level of Care: Skilled Nursing Facility Prior Approval Number:    Date Approved/Denied:   PASRR Number:    Discharge Plan: SNF    Current Diagnoses: Patient Active Problem List   Diagnosis Date Noted  . Cervical spinal stenosis 03/02/2016  . Hepatitis C antibody test positive   . IVDU (intravenous drug user)   . Osteomyelitis (HCC) 09/06/2015  . Paraspinal abscess (HCC) 09/06/2015  . Chronic neck and back pain 08/14/2015  . Prolonged QT interval 08/13/2015  . RBBB (right bundle branch block) 08/13/2015  . Polysubstance abuse 08/12/2015  . Narcotic abuse   . Psychoses   . Schizophrenia (HCC) 08/11/2015  . CAP (community acquired pneumonia) 08/11/2015  . Crohn's disease (HCC) 08/11/2015    Orientation RESPIRATION BLADDER Height & Weight     Self, Time, Situation, Place  Normal Continent Weight: 160 lb 9 oz (72.831 kg) Height:   (167.6 cm)  BEHAVIORAL SYMPTOMS/MOOD NEUROLOGICAL BOWEL NUTRITION STATUS      Continent    AMBULATORY STATUS COMMUNICATION OF NEEDS Skin   Limited Assist Verbally Normal                       Personal Care Assistance Level of Assistance  Feeding, Bathing, Dressing Bathing Assistance: Limited assistance Feeding assistance: Independent Dressing Assistance: Limited assistance     Functional Limitations Info  Sight, Hearing, Speech Sight Info: Adequate Hearing Info: Adequate Speech Info: Adequate    SPECIAL CARE FACTORS FREQUENCY  PT  (By licensed PT), OT (By licensed OT)                    Contractures      Additional Factors Info  Code Status, Allergies Code Status Info: Prior Allergies Info: Aspirin           Current Medications (03/05/2016):  This is the current hospital active medication list Current Facility-Administered Medications  Medication Dose Route Frequency Provider Last Rate Last Dose  . 0.9 %  sodium chloride infusion  250 mL Intravenous Continuous Julio Sicks, MD 1 mL/hr at 03/03/16 0048 250 mL at 03/03/16 0048  . 0.9 % NaCl with KCl 20 mEq/ L  infusion   Intravenous Continuous Coletta Memos, MD 100 mL/hr at 03/04/16 0309    . acetaminophen (TYLENOL) tablet 650 mg  650 mg Oral Q4H PRN Julio Sicks, MD       Or  . acetaminophen (TYLENOL) suppository 650 mg  650 mg Rectal Q4H PRN Julio Sicks, MD   650 mg at 03/04/16 0526  . albuterol (PROVENTIL) (2.5 MG/3ML) 0.083% nebulizer solution 2.5 mg  2.5 mg Nebulization Q4H PRN Julio Sicks, MD      . ALPRAZolam Prudy Feeler) tablet 1 mg  1 mg Oral QID Julio Sicks, MD   1 mg at 03/05/16 1116  . cyclobenzaprine (FLEXERIL) tablet 10 mg  10 mg Oral TID PRN Julio Sicks, MD      . fluticasone [facility]) 50 MCG/ACT nasal  spray 2 spray  2 spray Each Nare Daily Julio Sicks, MD   2 spray at 03/05/16 1116  . gabapentin (NEURONTIN) capsule 300 mg  300 mg Oral TID Julio Sicks, MD   300 mg at 03/05/16 1116  . HYDROcodone-acetaminophen (NORCO/VICODIN) 5-325 MG per tablet 1-2 tablet  1-2 tablet Oral Q4H PRN Julio Sicks, MD   2 tablet at 03/04/16 1429  . HYDROmorphone (DILAUDID) injection 0.5-1 mg  0.5-1 mg Intravenous Q2H PRN Julio Sicks, MD   1 mg at 03/05/16 1223  . lidocaine (LIDODERM) 5 % 1 patch  1 patch Transdermal BID Julio Sicks, MD   1 patch at 03/05/16 1114  . menthol-cetylpyridinium (CEPACOL) lozenge 3 mg  1 lozenge Oral PRN Julio Sicks, MD       Or  . phenol [facility]) mouth spray 1 spray  1 spray Mouth/Throat PRN Julio Sicks, MD   1 spray at 03/03/16 [phone]  . mesalamine  (LIALDA) EC tablet 1.2 g  1.2 g Oral BID Julio Sicks, MD   1.2 g at 03/05/16 1116  . methylphenidate (RITALIN) tablet 20 mg  20 mg Oral TID Julio Sicks, MD   20 mg at 03/05/16 1115  . morphine (MS CONTIN) 12 hr tablet 30 mg  30 mg Oral Q12H Julio Sicks, MD   30 mg at 03/05/16 1116  . morphine suppository 10 mg  10 mg Rectal Q2H PRN Coletta Memos, MD      . OLANZapine (ZYPREXA) tablet 10 mg  10 mg Oral QHS Julio Sicks, MD   10 mg at 03/04/16 2229  . ondansetron (ZOFRAN) injection 4 mg  4 mg Intravenous Q4H PRN Julio Sicks, MD      . oxyCODONE (Oxy IR/ROXICODONE) immediate release tablet 5 mg  5 mg Oral Q4H Julio Sicks, MD   5 mg at 03/05/16 1116  . oxyCODONE-acetaminophen (PERCOCET/ROXICET) 5-325 MG per tablet 1-2 tablet  1-2 tablet Oral Q4H PRN Julio Sicks, MD      . pantoprazole (PROTONIX) EC tablet 40 mg  40 mg Oral Daily Julio Sicks, MD   40 mg at 03/05/16 1116  . promethazine (PHENERGAN) tablet 25 mg  25 mg Oral Q6H PRN Julio Sicks, MD      . sodium chloride flush (NS) 0.9 % injection 3 mL  3 mL Intravenous Q12H Julio Sicks, MD   3 mL at 03/05/16 0906  . sodium chloride flush (NS) 0.9 % injection 3 mL  3 mL Intravenous PRN Julio Sicks, MD      . traZODone (DESYREL) tablet 100 mg  100 mg Oral BID Julio Sicks, MD   100 mg at 03/04/16 2227     Discharge Medications: Please see discharge summary for a list of discharge medications.  Relevant Imaging Results:  Relevant Lab Results:   Additional Information SS#: [id number]  Loleta Dicker, LCSW

## 2016-03-05 NOTE — Care Management Note (Addendum)
Case Management Note  Patient Details  Name: Nicole Fitzgerald MRN: [id number] Date of Birth: 1961-10-18  Subjective/Objective:                    Action/Plan: Some confusion about disposition of patient at discharge. PT recommending SNF. Dr Jordan Likes has discharged patient to home. Patient states she does not feel safe going home d/t being alone for long periods of time.  CM spoke with Dr Jordan Likes and he said patient could discharge home or to SNF. Patient not able to get into SNF rehab until tomorrow d/t PASAAR number. CM called Dr Jordan Likes again and he said patient could stay the night. CM went back and talked to the patient and her boyfriend and she has decided she is going home. CSW and Bedside RN updated.   Expected Discharge Date:                  Expected Discharge Plan:     In-House Referral:     Discharge planning Services     Post Acute Care Choice:    Choice offered to:     DME Arranged:    DME Agency:     HH Arranged:    HH Agency:     Status of Service:  In process, will continue to follow  Medicare Important Message Given:  Yes Date Medicare IM Given:    Medicare IM give by:    Date Additional Medicare IM Given:    Additional Medicare Important Message give by:     If discussed at Long Length of Stay Meetings, dates discussed:    Additional Comments:  Kermit Balo, RN 03/05/2016, 4:03 PM

## 2016-04-10 DIAGNOSIS — K219 Gastro-esophageal reflux disease without esophagitis: Secondary | ICD-10-CM | POA: Diagnosis not present

## 2016-04-10 DIAGNOSIS — K5000 Crohn's disease of small intestine without complications: Secondary | ICD-10-CM | POA: Diagnosis not present

## 2016-04-10 DIAGNOSIS — G629 Polyneuropathy, unspecified: Secondary | ICD-10-CM | POA: Diagnosis not present

## 2016-04-10 DIAGNOSIS — G894 Chronic pain syndrome: Secondary | ICD-10-CM | POA: Diagnosis not present

## 2016-04-12 DIAGNOSIS — M4802 Spinal stenosis, cervical region: Secondary | ICD-10-CM | POA: Diagnosis not present

## 2016-05-23 DIAGNOSIS — M4802 Spinal stenosis, cervical region: Secondary | ICD-10-CM | POA: Diagnosis not present

## 2016-05-23 IMAGING — RF DG CERVICAL SPINE 2 OR 3 VIEWS
1 series · 2 of 2 positions shown · non-contrast
Comparison: MRI cervical spine [DATE]

CLINICAL DATA: C5-7 ACDF

EXAM:
DG C-ARM 61-120 MIN; CERVICAL SPINE - 2-3 VIEW
FLUOROSCOPY TIME:  7 seconds

[Series 1: run · 2 of 2 slices shown]
[im 1/2]
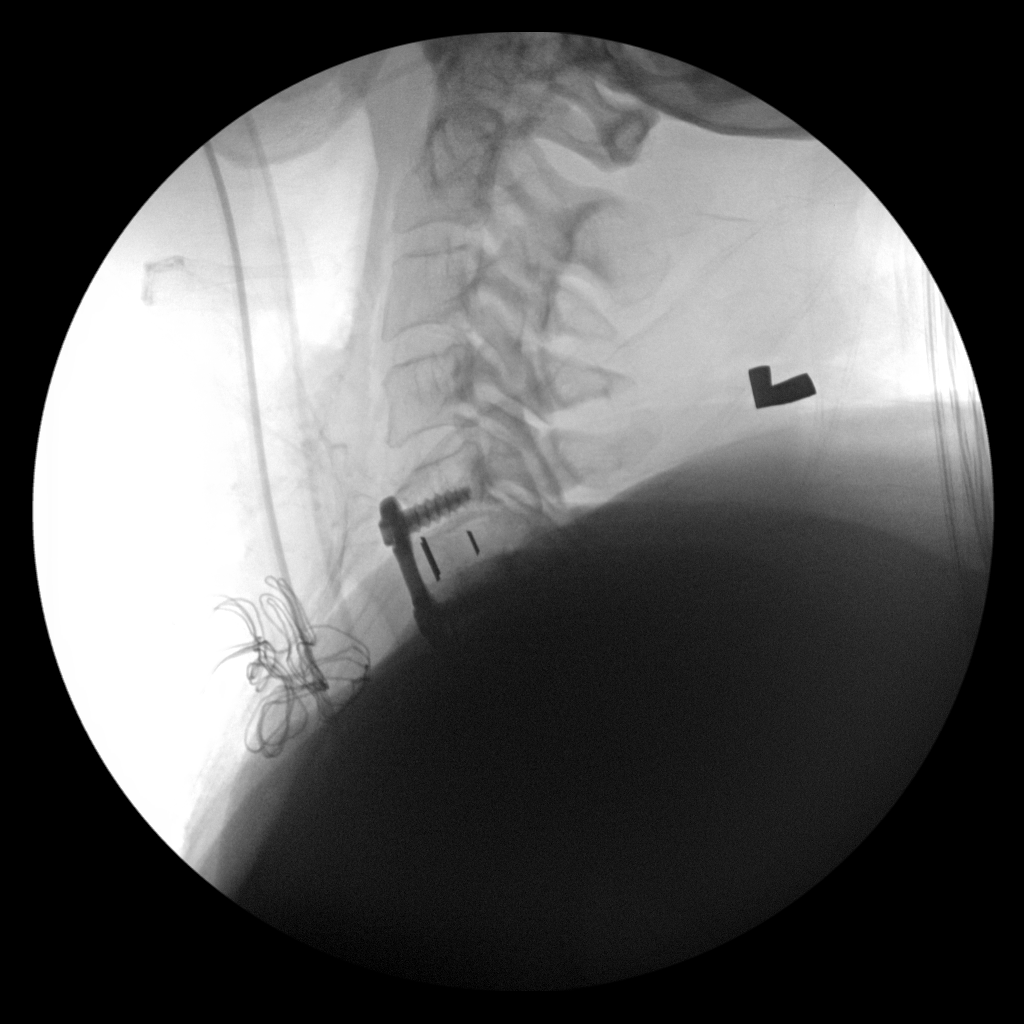
[im 2/2]
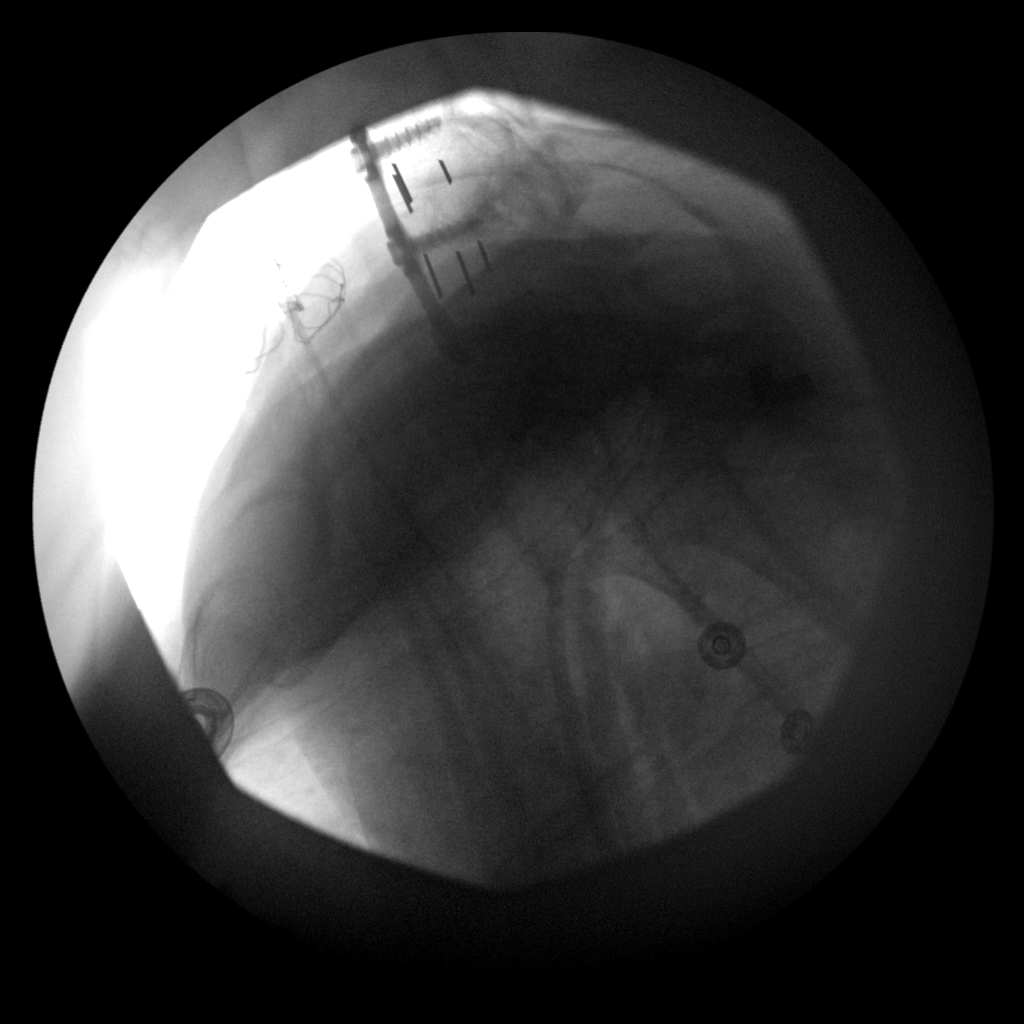

[2 of 2 positions shown; findings below may reference images not displayed]

FINDINGS: Two lateral intraoperative fluoroscopic images during at C5-7 ACDF.
IMPRESSION: Intraoperative fluoroscopic spot images, as above.

## 2016-05-24 IMAGING — DX DG CHEST 2V
2 series · 2 of 2 positions shown · non-contrast
Comparison: Chest x-ray dated 09/05/2015.

CLINICAL DATA: Dysphagia, recent cervical surgery

EXAM:
CHEST  2 VIEW

[chest lat]
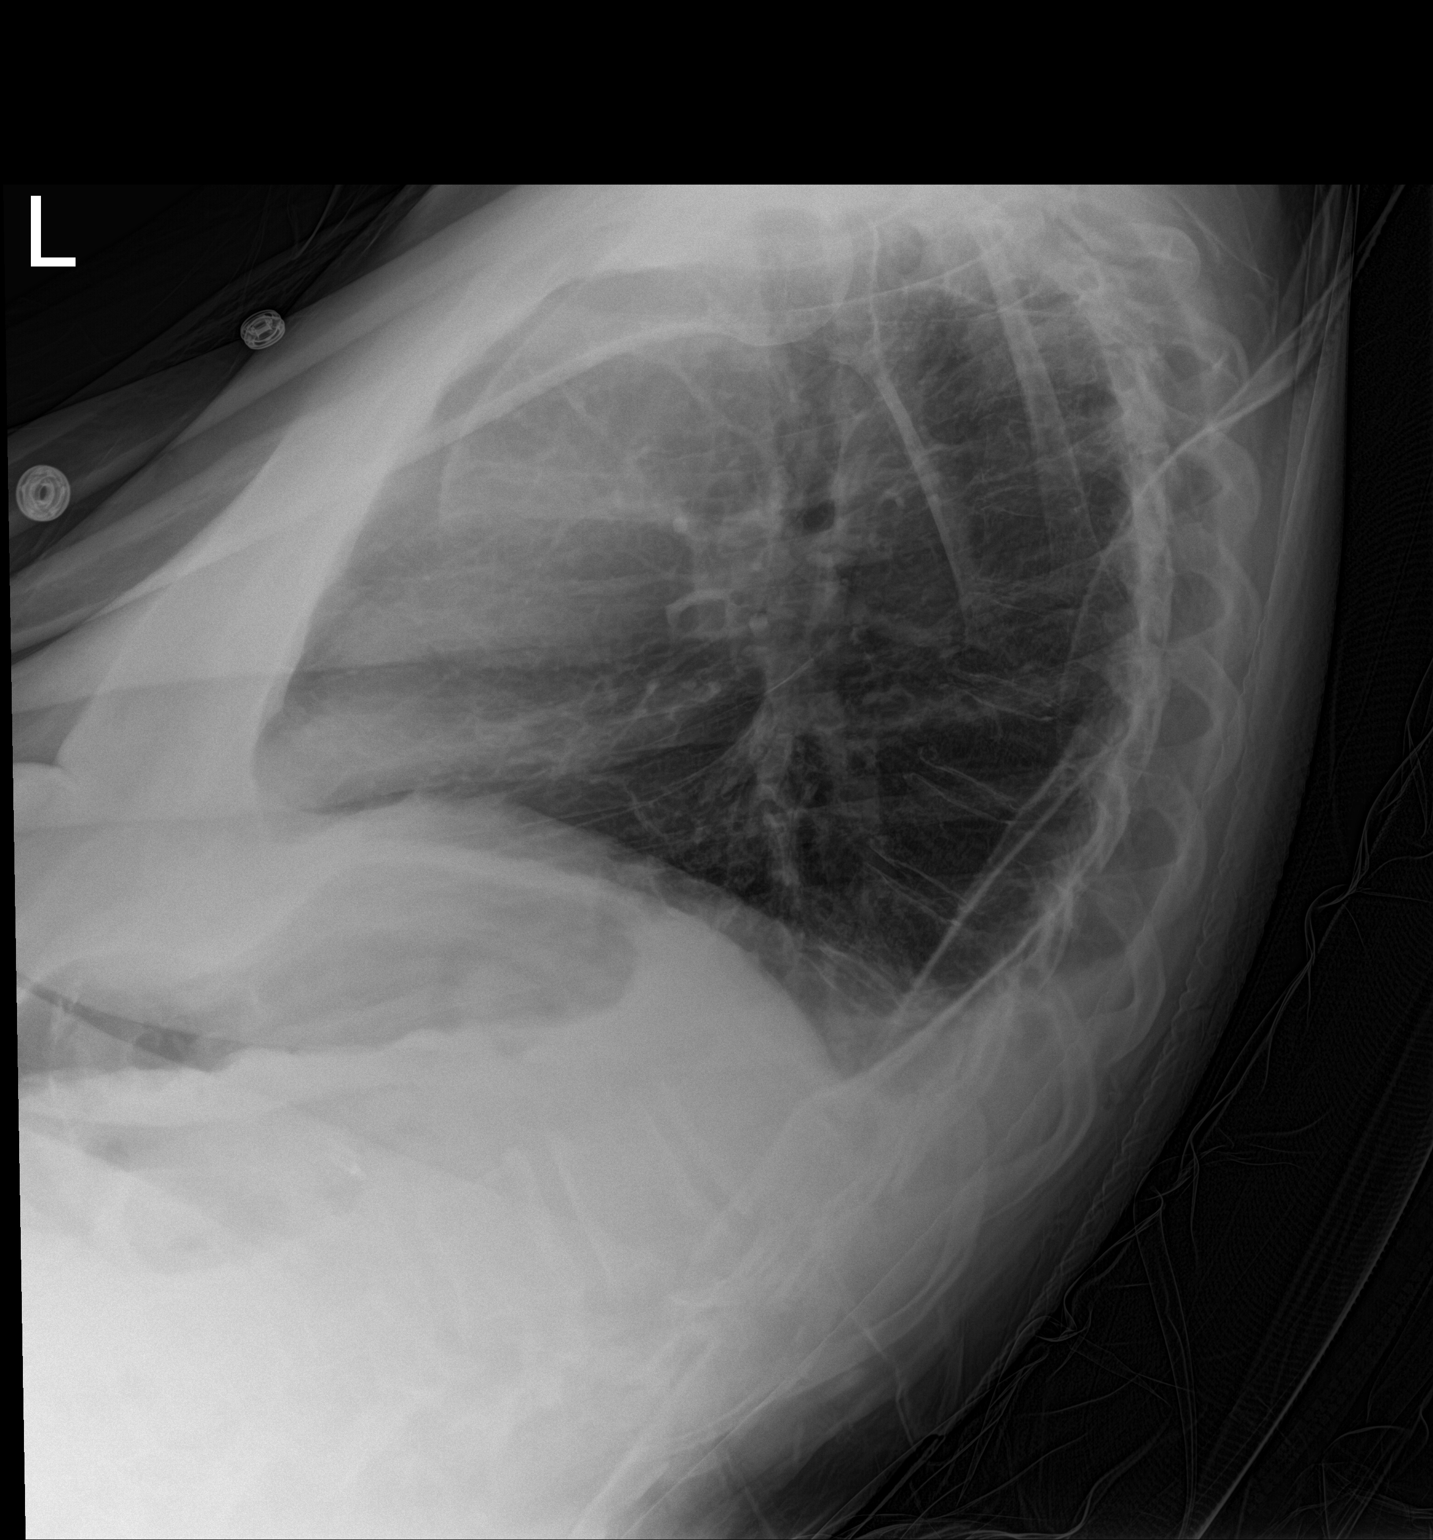

[chest ap]
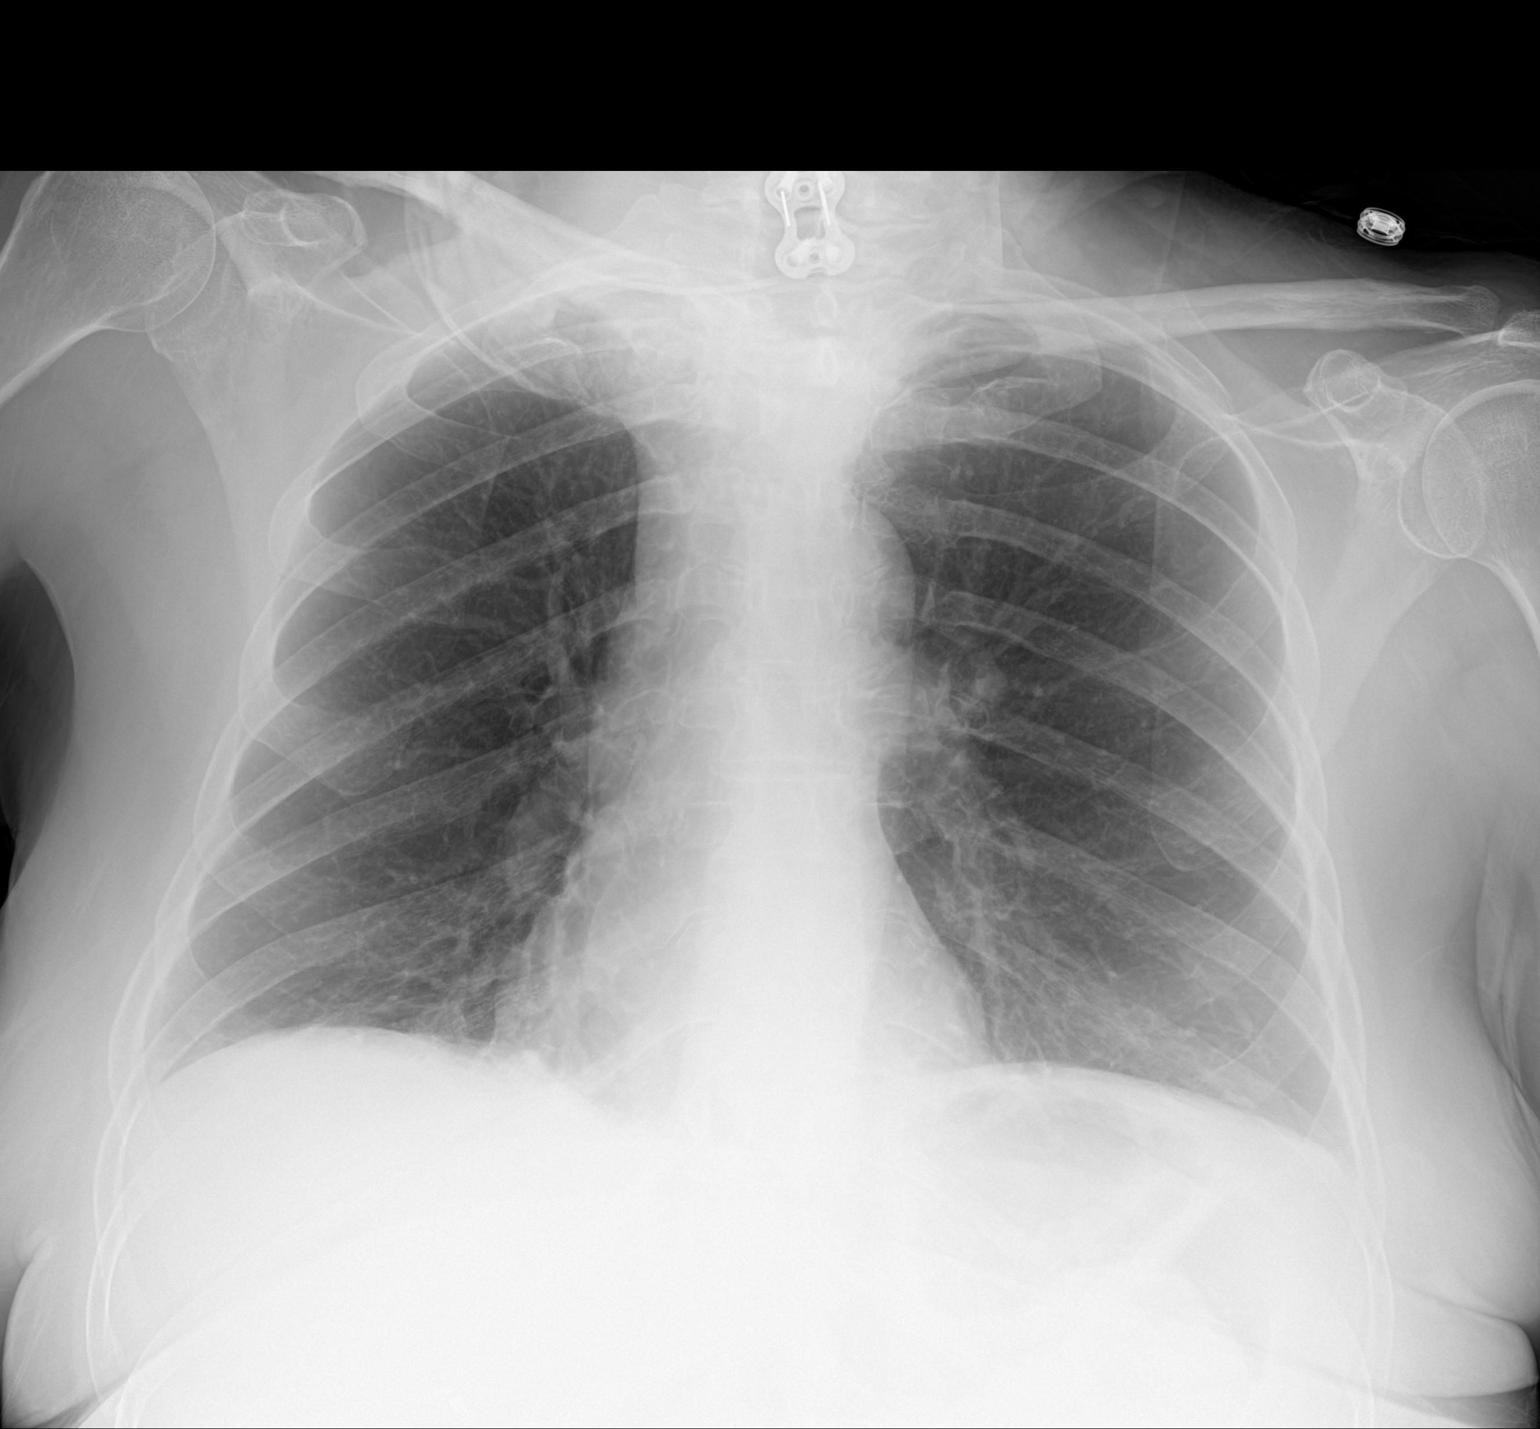

[2 of 2 positions shown; findings below may reference images not displayed]

FINDINGS: Heart size is normal. Cardiomediastinal silhouette is stable in size
and configuration.

Patient is status post anterior cervical fusion hardware placement
within the lower cervical spine. There is increased density
overlying the adjacent upper mediastinum/lower neck, presumably some
degree of edema status post recent surgery.

Lungs are clear.  No pleural effusion.  No pneumothorax.
IMPRESSION: 1. New anterior cervical fusion hardware within the lower cervical
spine. Increased density overlying the upper mediastinum and lower
neck is presumably some related postsurgical edema. Soft tissue
hematoma could cause a similar appearance. Given the history of
dysphagia, would consider CT of the neck/chest for further
characterization.
2. Lungs are clear.

## 2016-06-07 DIAGNOSIS — G894 Chronic pain syndrome: Secondary | ICD-10-CM | POA: Diagnosis not present

## 2016-06-07 DIAGNOSIS — R5383 Other fatigue: Secondary | ICD-10-CM | POA: Diagnosis not present

## 2016-06-07 DIAGNOSIS — K219 Gastro-esophageal reflux disease without esophagitis: Secondary | ICD-10-CM | POA: Diagnosis not present

## 2016-06-07 DIAGNOSIS — Z5181 Encounter for therapeutic drug level monitoring: Secondary | ICD-10-CM | POA: Diagnosis not present

## 2016-06-07 DIAGNOSIS — K5000 Crohn's disease of small intestine without complications: Secondary | ICD-10-CM | POA: Diagnosis not present

## 2016-06-07 DIAGNOSIS — G629 Polyneuropathy, unspecified: Secondary | ICD-10-CM | POA: Diagnosis not present

## 2016-06-21 ENCOUNTER — Other Ambulatory Visit: Payer: Self-pay | Admitting: Internal Medicine

## 2016-06-21 DIAGNOSIS — G894 Chronic pain syndrome: Secondary | ICD-10-CM | POA: Diagnosis not present

## 2016-06-21 DIAGNOSIS — Z9181 History of falling: Secondary | ICD-10-CM | POA: Diagnosis not present

## 2016-06-21 DIAGNOSIS — K5000 Crohn's disease of small intestine without complications: Secondary | ICD-10-CM | POA: Diagnosis not present

## 2016-06-21 DIAGNOSIS — R5383 Other fatigue: Secondary | ICD-10-CM | POA: Diagnosis not present

## 2016-06-21 DIAGNOSIS — G629 Polyneuropathy, unspecified: Secondary | ICD-10-CM | POA: Diagnosis not present

## 2016-06-21 DIAGNOSIS — Z7952 Long term (current) use of systemic steroids: Secondary | ICD-10-CM | POA: Diagnosis not present

## 2016-06-21 DIAGNOSIS — K219 Gastro-esophageal reflux disease without esophagitis: Secondary | ICD-10-CM | POA: Diagnosis not present

## 2016-06-21 DIAGNOSIS — Z1231 Encounter for screening mammogram for malignant neoplasm of breast: Secondary | ICD-10-CM

## 2016-06-25 DIAGNOSIS — G629 Polyneuropathy, unspecified: Secondary | ICD-10-CM | POA: Diagnosis not present

## 2016-06-25 DIAGNOSIS — Z9181 History of falling: Secondary | ICD-10-CM | POA: Diagnosis not present

## 2016-06-25 DIAGNOSIS — R5383 Other fatigue: Secondary | ICD-10-CM | POA: Diagnosis not present

## 2016-06-25 DIAGNOSIS — G894 Chronic pain syndrome: Secondary | ICD-10-CM | POA: Diagnosis not present

## 2016-06-25 DIAGNOSIS — K219 Gastro-esophageal reflux disease without esophagitis: Secondary | ICD-10-CM | POA: Diagnosis not present

## 2016-06-25 DIAGNOSIS — K5000 Crohn's disease of small intestine without complications: Secondary | ICD-10-CM | POA: Diagnosis not present

## 2016-06-25 DIAGNOSIS — Z7952 Long term (current) use of systemic steroids: Secondary | ICD-10-CM | POA: Diagnosis not present

## 2016-07-05 ENCOUNTER — Ambulatory Visit: Payer: Self-pay

## 2016-07-05 DIAGNOSIS — G894 Chronic pain syndrome: Secondary | ICD-10-CM | POA: Diagnosis not present

## 2016-07-05 DIAGNOSIS — Z1389 Encounter for screening for other disorder: Secondary | ICD-10-CM | POA: Diagnosis not present

## 2016-07-05 DIAGNOSIS — Z5181 Encounter for therapeutic drug level monitoring: Secondary | ICD-10-CM | POA: Diagnosis not present

## 2016-07-05 DIAGNOSIS — R32 Unspecified urinary incontinence: Secondary | ICD-10-CM | POA: Diagnosis not present

## 2016-07-05 DIAGNOSIS — Z0000 Encounter for general adult medical examination without abnormal findings: Secondary | ICD-10-CM | POA: Diagnosis not present

## 2016-07-05 DIAGNOSIS — K5000 Crohn's disease of small intestine without complications: Secondary | ICD-10-CM | POA: Diagnosis not present

## 2016-07-05 DIAGNOSIS — N390 Urinary tract infection, site not specified: Secondary | ICD-10-CM | POA: Diagnosis not present

## 2016-07-25 DIAGNOSIS — G894 Chronic pain syndrome: Secondary | ICD-10-CM | POA: Diagnosis not present

## 2016-07-25 DIAGNOSIS — L299 Pruritus, unspecified: Secondary | ICD-10-CM | POA: Diagnosis not present

## 2016-07-25 DIAGNOSIS — K5090 Crohn's disease, unspecified, without complications: Secondary | ICD-10-CM | POA: Diagnosis not present

## 2016-08-01 DIAGNOSIS — Z720 Tobacco use: Secondary | ICD-10-CM | POA: Diagnosis not present

## 2016-08-01 DIAGNOSIS — R0600 Dyspnea, unspecified: Secondary | ICD-10-CM | POA: Diagnosis not present

## 2016-08-01 DIAGNOSIS — R05 Cough: Secondary | ICD-10-CM | POA: Diagnosis not present

## 2016-08-01 DIAGNOSIS — Z79899 Other long term (current) drug therapy: Secondary | ICD-10-CM | POA: Diagnosis not present

## 2016-08-01 DIAGNOSIS — Z79891 Long term (current) use of opiate analgesic: Secondary | ICD-10-CM | POA: Diagnosis not present

## 2016-08-01 DIAGNOSIS — R918 Other nonspecific abnormal finding of lung field: Secondary | ICD-10-CM | POA: Diagnosis not present

## 2016-08-01 DIAGNOSIS — Z886 Allergy status to analgesic agent status: Secondary | ICD-10-CM | POA: Diagnosis not present

## 2016-08-01 DIAGNOSIS — J189 Pneumonia, unspecified organism: Secondary | ICD-10-CM | POA: Diagnosis not present

## 2016-08-09 DIAGNOSIS — Z7952 Long term (current) use of systemic steroids: Secondary | ICD-10-CM | POA: Diagnosis not present

## 2016-08-09 DIAGNOSIS — Z4682 Encounter for fitting and adjustment of non-vascular catheter: Secondary | ICD-10-CM | POA: Diagnosis not present

## 2016-08-09 DIAGNOSIS — E162 Hypoglycemia, unspecified: Secondary | ICD-10-CM | POA: Diagnosis not present

## 2016-08-09 DIAGNOSIS — J9690 Respiratory failure, unspecified, unspecified whether with hypoxia or hypercapnia: Secondary | ICD-10-CM | POA: Diagnosis not present

## 2016-08-09 DIAGNOSIS — G9340 Encephalopathy, unspecified: Secondary | ICD-10-CM | POA: Diagnosis not present

## 2016-08-09 DIAGNOSIS — Z886 Allergy status to analgesic agent status: Secondary | ICD-10-CM | POA: Diagnosis not present

## 2016-08-09 DIAGNOSIS — Z79891 Long term (current) use of opiate analgesic: Secondary | ICD-10-CM | POA: Diagnosis not present

## 2016-08-09 DIAGNOSIS — Z452 Encounter for adjustment and management of vascular access device: Secondary | ICD-10-CM | POA: Diagnosis not present

## 2016-08-09 DIAGNOSIS — Z79899 Other long term (current) drug therapy: Secondary | ICD-10-CM | POA: Diagnosis not present

## 2016-08-09 DIAGNOSIS — R569 Unspecified convulsions: Secondary | ICD-10-CM | POA: Diagnosis not present

## 2016-08-09 DIAGNOSIS — J9621 Acute and chronic respiratory failure with hypoxia: Secondary | ICD-10-CM | POA: Diagnosis not present

## 2016-08-09 DIAGNOSIS — J9601 Acute respiratory failure with hypoxia: Secondary | ICD-10-CM | POA: Diagnosis not present

## 2016-08-10 DIAGNOSIS — I959 Hypotension, unspecified: Secondary | ICD-10-CM | POA: Diagnosis not present

## 2016-08-10 DIAGNOSIS — K5090 Crohn's disease, unspecified, without complications: Secondary | ICD-10-CM | POA: Diagnosis not present

## 2016-08-10 DIAGNOSIS — D696 Thrombocytopenia, unspecified: Secondary | ICD-10-CM | POA: Diagnosis not present

## 2016-08-10 DIAGNOSIS — G9340 Encephalopathy, unspecified: Secondary | ICD-10-CM | POA: Diagnosis not present

## 2016-08-10 DIAGNOSIS — J690 Pneumonitis due to inhalation of food and vomit: Secondary | ICD-10-CM | POA: Diagnosis not present

## 2016-08-10 DIAGNOSIS — J9600 Acute respiratory failure, unspecified whether with hypoxia or hypercapnia: Secondary | ICD-10-CM | POA: Diagnosis not present

## 2016-08-10 DIAGNOSIS — J90 Pleural effusion, not elsewhere classified: Secondary | ICD-10-CM | POA: Diagnosis not present

## 2016-08-10 DIAGNOSIS — M5137 Other intervertebral disc degeneration, lumbosacral region: Secondary | ICD-10-CM | POA: Diagnosis not present

## 2016-08-10 DIAGNOSIS — L02212 Cutaneous abscess of back [any part, except buttock]: Secondary | ICD-10-CM | POA: Diagnosis not present

## 2016-08-10 DIAGNOSIS — R569 Unspecified convulsions: Secondary | ICD-10-CM | POA: Diagnosis not present

## 2016-08-10 DIAGNOSIS — I82C12 Acute embolism and thrombosis of left internal jugular vein: Secondary | ICD-10-CM | POA: Diagnosis not present

## 2016-08-10 DIAGNOSIS — R001 Bradycardia, unspecified: Secondary | ICD-10-CM | POA: Diagnosis not present

## 2016-08-10 DIAGNOSIS — G92 Toxic encephalopathy: Secondary | ICD-10-CM | POA: Diagnosis not present

## 2016-08-10 DIAGNOSIS — M5124 Other intervertebral disc displacement, thoracic region: Secondary | ICD-10-CM | POA: Diagnosis not present

## 2016-08-10 DIAGNOSIS — D649 Anemia, unspecified: Secondary | ICD-10-CM | POA: Diagnosis not present

## 2016-08-10 DIAGNOSIS — Z452 Encounter for adjustment and management of vascular access device: Secondary | ICD-10-CM | POA: Diagnosis not present

## 2016-08-10 DIAGNOSIS — G8929 Other chronic pain: Secondary | ICD-10-CM | POA: Diagnosis not present

## 2016-08-10 DIAGNOSIS — E2740 Unspecified adrenocortical insufficiency: Secondary | ICD-10-CM | POA: Diagnosis not present

## 2016-08-10 DIAGNOSIS — E162 Hypoglycemia, unspecified: Secondary | ICD-10-CM | POA: Diagnosis not present

## 2016-08-10 DIAGNOSIS — I82C19 Acute embolism and thrombosis of unspecified internal jugular vein: Secondary | ICD-10-CM | POA: Diagnosis not present

## 2016-08-10 DIAGNOSIS — J15212 Pneumonia due to Methicillin resistant Staphylococcus aureus: Secondary | ICD-10-CM | POA: Diagnosis not present

## 2016-08-10 DIAGNOSIS — I469 Cardiac arrest, cause unspecified: Secondary | ICD-10-CM | POA: Diagnosis not present

## 2016-08-10 DIAGNOSIS — R918 Other nonspecific abnormal finding of lung field: Secondary | ICD-10-CM | POA: Diagnosis not present

## 2016-08-10 DIAGNOSIS — J984 Other disorders of lung: Secondary | ICD-10-CM | POA: Diagnosis not present

## 2016-08-10 DIAGNOSIS — J9601 Acute respiratory failure with hypoxia: Secondary | ICD-10-CM | POA: Diagnosis not present

## 2016-08-10 DIAGNOSIS — Z4682 Encounter for fitting and adjustment of non-vascular catheter: Secondary | ICD-10-CM | POA: Diagnosis not present

## 2016-08-10 DIAGNOSIS — R509 Fever, unspecified: Secondary | ICD-10-CM | POA: Diagnosis not present

## 2016-08-10 DIAGNOSIS — T82868A Thrombosis of vascular prosthetic devices, implants and grafts, initial encounter: Secondary | ICD-10-CM | POA: Diagnosis not present

## 2016-08-10 DIAGNOSIS — R0602 Shortness of breath: Secondary | ICD-10-CM | POA: Diagnosis not present

## 2016-08-10 DIAGNOSIS — E876 Hypokalemia: Secondary | ICD-10-CM | POA: Diagnosis not present

## 2016-08-10 DIAGNOSIS — J811 Chronic pulmonary edema: Secondary | ICD-10-CM | POA: Diagnosis not present

## 2016-08-10 DIAGNOSIS — J9811 Atelectasis: Secondary | ICD-10-CM | POA: Diagnosis not present

## 2016-08-10 DIAGNOSIS — R6521 Severe sepsis with septic shock: Secondary | ICD-10-CM | POA: Diagnosis not present

## 2016-08-10 DIAGNOSIS — A419 Sepsis, unspecified organism: Secondary | ICD-10-CM | POA: Diagnosis not present

## 2016-08-10 DIAGNOSIS — K50919 Crohn's disease, unspecified, with unspecified complications: Secondary | ICD-10-CM | POA: Diagnosis not present

## 2016-08-10 DIAGNOSIS — N390 Urinary tract infection, site not specified: Secondary | ICD-10-CM | POA: Diagnosis not present

## 2016-08-27 DIAGNOSIS — N390 Urinary tract infection, site not specified: Secondary | ICD-10-CM | POA: Diagnosis not present

## 2016-08-27 DIAGNOSIS — R399 Unspecified symptoms and signs involving the genitourinary system: Secondary | ICD-10-CM | POA: Diagnosis not present

## 2016-08-27 DIAGNOSIS — G894 Chronic pain syndrome: Secondary | ICD-10-CM | POA: Diagnosis not present

## 2016-08-27 DIAGNOSIS — Z5181 Encounter for therapeutic drug level monitoring: Secondary | ICD-10-CM | POA: Diagnosis not present

## 2016-08-31 DIAGNOSIS — S0990XA Unspecified injury of head, initial encounter: Secondary | ICD-10-CM | POA: Diagnosis not present

## 2016-08-31 DIAGNOSIS — S39012A Strain of muscle, fascia and tendon of lower back, initial encounter: Secondary | ICD-10-CM | POA: Diagnosis not present

## 2016-08-31 DIAGNOSIS — Z886 Allergy status to analgesic agent status: Secondary | ICD-10-CM | POA: Diagnosis not present

## 2016-08-31 DIAGNOSIS — Z79899 Other long term (current) drug therapy: Secondary | ICD-10-CM | POA: Diagnosis not present

## 2016-08-31 DIAGNOSIS — S3992XA Unspecified injury of lower back, initial encounter: Secondary | ICD-10-CM | POA: Diagnosis not present

## 2016-08-31 DIAGNOSIS — M542 Cervicalgia: Secondary | ICD-10-CM | POA: Diagnosis not present

## 2016-08-31 DIAGNOSIS — Z885 Allergy status to narcotic agent status: Secondary | ICD-10-CM | POA: Diagnosis not present

## 2016-08-31 DIAGNOSIS — R05 Cough: Secondary | ICD-10-CM | POA: Diagnosis not present

## 2016-08-31 DIAGNOSIS — G8929 Other chronic pain: Secondary | ICD-10-CM | POA: Diagnosis not present

## 2016-08-31 DIAGNOSIS — R918 Other nonspecific abnormal finding of lung field: Secondary | ICD-10-CM | POA: Diagnosis not present

## 2016-08-31 DIAGNOSIS — Z791 Long term (current) use of non-steroidal anti-inflammatories (NSAID): Secondary | ICD-10-CM | POA: Diagnosis not present

## 2016-08-31 DIAGNOSIS — G894 Chronic pain syndrome: Secondary | ICD-10-CM | POA: Diagnosis not present

## 2016-08-31 DIAGNOSIS — J189 Pneumonia, unspecified organism: Secondary | ICD-10-CM | POA: Diagnosis not present

## 2016-09-01 DIAGNOSIS — S0990XA Unspecified injury of head, initial encounter: Secondary | ICD-10-CM | POA: Diagnosis not present

## 2016-09-01 DIAGNOSIS — S3992XA Unspecified injury of lower back, initial encounter: Secondary | ICD-10-CM | POA: Diagnosis not present

## 2016-09-01 DIAGNOSIS — R05 Cough: Secondary | ICD-10-CM | POA: Diagnosis not present

## 2016-09-01 DIAGNOSIS — J189 Pneumonia, unspecified organism: Secondary | ICD-10-CM | POA: Diagnosis not present

## 2016-09-01 DIAGNOSIS — M542 Cervicalgia: Secondary | ICD-10-CM | POA: Diagnosis not present

## 2016-09-10 DIAGNOSIS — R399 Unspecified symptoms and signs involving the genitourinary system: Secondary | ICD-10-CM | POA: Diagnosis not present

## 2016-09-10 DIAGNOSIS — G894 Chronic pain syndrome: Secondary | ICD-10-CM | POA: Diagnosis not present

## 2016-09-10 DIAGNOSIS — R609 Edema, unspecified: Secondary | ICD-10-CM | POA: Diagnosis not present

## 2016-09-12 DIAGNOSIS — Z888 Allergy status to other drugs, medicaments and biological substances status: Secondary | ICD-10-CM | POA: Diagnosis not present

## 2016-09-12 DIAGNOSIS — R0781 Pleurodynia: Secondary | ICD-10-CM | POA: Diagnosis not present

## 2016-09-12 DIAGNOSIS — R600 Localized edema: Secondary | ICD-10-CM | POA: Diagnosis not present

## 2016-09-12 DIAGNOSIS — R791 Abnormal coagulation profile: Secondary | ICD-10-CM | POA: Diagnosis not present

## 2016-09-12 DIAGNOSIS — D72829 Elevated white blood cell count, unspecified: Secondary | ICD-10-CM | POA: Diagnosis not present

## 2016-09-12 DIAGNOSIS — R079 Chest pain, unspecified: Secondary | ICD-10-CM | POA: Diagnosis not present

## 2016-09-12 DIAGNOSIS — Z886 Allergy status to analgesic agent status: Secondary | ICD-10-CM | POA: Diagnosis not present

## 2016-09-12 DIAGNOSIS — R072 Precordial pain: Secondary | ICD-10-CM | POA: Diagnosis not present

## 2016-09-12 DIAGNOSIS — Z79891 Long term (current) use of opiate analgesic: Secondary | ICD-10-CM | POA: Diagnosis not present

## 2016-09-12 DIAGNOSIS — Z79899 Other long term (current) drug therapy: Secondary | ICD-10-CM | POA: Diagnosis not present

## 2016-09-12 DIAGNOSIS — R918 Other nonspecific abnormal finding of lung field: Secondary | ICD-10-CM | POA: Diagnosis not present

## 2016-09-12 DIAGNOSIS — K5090 Crohn's disease, unspecified, without complications: Secondary | ICD-10-CM | POA: Diagnosis not present

## 2016-09-12 DIAGNOSIS — R7989 Other specified abnormal findings of blood chemistry: Secondary | ICD-10-CM | POA: Diagnosis not present

## 2016-09-13 DIAGNOSIS — R079 Chest pain, unspecified: Secondary | ICD-10-CM | POA: Diagnosis not present

## 2016-09-13 DIAGNOSIS — R791 Abnormal coagulation profile: Secondary | ICD-10-CM | POA: Diagnosis not present

## 2016-09-13 DIAGNOSIS — R0781 Pleurodynia: Secondary | ICD-10-CM | POA: Diagnosis not present

## 2016-09-13 DIAGNOSIS — Z0189 Encounter for other specified special examinations: Secondary | ICD-10-CM | POA: Diagnosis not present

## 2016-09-13 DIAGNOSIS — R748 Abnormal levels of other serum enzymes: Secondary | ICD-10-CM | POA: Diagnosis not present

## 2016-09-13 DIAGNOSIS — I10 Essential (primary) hypertension: Secondary | ICD-10-CM | POA: Diagnosis not present

## 2016-09-14 DIAGNOSIS — R918 Other nonspecific abnormal finding of lung field: Secondary | ICD-10-CM | POA: Diagnosis not present

## 2016-09-14 DIAGNOSIS — J189 Pneumonia, unspecified organism: Secondary | ICD-10-CM | POA: Diagnosis not present

## 2016-09-14 DIAGNOSIS — R0781 Pleurodynia: Secondary | ICD-10-CM | POA: Diagnosis not present

## 2016-09-14 DIAGNOSIS — R748 Abnormal levels of other serum enzymes: Secondary | ICD-10-CM | POA: Diagnosis not present

## 2016-09-14 DIAGNOSIS — R079 Chest pain, unspecified: Secondary | ICD-10-CM | POA: Diagnosis not present

## 2016-09-29 DIAGNOSIS — G894 Chronic pain syndrome: Secondary | ICD-10-CM | POA: Diagnosis not present

## 2016-09-29 DIAGNOSIS — R609 Edema, unspecified: Secondary | ICD-10-CM | POA: Diagnosis not present

## 2016-09-29 DIAGNOSIS — K5090 Crohn's disease, unspecified, without complications: Secondary | ICD-10-CM | POA: Diagnosis not present

## 2016-09-29 DIAGNOSIS — M545 Low back pain: Secondary | ICD-10-CM | POA: Diagnosis not present

## 2016-09-29 DIAGNOSIS — Z7952 Long term (current) use of systemic steroids: Secondary | ICD-10-CM | POA: Diagnosis not present

## 2016-09-29 DIAGNOSIS — Z79891 Long term (current) use of opiate analgesic: Secondary | ICD-10-CM | POA: Diagnosis not present

## 2016-09-29 DIAGNOSIS — K219 Gastro-esophageal reflux disease without esophagitis: Secondary | ICD-10-CM | POA: Diagnosis not present

## 2016-10-01 DIAGNOSIS — K219 Gastro-esophageal reflux disease without esophagitis: Secondary | ICD-10-CM | POA: Diagnosis not present

## 2016-10-01 DIAGNOSIS — R609 Edema, unspecified: Secondary | ICD-10-CM | POA: Diagnosis not present

## 2016-10-01 DIAGNOSIS — K5090 Crohn's disease, unspecified, without complications: Secondary | ICD-10-CM | POA: Diagnosis not present

## 2016-10-01 DIAGNOSIS — M545 Low back pain: Secondary | ICD-10-CM | POA: Diagnosis not present

## 2016-10-01 DIAGNOSIS — M79671 Pain in right foot: Secondary | ICD-10-CM | POA: Diagnosis not present

## 2016-10-01 DIAGNOSIS — Z7952 Long term (current) use of systemic steroids: Secondary | ICD-10-CM | POA: Diagnosis not present

## 2016-10-01 DIAGNOSIS — G894 Chronic pain syndrome: Secondary | ICD-10-CM | POA: Diagnosis not present

## 2016-10-01 DIAGNOSIS — G8929 Other chronic pain: Secondary | ICD-10-CM | POA: Diagnosis not present

## 2016-10-01 DIAGNOSIS — R7303 Prediabetes: Secondary | ICD-10-CM | POA: Diagnosis not present

## 2016-10-01 DIAGNOSIS — N390 Urinary tract infection, site not specified: Secondary | ICD-10-CM | POA: Diagnosis not present

## 2016-10-01 DIAGNOSIS — Z79891 Long term (current) use of opiate analgesic: Secondary | ICD-10-CM | POA: Diagnosis not present

## 2016-10-01 DIAGNOSIS — I10 Essential (primary) hypertension: Secondary | ICD-10-CM | POA: Diagnosis not present

## 2016-10-01 DIAGNOSIS — M7989 Other specified soft tissue disorders: Secondary | ICD-10-CM | POA: Diagnosis not present

## 2016-10-03 DIAGNOSIS — R609 Edema, unspecified: Secondary | ICD-10-CM | POA: Diagnosis not present

## 2016-10-03 DIAGNOSIS — Z7952 Long term (current) use of systemic steroids: Secondary | ICD-10-CM | POA: Diagnosis not present

## 2016-10-03 DIAGNOSIS — Z79891 Long term (current) use of opiate analgesic: Secondary | ICD-10-CM | POA: Diagnosis not present

## 2016-10-03 DIAGNOSIS — M545 Low back pain: Secondary | ICD-10-CM | POA: Diagnosis not present

## 2016-10-03 DIAGNOSIS — G894 Chronic pain syndrome: Secondary | ICD-10-CM | POA: Diagnosis not present

## 2016-10-03 DIAGNOSIS — K5090 Crohn's disease, unspecified, without complications: Secondary | ICD-10-CM | POA: Diagnosis not present

## 2016-10-03 DIAGNOSIS — K219 Gastro-esophageal reflux disease without esophagitis: Secondary | ICD-10-CM | POA: Diagnosis not present

## 2016-10-04 DIAGNOSIS — K219 Gastro-esophageal reflux disease without esophagitis: Secondary | ICD-10-CM | POA: Diagnosis not present

## 2016-10-04 DIAGNOSIS — Z79891 Long term (current) use of opiate analgesic: Secondary | ICD-10-CM | POA: Diagnosis not present

## 2016-10-04 DIAGNOSIS — M545 Low back pain: Secondary | ICD-10-CM | POA: Diagnosis not present

## 2016-10-04 DIAGNOSIS — R609 Edema, unspecified: Secondary | ICD-10-CM | POA: Diagnosis not present

## 2016-10-04 DIAGNOSIS — G894 Chronic pain syndrome: Secondary | ICD-10-CM | POA: Diagnosis not present

## 2016-10-04 DIAGNOSIS — Z7952 Long term (current) use of systemic steroids: Secondary | ICD-10-CM | POA: Diagnosis not present

## 2016-10-04 DIAGNOSIS — K5090 Crohn's disease, unspecified, without complications: Secondary | ICD-10-CM | POA: Diagnosis not present

## 2016-10-05 DIAGNOSIS — K219 Gastro-esophageal reflux disease without esophagitis: Secondary | ICD-10-CM | POA: Diagnosis not present

## 2016-10-05 DIAGNOSIS — Z7952 Long term (current) use of systemic steroids: Secondary | ICD-10-CM | POA: Diagnosis not present

## 2016-10-05 DIAGNOSIS — R609 Edema, unspecified: Secondary | ICD-10-CM | POA: Diagnosis not present

## 2016-10-05 DIAGNOSIS — M545 Low back pain: Secondary | ICD-10-CM | POA: Diagnosis not present

## 2016-10-05 DIAGNOSIS — G894 Chronic pain syndrome: Secondary | ICD-10-CM | POA: Diagnosis not present

## 2016-10-05 DIAGNOSIS — Z79891 Long term (current) use of opiate analgesic: Secondary | ICD-10-CM | POA: Diagnosis not present

## 2016-10-05 DIAGNOSIS — K5090 Crohn's disease, unspecified, without complications: Secondary | ICD-10-CM | POA: Diagnosis not present

## 2016-10-09 DIAGNOSIS — K219 Gastro-esophageal reflux disease without esophagitis: Secondary | ICD-10-CM | POA: Diagnosis not present

## 2016-10-09 DIAGNOSIS — K5090 Crohn's disease, unspecified, without complications: Secondary | ICD-10-CM | POA: Diagnosis not present

## 2016-10-09 DIAGNOSIS — R609 Edema, unspecified: Secondary | ICD-10-CM | POA: Diagnosis not present

## 2016-10-09 DIAGNOSIS — Z7952 Long term (current) use of systemic steroids: Secondary | ICD-10-CM | POA: Diagnosis not present

## 2016-10-09 DIAGNOSIS — G894 Chronic pain syndrome: Secondary | ICD-10-CM | POA: Diagnosis not present

## 2016-10-09 DIAGNOSIS — Z79891 Long term (current) use of opiate analgesic: Secondary | ICD-10-CM | POA: Diagnosis not present

## 2016-10-09 DIAGNOSIS — M545 Low back pain: Secondary | ICD-10-CM | POA: Diagnosis not present

## 2016-10-15 DIAGNOSIS — K219 Gastro-esophageal reflux disease without esophagitis: Secondary | ICD-10-CM | POA: Diagnosis not present

## 2016-10-15 DIAGNOSIS — K5090 Crohn's disease, unspecified, without complications: Secondary | ICD-10-CM | POA: Diagnosis not present

## 2016-10-15 DIAGNOSIS — R609 Edema, unspecified: Secondary | ICD-10-CM | POA: Diagnosis not present

## 2016-10-15 DIAGNOSIS — G894 Chronic pain syndrome: Secondary | ICD-10-CM | POA: Diagnosis not present

## 2016-10-15 DIAGNOSIS — Z79891 Long term (current) use of opiate analgesic: Secondary | ICD-10-CM | POA: Diagnosis not present

## 2016-10-15 DIAGNOSIS — Z7952 Long term (current) use of systemic steroids: Secondary | ICD-10-CM | POA: Diagnosis not present

## 2016-10-15 DIAGNOSIS — M545 Low back pain: Secondary | ICD-10-CM | POA: Diagnosis not present

## 2016-10-18 DIAGNOSIS — K5090 Crohn's disease, unspecified, without complications: Secondary | ICD-10-CM | POA: Diagnosis not present

## 2016-10-18 DIAGNOSIS — G894 Chronic pain syndrome: Secondary | ICD-10-CM | POA: Diagnosis not present

## 2016-10-18 DIAGNOSIS — R609 Edema, unspecified: Secondary | ICD-10-CM | POA: Diagnosis not present

## 2016-10-18 DIAGNOSIS — Z7952 Long term (current) use of systemic steroids: Secondary | ICD-10-CM | POA: Diagnosis not present

## 2016-10-18 DIAGNOSIS — M545 Low back pain: Secondary | ICD-10-CM | POA: Diagnosis not present

## 2016-10-18 DIAGNOSIS — K219 Gastro-esophageal reflux disease without esophagitis: Secondary | ICD-10-CM | POA: Diagnosis not present

## 2016-10-18 DIAGNOSIS — Z79891 Long term (current) use of opiate analgesic: Secondary | ICD-10-CM | POA: Diagnosis not present

## 2016-10-19 DIAGNOSIS — R609 Edema, unspecified: Secondary | ICD-10-CM | POA: Diagnosis not present

## 2016-10-19 DIAGNOSIS — K219 Gastro-esophageal reflux disease without esophagitis: Secondary | ICD-10-CM | POA: Diagnosis not present

## 2016-10-19 DIAGNOSIS — M545 Low back pain: Secondary | ICD-10-CM | POA: Diagnosis not present

## 2016-10-19 DIAGNOSIS — Z79891 Long term (current) use of opiate analgesic: Secondary | ICD-10-CM | POA: Diagnosis not present

## 2016-10-19 DIAGNOSIS — K5090 Crohn's disease, unspecified, without complications: Secondary | ICD-10-CM | POA: Diagnosis not present

## 2016-10-19 DIAGNOSIS — G894 Chronic pain syndrome: Secondary | ICD-10-CM | POA: Diagnosis not present

## 2016-10-19 DIAGNOSIS — Z7952 Long term (current) use of systemic steroids: Secondary | ICD-10-CM | POA: Diagnosis not present

## 2016-10-20 DIAGNOSIS — K5090 Crohn's disease, unspecified, without complications: Secondary | ICD-10-CM | POA: Diagnosis not present

## 2016-10-20 DIAGNOSIS — M545 Low back pain: Secondary | ICD-10-CM | POA: Diagnosis not present

## 2016-10-20 DIAGNOSIS — G894 Chronic pain syndrome: Secondary | ICD-10-CM | POA: Diagnosis not present

## 2016-10-20 DIAGNOSIS — Z7952 Long term (current) use of systemic steroids: Secondary | ICD-10-CM | POA: Diagnosis not present

## 2016-10-20 DIAGNOSIS — R609 Edema, unspecified: Secondary | ICD-10-CM | POA: Diagnosis not present

## 2016-10-20 DIAGNOSIS — Z79891 Long term (current) use of opiate analgesic: Secondary | ICD-10-CM | POA: Diagnosis not present

## 2016-10-20 DIAGNOSIS — K219 Gastro-esophageal reflux disease without esophagitis: Secondary | ICD-10-CM | POA: Diagnosis not present

## 2016-10-24 DIAGNOSIS — Z79891 Long term (current) use of opiate analgesic: Secondary | ICD-10-CM | POA: Diagnosis not present

## 2016-10-24 DIAGNOSIS — G894 Chronic pain syndrome: Secondary | ICD-10-CM | POA: Diagnosis not present

## 2016-10-24 DIAGNOSIS — Z7952 Long term (current) use of systemic steroids: Secondary | ICD-10-CM | POA: Diagnosis not present

## 2016-10-24 DIAGNOSIS — R609 Edema, unspecified: Secondary | ICD-10-CM | POA: Diagnosis not present

## 2016-10-24 DIAGNOSIS — M545 Low back pain: Secondary | ICD-10-CM | POA: Diagnosis not present

## 2016-10-24 DIAGNOSIS — K219 Gastro-esophageal reflux disease without esophagitis: Secondary | ICD-10-CM | POA: Diagnosis not present

## 2016-10-24 DIAGNOSIS — K5090 Crohn's disease, unspecified, without complications: Secondary | ICD-10-CM | POA: Diagnosis not present

## 2016-10-25 DIAGNOSIS — K5090 Crohn's disease, unspecified, without complications: Secondary | ICD-10-CM | POA: Diagnosis not present

## 2016-10-25 DIAGNOSIS — Z79891 Long term (current) use of opiate analgesic: Secondary | ICD-10-CM | POA: Diagnosis not present

## 2016-10-25 DIAGNOSIS — M545 Low back pain: Secondary | ICD-10-CM | POA: Diagnosis not present

## 2016-10-25 DIAGNOSIS — R609 Edema, unspecified: Secondary | ICD-10-CM | POA: Diagnosis not present

## 2016-10-25 DIAGNOSIS — Z7952 Long term (current) use of systemic steroids: Secondary | ICD-10-CM | POA: Diagnosis not present

## 2016-10-25 DIAGNOSIS — G894 Chronic pain syndrome: Secondary | ICD-10-CM | POA: Diagnosis not present

## 2016-10-25 DIAGNOSIS — K219 Gastro-esophageal reflux disease without esophagitis: Secondary | ICD-10-CM | POA: Diagnosis not present

## 2016-10-29 DIAGNOSIS — Z79899 Other long term (current) drug therapy: Secondary | ICD-10-CM | POA: Diagnosis not present

## 2016-10-29 DIAGNOSIS — Z888 Allergy status to other drugs, medicaments and biological substances status: Secondary | ICD-10-CM | POA: Diagnosis not present

## 2016-10-29 DIAGNOSIS — M79604 Pain in right leg: Secondary | ICD-10-CM | POA: Diagnosis not present

## 2016-10-29 DIAGNOSIS — R600 Localized edema: Secondary | ICD-10-CM | POA: Diagnosis not present

## 2016-10-29 DIAGNOSIS — T148XXA Other injury of unspecified body region, initial encounter: Secondary | ICD-10-CM | POA: Diagnosis not present

## 2016-10-29 DIAGNOSIS — S8991XA Unspecified injury of right lower leg, initial encounter: Secondary | ICD-10-CM | POA: Diagnosis not present

## 2016-10-29 DIAGNOSIS — S80821A Blister (nonthermal), right lower leg, initial encounter: Secondary | ICD-10-CM | POA: Diagnosis not present

## 2016-10-29 DIAGNOSIS — R2241 Localized swelling, mass and lump, right lower limb: Secondary | ICD-10-CM | POA: Diagnosis not present

## 2016-10-29 DIAGNOSIS — M79662 Pain in left lower leg: Secondary | ICD-10-CM | POA: Diagnosis not present

## 2016-10-29 DIAGNOSIS — Z886 Allergy status to analgesic agent status: Secondary | ICD-10-CM | POA: Diagnosis not present

## 2016-10-29 DIAGNOSIS — K5090 Crohn's disease, unspecified, without complications: Secondary | ICD-10-CM | POA: Diagnosis not present

## 2016-11-01 DIAGNOSIS — G894 Chronic pain syndrome: Secondary | ICD-10-CM | POA: Diagnosis not present

## 2016-11-01 DIAGNOSIS — K219 Gastro-esophageal reflux disease without esophagitis: Secondary | ICD-10-CM | POA: Diagnosis not present

## 2016-11-01 DIAGNOSIS — Z79891 Long term (current) use of opiate analgesic: Secondary | ICD-10-CM | POA: Diagnosis not present

## 2016-11-01 DIAGNOSIS — R609 Edema, unspecified: Secondary | ICD-10-CM | POA: Diagnosis not present

## 2016-11-01 DIAGNOSIS — M545 Low back pain: Secondary | ICD-10-CM | POA: Diagnosis not present

## 2016-11-01 DIAGNOSIS — K5090 Crohn's disease, unspecified, without complications: Secondary | ICD-10-CM | POA: Diagnosis not present

## 2016-11-01 DIAGNOSIS — Z7952 Long term (current) use of systemic steroids: Secondary | ICD-10-CM | POA: Diagnosis not present

## 2016-11-02 DIAGNOSIS — G894 Chronic pain syndrome: Secondary | ICD-10-CM | POA: Diagnosis not present

## 2016-11-02 DIAGNOSIS — K5090 Crohn's disease, unspecified, without complications: Secondary | ICD-10-CM | POA: Diagnosis not present

## 2016-11-02 DIAGNOSIS — K219 Gastro-esophageal reflux disease without esophagitis: Secondary | ICD-10-CM | POA: Diagnosis not present

## 2016-11-02 DIAGNOSIS — R609 Edema, unspecified: Secondary | ICD-10-CM | POA: Diagnosis not present

## 2016-11-02 DIAGNOSIS — Z79891 Long term (current) use of opiate analgesic: Secondary | ICD-10-CM | POA: Diagnosis not present

## 2016-11-02 DIAGNOSIS — Z7952 Long term (current) use of systemic steroids: Secondary | ICD-10-CM | POA: Diagnosis not present

## 2016-11-02 DIAGNOSIS — M545 Low back pain: Secondary | ICD-10-CM | POA: Diagnosis not present

## 2016-11-06 DIAGNOSIS — M545 Low back pain: Secondary | ICD-10-CM | POA: Diagnosis not present

## 2016-11-06 DIAGNOSIS — G894 Chronic pain syndrome: Secondary | ICD-10-CM | POA: Diagnosis not present

## 2016-11-06 DIAGNOSIS — K5090 Crohn's disease, unspecified, without complications: Secondary | ICD-10-CM | POA: Diagnosis not present

## 2016-11-06 DIAGNOSIS — K219 Gastro-esophageal reflux disease without esophagitis: Secondary | ICD-10-CM | POA: Diagnosis not present

## 2016-11-06 DIAGNOSIS — Z7952 Long term (current) use of systemic steroids: Secondary | ICD-10-CM | POA: Diagnosis not present

## 2016-11-06 DIAGNOSIS — R609 Edema, unspecified: Secondary | ICD-10-CM | POA: Diagnosis not present

## 2016-11-06 DIAGNOSIS — Z79891 Long term (current) use of opiate analgesic: Secondary | ICD-10-CM | POA: Diagnosis not present

## 2016-11-08 DIAGNOSIS — K219 Gastro-esophageal reflux disease without esophagitis: Secondary | ICD-10-CM | POA: Diagnosis not present

## 2016-11-08 DIAGNOSIS — Z79891 Long term (current) use of opiate analgesic: Secondary | ICD-10-CM | POA: Diagnosis not present

## 2016-11-08 DIAGNOSIS — R609 Edema, unspecified: Secondary | ICD-10-CM | POA: Diagnosis not present

## 2016-11-08 DIAGNOSIS — K5090 Crohn's disease, unspecified, without complications: Secondary | ICD-10-CM | POA: Diagnosis not present

## 2016-11-08 DIAGNOSIS — G894 Chronic pain syndrome: Secondary | ICD-10-CM | POA: Diagnosis not present

## 2016-11-08 DIAGNOSIS — M545 Low back pain: Secondary | ICD-10-CM | POA: Diagnosis not present

## 2016-11-08 DIAGNOSIS — Z7952 Long term (current) use of systemic steroids: Secondary | ICD-10-CM | POA: Diagnosis not present

## 2016-11-09 DIAGNOSIS — Z79891 Long term (current) use of opiate analgesic: Secondary | ICD-10-CM | POA: Diagnosis not present

## 2016-11-09 DIAGNOSIS — G629 Polyneuropathy, unspecified: Secondary | ICD-10-CM | POA: Diagnosis not present

## 2016-11-09 DIAGNOSIS — Z23 Encounter for immunization: Secondary | ICD-10-CM | POA: Diagnosis not present

## 2016-11-09 DIAGNOSIS — K5090 Crohn's disease, unspecified, without complications: Secondary | ICD-10-CM | POA: Diagnosis not present

## 2016-11-09 DIAGNOSIS — R609 Edema, unspecified: Secondary | ICD-10-CM | POA: Diagnosis not present

## 2016-11-09 DIAGNOSIS — G43909 Migraine, unspecified, not intractable, without status migrainosus: Secondary | ICD-10-CM | POA: Diagnosis not present

## 2016-11-09 DIAGNOSIS — M549 Dorsalgia, unspecified: Secondary | ICD-10-CM | POA: Diagnosis not present

## 2016-11-09 DIAGNOSIS — Z7952 Long term (current) use of systemic steroids: Secondary | ICD-10-CM | POA: Diagnosis not present

## 2016-11-09 DIAGNOSIS — G894 Chronic pain syndrome: Secondary | ICD-10-CM | POA: Diagnosis not present

## 2016-11-09 DIAGNOSIS — M545 Low back pain: Secondary | ICD-10-CM | POA: Diagnosis not present

## 2016-11-09 DIAGNOSIS — M25569 Pain in unspecified knee: Secondary | ICD-10-CM | POA: Diagnosis not present

## 2016-11-09 DIAGNOSIS — K219 Gastro-esophageal reflux disease without esophagitis: Secondary | ICD-10-CM | POA: Diagnosis not present

## 2016-11-14 DIAGNOSIS — G894 Chronic pain syndrome: Secondary | ICD-10-CM | POA: Diagnosis not present

## 2016-11-14 DIAGNOSIS — K5090 Crohn's disease, unspecified, without complications: Secondary | ICD-10-CM | POA: Diagnosis not present

## 2016-11-14 DIAGNOSIS — Z7952 Long term (current) use of systemic steroids: Secondary | ICD-10-CM | POA: Diagnosis not present

## 2016-11-14 DIAGNOSIS — Z79891 Long term (current) use of opiate analgesic: Secondary | ICD-10-CM | POA: Diagnosis not present

## 2016-11-14 DIAGNOSIS — K219 Gastro-esophageal reflux disease without esophagitis: Secondary | ICD-10-CM | POA: Diagnosis not present

## 2016-11-14 DIAGNOSIS — M545 Low back pain: Secondary | ICD-10-CM | POA: Diagnosis not present

## 2016-11-14 DIAGNOSIS — R609 Edema, unspecified: Secondary | ICD-10-CM | POA: Diagnosis not present

## 2016-11-15 DIAGNOSIS — G894 Chronic pain syndrome: Secondary | ICD-10-CM | POA: Diagnosis not present

## 2016-11-15 DIAGNOSIS — Z7952 Long term (current) use of systemic steroids: Secondary | ICD-10-CM | POA: Diagnosis not present

## 2016-11-15 DIAGNOSIS — K5090 Crohn's disease, unspecified, without complications: Secondary | ICD-10-CM | POA: Diagnosis not present

## 2016-11-15 DIAGNOSIS — K219 Gastro-esophageal reflux disease without esophagitis: Secondary | ICD-10-CM | POA: Diagnosis not present

## 2016-11-15 DIAGNOSIS — R609 Edema, unspecified: Secondary | ICD-10-CM | POA: Diagnosis not present

## 2016-11-15 DIAGNOSIS — M545 Low back pain: Secondary | ICD-10-CM | POA: Diagnosis not present

## 2016-11-15 DIAGNOSIS — Z79891 Long term (current) use of opiate analgesic: Secondary | ICD-10-CM | POA: Diagnosis not present

## 2016-11-19 DIAGNOSIS — G894 Chronic pain syndrome: Secondary | ICD-10-CM | POA: Diagnosis not present

## 2016-11-19 DIAGNOSIS — Z7952 Long term (current) use of systemic steroids: Secondary | ICD-10-CM | POA: Diagnosis not present

## 2016-11-19 DIAGNOSIS — M545 Low back pain: Secondary | ICD-10-CM | POA: Diagnosis not present

## 2016-11-19 DIAGNOSIS — K5090 Crohn's disease, unspecified, without complications: Secondary | ICD-10-CM | POA: Diagnosis not present

## 2016-11-19 DIAGNOSIS — R609 Edema, unspecified: Secondary | ICD-10-CM | POA: Diagnosis not present

## 2016-11-19 DIAGNOSIS — K219 Gastro-esophageal reflux disease without esophagitis: Secondary | ICD-10-CM | POA: Diagnosis not present

## 2016-11-19 DIAGNOSIS — Z79891 Long term (current) use of opiate analgesic: Secondary | ICD-10-CM | POA: Diagnosis not present

## 2016-11-21 DIAGNOSIS — K5090 Crohn's disease, unspecified, without complications: Secondary | ICD-10-CM | POA: Diagnosis not present

## 2016-11-21 DIAGNOSIS — Z79891 Long term (current) use of opiate analgesic: Secondary | ICD-10-CM | POA: Diagnosis not present

## 2016-11-21 DIAGNOSIS — G894 Chronic pain syndrome: Secondary | ICD-10-CM | POA: Diagnosis not present

## 2016-11-21 DIAGNOSIS — K219 Gastro-esophageal reflux disease without esophagitis: Secondary | ICD-10-CM | POA: Diagnosis not present

## 2016-11-21 DIAGNOSIS — M545 Low back pain: Secondary | ICD-10-CM | POA: Diagnosis not present

## 2016-11-21 DIAGNOSIS — Z7952 Long term (current) use of systemic steroids: Secondary | ICD-10-CM | POA: Diagnosis not present

## 2016-11-21 DIAGNOSIS — R609 Edema, unspecified: Secondary | ICD-10-CM | POA: Diagnosis not present

## 2016-11-21 DIAGNOSIS — G8929 Other chronic pain: Secondary | ICD-10-CM | POA: Diagnosis not present

## 2016-11-24 DIAGNOSIS — G894 Chronic pain syndrome: Secondary | ICD-10-CM | POA: Diagnosis not present

## 2016-11-24 DIAGNOSIS — Z79891 Long term (current) use of opiate analgesic: Secondary | ICD-10-CM | POA: Diagnosis not present

## 2016-11-24 DIAGNOSIS — K219 Gastro-esophageal reflux disease without esophagitis: Secondary | ICD-10-CM | POA: Diagnosis not present

## 2016-11-24 DIAGNOSIS — K5090 Crohn's disease, unspecified, without complications: Secondary | ICD-10-CM | POA: Diagnosis not present

## 2016-11-24 DIAGNOSIS — M545 Low back pain: Secondary | ICD-10-CM | POA: Diagnosis not present

## 2016-11-24 DIAGNOSIS — R609 Edema, unspecified: Secondary | ICD-10-CM | POA: Diagnosis not present

## 2016-11-24 DIAGNOSIS — Z7952 Long term (current) use of systemic steroids: Secondary | ICD-10-CM | POA: Diagnosis not present

## 2016-11-26 DIAGNOSIS — M549 Dorsalgia, unspecified: Secondary | ICD-10-CM | POA: Diagnosis not present

## 2016-11-26 DIAGNOSIS — N390 Urinary tract infection, site not specified: Secondary | ICD-10-CM | POA: Diagnosis not present

## 2017-01-09 DIAGNOSIS — Z5181 Encounter for therapeutic drug level monitoring: Secondary | ICD-10-CM | POA: Diagnosis not present

## 2017-01-09 DIAGNOSIS — R05 Cough: Secondary | ICD-10-CM | POA: Diagnosis not present

## 2017-01-09 DIAGNOSIS — I10 Essential (primary) hypertension: Secondary | ICD-10-CM | POA: Diagnosis not present

## 2017-01-09 DIAGNOSIS — G894 Chronic pain syndrome: Secondary | ICD-10-CM | POA: Diagnosis not present

## 2017-01-13 DIAGNOSIS — S8992XA Unspecified injury of left lower leg, initial encounter: Secondary | ICD-10-CM | POA: Diagnosis not present

## 2017-01-13 DIAGNOSIS — S79912A Unspecified injury of left hip, initial encounter: Secondary | ICD-10-CM | POA: Diagnosis not present

## 2017-01-13 DIAGNOSIS — Z79899 Other long term (current) drug therapy: Secondary | ICD-10-CM | POA: Diagnosis not present

## 2017-01-13 DIAGNOSIS — R0989 Other specified symptoms and signs involving the circulatory and respiratory systems: Secondary | ICD-10-CM | POA: Diagnosis not present

## 2017-01-13 DIAGNOSIS — S99922A Unspecified injury of left foot, initial encounter: Secondary | ICD-10-CM | POA: Diagnosis not present

## 2017-01-13 DIAGNOSIS — Z885 Allergy status to narcotic agent status: Secondary | ICD-10-CM | POA: Diagnosis not present

## 2017-01-13 DIAGNOSIS — J019 Acute sinusitis, unspecified: Secondary | ICD-10-CM | POA: Diagnosis not present

## 2017-01-13 DIAGNOSIS — R05 Cough: Secondary | ICD-10-CM | POA: Diagnosis not present

## 2017-01-13 DIAGNOSIS — S8991XA Unspecified injury of right lower leg, initial encounter: Secondary | ICD-10-CM | POA: Diagnosis not present

## 2017-01-13 DIAGNOSIS — Z9181 History of falling: Secondary | ICD-10-CM | POA: Diagnosis not present

## 2017-01-13 DIAGNOSIS — K5090 Crohn's disease, unspecified, without complications: Secondary | ICD-10-CM | POA: Diagnosis not present

## 2017-01-13 DIAGNOSIS — S3992XA Unspecified injury of lower back, initial encounter: Secondary | ICD-10-CM | POA: Diagnosis not present

## 2017-01-13 DIAGNOSIS — W010XXA Fall on same level from slipping, tripping and stumbling without subsequent striking against object, initial encounter: Secondary | ICD-10-CM | POA: Diagnosis not present

## 2017-01-13 DIAGNOSIS — H65191 Other acute nonsuppurative otitis media, right ear: Secondary | ICD-10-CM | POA: Diagnosis not present

## 2017-01-13 DIAGNOSIS — S79911A Unspecified injury of right hip, initial encounter: Secondary | ICD-10-CM | POA: Diagnosis not present

## 2017-01-13 DIAGNOSIS — J329 Chronic sinusitis, unspecified: Secondary | ICD-10-CM | POA: Diagnosis not present

## 2017-01-13 DIAGNOSIS — S99921A Unspecified injury of right foot, initial encounter: Secondary | ICD-10-CM | POA: Diagnosis not present

## 2017-01-13 DIAGNOSIS — Z886 Allergy status to analgesic agent status: Secondary | ICD-10-CM | POA: Diagnosis not present

## 2017-02-07 DIAGNOSIS — Z79899 Other long term (current) drug therapy: Secondary | ICD-10-CM | POA: Diagnosis not present

## 2017-02-07 DIAGNOSIS — G894 Chronic pain syndrome: Secondary | ICD-10-CM | POA: Diagnosis not present

## 2017-02-07 DIAGNOSIS — Z5181 Encounter for therapeutic drug level monitoring: Secondary | ICD-10-CM | POA: Diagnosis not present

## 2017-02-07 DIAGNOSIS — I10 Essential (primary) hypertension: Secondary | ICD-10-CM | POA: Diagnosis not present

## 2017-02-28 DIAGNOSIS — S0990XS Unspecified injury of head, sequela: Secondary | ICD-10-CM | POA: Diagnosis not present

## 2017-02-28 DIAGNOSIS — G44309 Post-traumatic headache, unspecified, not intractable: Secondary | ICD-10-CM | POA: Diagnosis not present

## 2017-02-28 DIAGNOSIS — G894 Chronic pain syndrome: Secondary | ICD-10-CM | POA: Diagnosis not present

## 2017-02-28 DIAGNOSIS — R609 Edema, unspecified: Secondary | ICD-10-CM | POA: Diagnosis not present

## 2017-02-28 DIAGNOSIS — I10 Essential (primary) hypertension: Secondary | ICD-10-CM | POA: Diagnosis not present

## 2017-04-08 DIAGNOSIS — R309 Painful micturition, unspecified: Secondary | ICD-10-CM | POA: Diagnosis not present

## 2017-04-08 DIAGNOSIS — E876 Hypokalemia: Secondary | ICD-10-CM | POA: Diagnosis not present

## 2017-04-08 DIAGNOSIS — Z5181 Encounter for therapeutic drug level monitoring: Secondary | ICD-10-CM | POA: Diagnosis not present

## 2017-04-08 DIAGNOSIS — G44309 Post-traumatic headache, unspecified, not intractable: Secondary | ICD-10-CM | POA: Diagnosis not present

## 2017-04-08 DIAGNOSIS — S0990XS Unspecified injury of head, sequela: Secondary | ICD-10-CM | POA: Diagnosis not present

## 2017-04-08 DIAGNOSIS — G894 Chronic pain syndrome: Secondary | ICD-10-CM | POA: Diagnosis not present

## 2017-04-19 DIAGNOSIS — M7989 Other specified soft tissue disorders: Secondary | ICD-10-CM | POA: Diagnosis not present

## 2017-04-19 DIAGNOSIS — Z79891 Long term (current) use of opiate analgesic: Secondary | ICD-10-CM | POA: Diagnosis not present

## 2017-04-19 DIAGNOSIS — Z885 Allergy status to narcotic agent status: Secondary | ICD-10-CM | POA: Diagnosis not present

## 2017-04-19 DIAGNOSIS — Z79899 Other long term (current) drug therapy: Secondary | ICD-10-CM | POA: Diagnosis not present

## 2017-04-19 DIAGNOSIS — K5090 Crohn's disease, unspecified, without complications: Secondary | ICD-10-CM | POA: Diagnosis not present

## 2017-04-19 DIAGNOSIS — Z886 Allergy status to analgesic agent status: Secondary | ICD-10-CM | POA: Diagnosis not present

## 2017-04-19 DIAGNOSIS — L03114 Cellulitis of left upper limb: Secondary | ICD-10-CM | POA: Diagnosis not present

## 2017-04-19 DIAGNOSIS — R569 Unspecified convulsions: Secondary | ICD-10-CM | POA: Diagnosis not present

## 2017-04-19 DIAGNOSIS — R600 Localized edema: Secondary | ICD-10-CM | POA: Diagnosis not present

## 2017-04-19 DIAGNOSIS — R2243 Localized swelling, mass and lump, lower limb, bilateral: Secondary | ICD-10-CM | POA: Diagnosis not present

## 2017-04-30 DIAGNOSIS — L02414 Cutaneous abscess of left upper limb: Secondary | ICD-10-CM | POA: Diagnosis not present

## 2017-04-30 DIAGNOSIS — Z79899 Other long term (current) drug therapy: Secondary | ICD-10-CM | POA: Diagnosis not present

## 2017-04-30 DIAGNOSIS — Z883 Allergy status to other anti-infective agents status: Secondary | ICD-10-CM | POA: Diagnosis not present

## 2017-04-30 DIAGNOSIS — K5090 Crohn's disease, unspecified, without complications: Secondary | ICD-10-CM | POA: Diagnosis not present

## 2017-04-30 DIAGNOSIS — Z7952 Long term (current) use of systemic steroids: Secondary | ICD-10-CM | POA: Diagnosis not present

## 2017-04-30 DIAGNOSIS — Z8614 Personal history of Methicillin resistant Staphylococcus aureus infection: Secondary | ICD-10-CM | POA: Diagnosis not present

## 2017-04-30 DIAGNOSIS — Z885 Allergy status to narcotic agent status: Secondary | ICD-10-CM | POA: Diagnosis not present

## 2017-04-30 DIAGNOSIS — Z79891 Long term (current) use of opiate analgesic: Secondary | ICD-10-CM | POA: Diagnosis not present

## 2017-04-30 DIAGNOSIS — Z886 Allergy status to analgesic agent status: Secondary | ICD-10-CM | POA: Diagnosis not present

## 2017-04-30 DIAGNOSIS — Z8619 Personal history of other infectious and parasitic diseases: Secondary | ICD-10-CM | POA: Diagnosis not present

## 2017-05-02 DIAGNOSIS — L02414 Cutaneous abscess of left upper limb: Secondary | ICD-10-CM | POA: Diagnosis not present

## 2017-05-02 DIAGNOSIS — Z5189 Encounter for other specified aftercare: Secondary | ICD-10-CM | POA: Diagnosis not present

## 2017-05-02 DIAGNOSIS — Z79899 Other long term (current) drug therapy: Secondary | ICD-10-CM | POA: Diagnosis not present

## 2017-05-02 DIAGNOSIS — Z79891 Long term (current) use of opiate analgesic: Secondary | ICD-10-CM | POA: Diagnosis not present

## 2017-05-02 DIAGNOSIS — Z886 Allergy status to analgesic agent status: Secondary | ICD-10-CM | POA: Diagnosis not present

## 2017-05-02 DIAGNOSIS — Z8619 Personal history of other infectious and parasitic diseases: Secondary | ICD-10-CM | POA: Diagnosis not present

## 2017-05-02 DIAGNOSIS — R569 Unspecified convulsions: Secondary | ICD-10-CM | POA: Diagnosis not present

## 2017-05-02 DIAGNOSIS — K5090 Crohn's disease, unspecified, without complications: Secondary | ICD-10-CM | POA: Diagnosis not present

## 2017-05-02 DIAGNOSIS — Z888 Allergy status to other drugs, medicaments and biological substances status: Secondary | ICD-10-CM | POA: Diagnosis not present

## 2017-05-05 DIAGNOSIS — Z885 Allergy status to narcotic agent status: Secondary | ICD-10-CM | POA: Diagnosis not present

## 2017-05-05 DIAGNOSIS — L0390 Cellulitis, unspecified: Secondary | ICD-10-CM | POA: Diagnosis not present

## 2017-05-05 DIAGNOSIS — Z881 Allergy status to other antibiotic agents status: Secondary | ICD-10-CM | POA: Diagnosis not present

## 2017-05-05 DIAGNOSIS — Z79899 Other long term (current) drug therapy: Secondary | ICD-10-CM | POA: Diagnosis not present

## 2017-05-05 DIAGNOSIS — Z79891 Long term (current) use of opiate analgesic: Secondary | ICD-10-CM | POA: Diagnosis not present

## 2017-05-05 DIAGNOSIS — L03114 Cellulitis of left upper limb: Secondary | ICD-10-CM | POA: Diagnosis not present

## 2017-05-05 DIAGNOSIS — K5090 Crohn's disease, unspecified, without complications: Secondary | ICD-10-CM | POA: Diagnosis not present

## 2017-05-05 DIAGNOSIS — Z886 Allergy status to analgesic agent status: Secondary | ICD-10-CM | POA: Diagnosis not present

## 2017-05-07 DIAGNOSIS — Z5181 Encounter for therapeutic drug level monitoring: Secondary | ICD-10-CM | POA: Diagnosis not present

## 2017-05-07 DIAGNOSIS — Z79899 Other long term (current) drug therapy: Secondary | ICD-10-CM | POA: Diagnosis not present

## 2017-05-07 DIAGNOSIS — G894 Chronic pain syndrome: Secondary | ICD-10-CM | POA: Diagnosis not present

## 2017-05-09 DIAGNOSIS — G894 Chronic pain syndrome: Secondary | ICD-10-CM | POA: Diagnosis not present

## 2017-05-09 DIAGNOSIS — R825 Elevated urine levels of drugs, medicaments and biological substances: Secondary | ICD-10-CM | POA: Diagnosis not present

## 2017-05-14 DIAGNOSIS — Z1231 Encounter for screening mammogram for malignant neoplasm of breast: Secondary | ICD-10-CM | POA: Diagnosis not present

## 2017-06-07 DIAGNOSIS — R51 Headache: Secondary | ICD-10-CM | POA: Diagnosis not present

## 2017-06-07 DIAGNOSIS — G629 Polyneuropathy, unspecified: Secondary | ICD-10-CM | POA: Diagnosis not present

## 2017-06-07 DIAGNOSIS — Z5181 Encounter for therapeutic drug level monitoring: Secondary | ICD-10-CM | POA: Diagnosis not present

## 2017-06-07 DIAGNOSIS — E538 Deficiency of other specified B group vitamins: Secondary | ICD-10-CM | POA: Diagnosis not present

## 2017-06-07 DIAGNOSIS — I10 Essential (primary) hypertension: Secondary | ICD-10-CM | POA: Diagnosis not present

## 2017-06-07 DIAGNOSIS — M542 Cervicalgia: Secondary | ICD-10-CM | POA: Diagnosis not present

## 2017-06-07 DIAGNOSIS — G894 Chronic pain syndrome: Secondary | ICD-10-CM | POA: Diagnosis not present

## 2017-07-01 DIAGNOSIS — M542 Cervicalgia: Secondary | ICD-10-CM | POA: Diagnosis not present

## 2017-07-01 DIAGNOSIS — E538 Deficiency of other specified B group vitamins: Secondary | ICD-10-CM | POA: Diagnosis not present

## 2017-07-01 DIAGNOSIS — M9971 Connective tissue and disc stenosis of intervertebral foramina of cervical region: Secondary | ICD-10-CM | POA: Diagnosis not present

## 2017-07-01 DIAGNOSIS — R05 Cough: Secondary | ICD-10-CM | POA: Diagnosis not present

## 2017-07-01 DIAGNOSIS — M47892 Other spondylosis, cervical region: Secondary | ICD-10-CM | POA: Diagnosis not present

## 2017-07-01 DIAGNOSIS — H9209 Otalgia, unspecified ear: Secondary | ICD-10-CM | POA: Diagnosis not present

## 2017-07-01 DIAGNOSIS — Z981 Arthrodesis status: Secondary | ICD-10-CM | POA: Diagnosis not present

## 2017-08-02 DIAGNOSIS — R1084 Generalized abdominal pain: Secondary | ICD-10-CM | POA: Diagnosis not present

## 2017-08-02 DIAGNOSIS — Z5181 Encounter for therapeutic drug level monitoring: Secondary | ICD-10-CM | POA: Diagnosis not present

## 2017-08-02 DIAGNOSIS — N390 Urinary tract infection, site not specified: Secondary | ICD-10-CM | POA: Diagnosis not present

## 2017-08-02 DIAGNOSIS — G894 Chronic pain syndrome: Secondary | ICD-10-CM | POA: Diagnosis not present

## 2017-08-13 DIAGNOSIS — N281 Cyst of kidney, acquired: Secondary | ICD-10-CM | POA: Diagnosis not present

## 2017-08-13 DIAGNOSIS — Z9049 Acquired absence of other specified parts of digestive tract: Secondary | ICD-10-CM | POA: Diagnosis not present

## 2017-08-13 DIAGNOSIS — R1084 Generalized abdominal pain: Secondary | ICD-10-CM | POA: Diagnosis not present

## 2017-08-13 DIAGNOSIS — R109 Unspecified abdominal pain: Secondary | ICD-10-CM | POA: Diagnosis not present

## 2017-08-13 DIAGNOSIS — K760 Fatty (change of) liver, not elsewhere classified: Secondary | ICD-10-CM | POA: Diagnosis not present

## 2017-08-22 DIAGNOSIS — Z9089 Acquired absence of other organs: Secondary | ICD-10-CM | POA: Diagnosis not present

## 2017-08-22 DIAGNOSIS — R935 Abnormal findings on diagnostic imaging of other abdominal regions, including retroperitoneum: Secondary | ICD-10-CM | POA: Diagnosis not present

## 2017-08-22 DIAGNOSIS — N281 Cyst of kidney, acquired: Secondary | ICD-10-CM | POA: Diagnosis not present

## 2017-08-22 DIAGNOSIS — J9811 Atelectasis: Secondary | ICD-10-CM | POA: Diagnosis not present

## 2017-08-22 DIAGNOSIS — R918 Other nonspecific abnormal finding of lung field: Secondary | ICD-10-CM | POA: Diagnosis not present

## 2017-08-22 DIAGNOSIS — Z9049 Acquired absence of other specified parts of digestive tract: Secondary | ICD-10-CM | POA: Diagnosis not present

## 2017-08-26 DIAGNOSIS — Z888 Allergy status to other drugs, medicaments and biological substances status: Secondary | ICD-10-CM | POA: Diagnosis not present

## 2017-08-26 DIAGNOSIS — Z886 Allergy status to analgesic agent status: Secondary | ICD-10-CM | POA: Diagnosis not present

## 2017-08-26 DIAGNOSIS — B349 Viral infection, unspecified: Secondary | ICD-10-CM | POA: Diagnosis not present

## 2017-08-26 DIAGNOSIS — Z885 Allergy status to narcotic agent status: Secondary | ICD-10-CM | POA: Diagnosis not present

## 2017-08-26 DIAGNOSIS — Z881 Allergy status to other antibiotic agents status: Secondary | ICD-10-CM | POA: Diagnosis not present

## 2017-08-26 DIAGNOSIS — Z79899 Other long term (current) drug therapy: Secondary | ICD-10-CM | POA: Diagnosis not present

## 2017-08-26 DIAGNOSIS — Z79891 Long term (current) use of opiate analgesic: Secondary | ICD-10-CM | POA: Diagnosis not present

## 2017-08-30 DIAGNOSIS — R296 Repeated falls: Secondary | ICD-10-CM | POA: Diagnosis not present

## 2017-08-30 DIAGNOSIS — I10 Essential (primary) hypertension: Secondary | ICD-10-CM | POA: Diagnosis not present

## 2017-08-30 DIAGNOSIS — N390 Urinary tract infection, site not specified: Secondary | ICD-10-CM | POA: Diagnosis not present

## 2017-08-30 DIAGNOSIS — G894 Chronic pain syndrome: Secondary | ICD-10-CM | POA: Diagnosis not present

## 2017-08-30 DIAGNOSIS — Z5181 Encounter for therapeutic drug level monitoring: Secondary | ICD-10-CM | POA: Diagnosis not present

## 2017-08-30 DIAGNOSIS — R05 Cough: Secondary | ICD-10-CM | POA: Diagnosis not present

## 2017-09-25 DIAGNOSIS — I10 Essential (primary) hypertension: Secondary | ICD-10-CM | POA: Diagnosis not present

## 2017-09-26 DIAGNOSIS — K219 Gastro-esophageal reflux disease without esophagitis: Secondary | ICD-10-CM | POA: Diagnosis not present

## 2017-09-26 DIAGNOSIS — R1013 Epigastric pain: Secondary | ICD-10-CM | POA: Diagnosis not present

## 2017-09-26 DIAGNOSIS — Z8719 Personal history of other diseases of the digestive system: Secondary | ICD-10-CM | POA: Diagnosis not present

## 2017-09-26 DIAGNOSIS — R935 Abnormal findings on diagnostic imaging of other abdominal regions, including retroperitoneum: Secondary | ICD-10-CM | POA: Diagnosis not present

## 2017-09-26 DIAGNOSIS — R1310 Dysphagia, unspecified: Secondary | ICD-10-CM | POA: Diagnosis not present

## 2017-09-27 DIAGNOSIS — Z5181 Encounter for therapeutic drug level monitoring: Secondary | ICD-10-CM | POA: Diagnosis not present

## 2017-09-27 DIAGNOSIS — I10 Essential (primary) hypertension: Secondary | ICD-10-CM | POA: Diagnosis not present

## 2017-09-27 DIAGNOSIS — Z79899 Other long term (current) drug therapy: Secondary | ICD-10-CM | POA: Diagnosis not present

## 2017-09-27 DIAGNOSIS — R05 Cough: Secondary | ICD-10-CM | POA: Diagnosis not present

## 2017-09-27 DIAGNOSIS — G894 Chronic pain syndrome: Secondary | ICD-10-CM | POA: Diagnosis not present

## 2017-10-10 DIAGNOSIS — G629 Polyneuropathy, unspecified: Secondary | ICD-10-CM | POA: Diagnosis not present

## 2017-10-10 DIAGNOSIS — Z23 Encounter for immunization: Secondary | ICD-10-CM | POA: Diagnosis not present

## 2017-11-09 DIAGNOSIS — R600 Localized edema: Secondary | ICD-10-CM | POA: Diagnosis not present

## 2017-11-09 DIAGNOSIS — L03113 Cellulitis of right upper limb: Secondary | ICD-10-CM | POA: Diagnosis not present

## 2017-11-09 DIAGNOSIS — Z886 Allergy status to analgesic agent status: Secondary | ICD-10-CM | POA: Diagnosis not present

## 2017-11-09 DIAGNOSIS — L0390 Cellulitis, unspecified: Secondary | ICD-10-CM | POA: Diagnosis not present

## 2017-11-09 DIAGNOSIS — K5090 Crohn's disease, unspecified, without complications: Secondary | ICD-10-CM | POA: Diagnosis not present

## 2017-11-09 DIAGNOSIS — M25521 Pain in right elbow: Secondary | ICD-10-CM | POA: Diagnosis not present

## 2017-11-09 DIAGNOSIS — Z881 Allergy status to other antibiotic agents status: Secondary | ICD-10-CM | POA: Diagnosis not present

## 2017-11-09 DIAGNOSIS — M7989 Other specified soft tissue disorders: Secondary | ICD-10-CM | POA: Diagnosis not present

## 2017-11-09 DIAGNOSIS — Z79899 Other long term (current) drug therapy: Secondary | ICD-10-CM | POA: Diagnosis not present

## 2017-11-13 DIAGNOSIS — K5090 Crohn's disease, unspecified, without complications: Secondary | ICD-10-CM | POA: Diagnosis not present

## 2017-11-13 DIAGNOSIS — M25521 Pain in right elbow: Secondary | ICD-10-CM | POA: Diagnosis not present

## 2017-11-13 DIAGNOSIS — Z79899 Other long term (current) drug therapy: Secondary | ICD-10-CM | POA: Diagnosis not present

## 2017-11-13 DIAGNOSIS — M7031 Other bursitis of elbow, right elbow: Secondary | ICD-10-CM | POA: Diagnosis not present

## 2017-11-13 DIAGNOSIS — Z886 Allergy status to analgesic agent status: Secondary | ICD-10-CM | POA: Diagnosis not present

## 2017-11-13 DIAGNOSIS — L03113 Cellulitis of right upper limb: Secondary | ICD-10-CM | POA: Diagnosis not present

## 2017-11-13 DIAGNOSIS — Z881 Allergy status to other antibiotic agents status: Secondary | ICD-10-CM | POA: Diagnosis not present

## 2017-11-13 DIAGNOSIS — M7989 Other specified soft tissue disorders: Secondary | ICD-10-CM | POA: Diagnosis not present

## 2017-11-28 DIAGNOSIS — G894 Chronic pain syndrome: Secondary | ICD-10-CM | POA: Diagnosis not present

## 2017-11-28 DIAGNOSIS — Z5181 Encounter for therapeutic drug level monitoring: Secondary | ICD-10-CM | POA: Diagnosis not present

## 2017-11-28 DIAGNOSIS — N390 Urinary tract infection, site not specified: Secondary | ICD-10-CM | POA: Diagnosis not present

## 2017-11-28 DIAGNOSIS — G629 Polyneuropathy, unspecified: Secondary | ICD-10-CM | POA: Diagnosis not present

## 2017-11-28 DIAGNOSIS — M719 Bursopathy, unspecified: Secondary | ICD-10-CM | POA: Diagnosis not present

## 2017-12-17 DIAGNOSIS — K5090 Crohn's disease, unspecified, without complications: Secondary | ICD-10-CM | POA: Diagnosis not present

## 2017-12-17 DIAGNOSIS — E559 Vitamin D deficiency, unspecified: Secondary | ICD-10-CM | POA: Diagnosis not present

## 2017-12-17 DIAGNOSIS — R21 Rash and other nonspecific skin eruption: Secondary | ICD-10-CM | POA: Diagnosis not present

## 2017-12-17 DIAGNOSIS — K219 Gastro-esophageal reflux disease without esophagitis: Secondary | ICD-10-CM | POA: Diagnosis not present

## 2017-12-17 DIAGNOSIS — I10 Essential (primary) hypertension: Secondary | ICD-10-CM | POA: Diagnosis not present

## 2017-12-18 DIAGNOSIS — M79672 Pain in left foot: Secondary | ICD-10-CM | POA: Diagnosis not present

## 2017-12-18 DIAGNOSIS — R2241 Localized swelling, mass and lump, right lower limb: Secondary | ICD-10-CM | POA: Diagnosis not present

## 2017-12-18 DIAGNOSIS — M5136 Other intervertebral disc degeneration, lumbar region: Secondary | ICD-10-CM | POA: Diagnosis not present

## 2017-12-18 DIAGNOSIS — M792 Neuralgia and neuritis, unspecified: Secondary | ICD-10-CM | POA: Diagnosis not present

## 2017-12-18 DIAGNOSIS — M7989 Other specified soft tissue disorders: Secondary | ICD-10-CM | POA: Diagnosis not present

## 2017-12-18 DIAGNOSIS — M79671 Pain in right foot: Secondary | ICD-10-CM | POA: Diagnosis not present

## 2017-12-18 DIAGNOSIS — M25571 Pain in right ankle and joints of right foot: Secondary | ICD-10-CM | POA: Diagnosis not present

## 2017-12-25 DIAGNOSIS — I10 Essential (primary) hypertension: Secondary | ICD-10-CM | POA: Diagnosis not present

## 2017-12-25 DIAGNOSIS — R7303 Prediabetes: Secondary | ICD-10-CM | POA: Diagnosis not present

## 2017-12-25 DIAGNOSIS — E559 Vitamin D deficiency, unspecified: Secondary | ICD-10-CM | POA: Diagnosis not present

## 2017-12-26 DIAGNOSIS — Z79899 Other long term (current) drug therapy: Secondary | ICD-10-CM | POA: Diagnosis not present

## 2017-12-26 DIAGNOSIS — Z5181 Encounter for therapeutic drug level monitoring: Secondary | ICD-10-CM | POA: Diagnosis not present

## 2017-12-26 DIAGNOSIS — G894 Chronic pain syndrome: Secondary | ICD-10-CM | POA: Diagnosis not present

## 2017-12-27 DIAGNOSIS — G6289 Other specified polyneuropathies: Secondary | ICD-10-CM | POA: Diagnosis not present

## 2017-12-27 DIAGNOSIS — Z9181 History of falling: Secondary | ICD-10-CM | POA: Diagnosis not present

## 2018-01-07 DIAGNOSIS — Z79899 Other long term (current) drug therapy: Secondary | ICD-10-CM | POA: Diagnosis not present

## 2018-01-07 DIAGNOSIS — J9811 Atelectasis: Secondary | ICD-10-CM | POA: Diagnosis not present

## 2018-01-07 DIAGNOSIS — L02415 Cutaneous abscess of right lower limb: Secondary | ICD-10-CM | POA: Diagnosis not present

## 2018-01-07 DIAGNOSIS — L03116 Cellulitis of left lower limb: Secondary | ICD-10-CM | POA: Diagnosis not present

## 2018-01-07 DIAGNOSIS — R05 Cough: Secondary | ICD-10-CM | POA: Diagnosis not present

## 2018-01-07 DIAGNOSIS — L03115 Cellulitis of right lower limb: Secondary | ICD-10-CM | POA: Diagnosis not present

## 2018-01-07 DIAGNOSIS — J069 Acute upper respiratory infection, unspecified: Secondary | ICD-10-CM | POA: Diagnosis not present

## 2018-01-07 DIAGNOSIS — Z886 Allergy status to analgesic agent status: Secondary | ICD-10-CM | POA: Diagnosis not present

## 2018-01-07 DIAGNOSIS — L02416 Cutaneous abscess of left lower limb: Secondary | ICD-10-CM | POA: Diagnosis not present

## 2018-01-07 DIAGNOSIS — J984 Other disorders of lung: Secondary | ICD-10-CM | POA: Diagnosis not present

## 2018-01-07 DIAGNOSIS — Z8619 Personal history of other infectious and parasitic diseases: Secondary | ICD-10-CM | POA: Diagnosis not present

## 2018-01-07 DIAGNOSIS — L03119 Cellulitis of unspecified part of limb: Secondary | ICD-10-CM | POA: Diagnosis not present

## 2018-01-07 DIAGNOSIS — L02419 Cutaneous abscess of limb, unspecified: Secondary | ICD-10-CM | POA: Diagnosis not present

## 2018-01-07 DIAGNOSIS — Z888 Allergy status to other drugs, medicaments and biological substances status: Secondary | ICD-10-CM | POA: Diagnosis not present

## 2018-01-17 DIAGNOSIS — M7989 Other specified soft tissue disorders: Secondary | ICD-10-CM | POA: Diagnosis not present

## 2018-01-17 DIAGNOSIS — M5136 Other intervertebral disc degeneration, lumbar region: Secondary | ICD-10-CM | POA: Diagnosis not present

## 2018-01-17 DIAGNOSIS — M792 Neuralgia and neuritis, unspecified: Secondary | ICD-10-CM | POA: Diagnosis not present

## 2018-01-23 DIAGNOSIS — M549 Dorsalgia, unspecified: Secondary | ICD-10-CM | POA: Diagnosis not present

## 2018-01-23 DIAGNOSIS — Z79899 Other long term (current) drug therapy: Secondary | ICD-10-CM | POA: Diagnosis not present

## 2018-01-23 DIAGNOSIS — L03116 Cellulitis of left lower limb: Secondary | ICD-10-CM | POA: Diagnosis not present

## 2018-01-23 DIAGNOSIS — Z5181 Encounter for therapeutic drug level monitoring: Secondary | ICD-10-CM | POA: Diagnosis not present

## 2018-01-23 DIAGNOSIS — G894 Chronic pain syndrome: Secondary | ICD-10-CM | POA: Diagnosis not present

## 2018-01-29 DIAGNOSIS — I739 Peripheral vascular disease, unspecified: Secondary | ICD-10-CM | POA: Diagnosis not present

## 2018-01-29 DIAGNOSIS — M79673 Pain in unspecified foot: Secondary | ICD-10-CM | POA: Diagnosis not present

## 2018-01-29 DIAGNOSIS — M5416 Radiculopathy, lumbar region: Secondary | ICD-10-CM | POA: Diagnosis not present

## 2018-01-29 DIAGNOSIS — G629 Polyneuropathy, unspecified: Secondary | ICD-10-CM | POA: Diagnosis not present

## 2018-02-05 DIAGNOSIS — M79673 Pain in unspecified foot: Secondary | ICD-10-CM | POA: Diagnosis not present

## 2018-02-05 DIAGNOSIS — M5416 Radiculopathy, lumbar region: Secondary | ICD-10-CM | POA: Diagnosis not present

## 2018-02-05 DIAGNOSIS — I739 Peripheral vascular disease, unspecified: Secondary | ICD-10-CM | POA: Diagnosis not present

## 2018-02-05 DIAGNOSIS — G629 Polyneuropathy, unspecified: Secondary | ICD-10-CM | POA: Diagnosis not present

## 2018-02-06 DIAGNOSIS — I739 Peripheral vascular disease, unspecified: Secondary | ICD-10-CM | POA: Diagnosis not present

## 2018-02-06 DIAGNOSIS — M5416 Radiculopathy, lumbar region: Secondary | ICD-10-CM | POA: Diagnosis not present

## 2018-02-06 DIAGNOSIS — M47897 Other spondylosis, lumbosacral region: Secondary | ICD-10-CM | POA: Diagnosis not present

## 2018-02-06 DIAGNOSIS — M47896 Other spondylosis, lumbar region: Secondary | ICD-10-CM | POA: Diagnosis not present

## 2018-02-06 DIAGNOSIS — M79673 Pain in unspecified foot: Secondary | ICD-10-CM | POA: Diagnosis not present

## 2018-02-06 DIAGNOSIS — M47895 Other spondylosis, thoracolumbar region: Secondary | ICD-10-CM | POA: Diagnosis not present

## 2018-02-06 DIAGNOSIS — M5126 Other intervertebral disc displacement, lumbar region: Secondary | ICD-10-CM | POA: Diagnosis not present

## 2018-02-10 DIAGNOSIS — L03116 Cellulitis of left lower limb: Secondary | ICD-10-CM | POA: Diagnosis not present

## 2018-02-10 DIAGNOSIS — H9209 Otalgia, unspecified ear: Secondary | ICD-10-CM | POA: Diagnosis not present

## 2018-02-24 DIAGNOSIS — R531 Weakness: Secondary | ICD-10-CM | POA: Diagnosis not present

## 2018-02-24 DIAGNOSIS — L03116 Cellulitis of left lower limb: Secondary | ICD-10-CM | POA: Diagnosis not present

## 2018-02-24 DIAGNOSIS — Z5181 Encounter for therapeutic drug level monitoring: Secondary | ICD-10-CM | POA: Diagnosis not present

## 2018-02-24 DIAGNOSIS — M549 Dorsalgia, unspecified: Secondary | ICD-10-CM | POA: Diagnosis not present

## 2018-02-24 DIAGNOSIS — G894 Chronic pain syndrome: Secondary | ICD-10-CM | POA: Diagnosis not present

## 2018-02-25 DIAGNOSIS — I872 Venous insufficiency (chronic) (peripheral): Secondary | ICD-10-CM | POA: Diagnosis not present

## 2018-02-25 DIAGNOSIS — L97828 Non-pressure chronic ulcer of other part of left lower leg with other specified severity: Secondary | ICD-10-CM | POA: Diagnosis not present

## 2018-03-04 DIAGNOSIS — M5416 Radiculopathy, lumbar region: Secondary | ICD-10-CM | POA: Diagnosis not present

## 2018-03-04 DIAGNOSIS — M5136 Other intervertebral disc degeneration, lumbar region: Secondary | ICD-10-CM | POA: Diagnosis not present

## 2018-03-18 DIAGNOSIS — H2513 Age-related nuclear cataract, bilateral: Secondary | ICD-10-CM | POA: Diagnosis not present

## 2018-03-19 DIAGNOSIS — M5416 Radiculopathy, lumbar region: Secondary | ICD-10-CM | POA: Diagnosis not present

## 2018-03-19 DIAGNOSIS — M5136 Other intervertebral disc degeneration, lumbar region: Secondary | ICD-10-CM | POA: Diagnosis not present

## 2018-03-24 DIAGNOSIS — Z5181 Encounter for therapeutic drug level monitoring: Secondary | ICD-10-CM | POA: Diagnosis not present

## 2018-03-24 DIAGNOSIS — R531 Weakness: Secondary | ICD-10-CM | POA: Diagnosis not present

## 2018-03-24 DIAGNOSIS — G894 Chronic pain syndrome: Secondary | ICD-10-CM | POA: Diagnosis not present

## 2018-03-24 DIAGNOSIS — M549 Dorsalgia, unspecified: Secondary | ICD-10-CM | POA: Diagnosis not present

## 2018-03-24 DIAGNOSIS — Z79899 Other long term (current) drug therapy: Secondary | ICD-10-CM | POA: Diagnosis not present

## 2018-03-26 DIAGNOSIS — H2513 Age-related nuclear cataract, bilateral: Secondary | ICD-10-CM | POA: Diagnosis not present

## 2018-03-26 DIAGNOSIS — H2511 Age-related nuclear cataract, right eye: Secondary | ICD-10-CM | POA: Diagnosis not present

## 2018-04-04 DIAGNOSIS — S81812A Laceration without foreign body, left lower leg, initial encounter: Secondary | ICD-10-CM | POA: Diagnosis not present

## 2018-04-04 DIAGNOSIS — R600 Localized edema: Secondary | ICD-10-CM | POA: Diagnosis not present

## 2018-04-15 DIAGNOSIS — R600 Localized edema: Secondary | ICD-10-CM | POA: Diagnosis not present

## 2018-04-15 DIAGNOSIS — S81812A Laceration without foreign body, left lower leg, initial encounter: Secondary | ICD-10-CM | POA: Diagnosis not present

## 2018-04-21 DIAGNOSIS — Z5181 Encounter for therapeutic drug level monitoring: Secondary | ICD-10-CM | POA: Diagnosis not present

## 2018-04-21 DIAGNOSIS — M549 Dorsalgia, unspecified: Secondary | ICD-10-CM | POA: Diagnosis not present

## 2018-04-21 DIAGNOSIS — R51 Headache: Secondary | ICD-10-CM | POA: Diagnosis not present

## 2018-04-21 DIAGNOSIS — G894 Chronic pain syndrome: Secondary | ICD-10-CM | POA: Diagnosis not present

## 2018-04-21 DIAGNOSIS — H269 Unspecified cataract: Secondary | ICD-10-CM | POA: Diagnosis not present

## 2018-04-22 DIAGNOSIS — S81812A Laceration without foreign body, left lower leg, initial encounter: Secondary | ICD-10-CM | POA: Diagnosis not present

## 2018-04-22 DIAGNOSIS — R600 Localized edema: Secondary | ICD-10-CM | POA: Diagnosis not present

## 2018-04-28 DIAGNOSIS — S81812A Laceration without foreign body, left lower leg, initial encounter: Secondary | ICD-10-CM | POA: Diagnosis not present

## 2018-04-28 DIAGNOSIS — R600 Localized edema: Secondary | ICD-10-CM | POA: Diagnosis not present

## 2018-05-06 DIAGNOSIS — L97819 Non-pressure chronic ulcer of other part of right lower leg with unspecified severity: Secondary | ICD-10-CM | POA: Diagnosis not present

## 2018-05-16 DIAGNOSIS — S8011XD Contusion of right lower leg, subsequent encounter: Secondary | ICD-10-CM | POA: Diagnosis not present

## 2018-05-16 DIAGNOSIS — S81802D Unspecified open wound, left lower leg, subsequent encounter: Secondary | ICD-10-CM | POA: Diagnosis not present

## 2018-05-16 DIAGNOSIS — L97819 Non-pressure chronic ulcer of other part of right lower leg with unspecified severity: Secondary | ICD-10-CM | POA: Diagnosis not present

## 2018-05-20 DIAGNOSIS — Z881 Allergy status to other antibiotic agents status: Secondary | ICD-10-CM | POA: Diagnosis not present

## 2018-05-20 DIAGNOSIS — J45909 Unspecified asthma, uncomplicated: Secondary | ICD-10-CM | POA: Diagnosis not present

## 2018-05-20 DIAGNOSIS — Z8619 Personal history of other infectious and parasitic diseases: Secondary | ICD-10-CM | POA: Diagnosis not present

## 2018-05-20 DIAGNOSIS — Z8614 Personal history of Methicillin resistant Staphylococcus aureus infection: Secondary | ICD-10-CM | POA: Diagnosis not present

## 2018-05-20 DIAGNOSIS — K5090 Crohn's disease, unspecified, without complications: Secondary | ICD-10-CM | POA: Diagnosis not present

## 2018-05-20 DIAGNOSIS — H2511 Age-related nuclear cataract, right eye: Secondary | ICD-10-CM | POA: Diagnosis not present

## 2018-05-20 DIAGNOSIS — Z7951 Long term (current) use of inhaled steroids: Secondary | ICD-10-CM | POA: Diagnosis not present

## 2018-05-20 DIAGNOSIS — Z79899 Other long term (current) drug therapy: Secondary | ICD-10-CM | POA: Diagnosis not present

## 2018-05-20 DIAGNOSIS — G8929 Other chronic pain: Secondary | ICD-10-CM | POA: Diagnosis not present

## 2018-05-20 DIAGNOSIS — M1990 Unspecified osteoarthritis, unspecified site: Secondary | ICD-10-CM | POA: Diagnosis not present

## 2018-05-20 DIAGNOSIS — Z79891 Long term (current) use of opiate analgesic: Secondary | ICD-10-CM | POA: Diagnosis not present

## 2018-05-20 DIAGNOSIS — Z886 Allergy status to analgesic agent status: Secondary | ICD-10-CM | POA: Diagnosis not present

## 2018-05-20 DIAGNOSIS — I10 Essential (primary) hypertension: Secondary | ICD-10-CM | POA: Diagnosis not present

## 2018-05-20 DIAGNOSIS — Z888 Allergy status to other drugs, medicaments and biological substances status: Secondary | ICD-10-CM | POA: Diagnosis not present

## 2018-05-21 DIAGNOSIS — H2512 Age-related nuclear cataract, left eye: Secondary | ICD-10-CM | POA: Diagnosis not present

## 2018-05-21 DIAGNOSIS — Z961 Presence of intraocular lens: Secondary | ICD-10-CM | POA: Diagnosis not present

## 2018-05-23 DIAGNOSIS — L97819 Non-pressure chronic ulcer of other part of right lower leg with unspecified severity: Secondary | ICD-10-CM | POA: Diagnosis not present

## 2018-05-30 DIAGNOSIS — L97819 Non-pressure chronic ulcer of other part of right lower leg with unspecified severity: Secondary | ICD-10-CM | POA: Diagnosis not present

## 2018-06-06 DIAGNOSIS — S81812A Laceration without foreign body, left lower leg, initial encounter: Secondary | ICD-10-CM | POA: Diagnosis not present

## 2018-06-10 DIAGNOSIS — R569 Unspecified convulsions: Secondary | ICD-10-CM | POA: Diagnosis not present

## 2018-06-10 DIAGNOSIS — I10 Essential (primary) hypertension: Secondary | ICD-10-CM | POA: Diagnosis not present

## 2018-06-10 DIAGNOSIS — M1990 Unspecified osteoarthritis, unspecified site: Secondary | ICD-10-CM | POA: Diagnosis not present

## 2018-06-10 DIAGNOSIS — H2512 Age-related nuclear cataract, left eye: Secondary | ICD-10-CM | POA: Diagnosis not present

## 2018-06-10 DIAGNOSIS — K5090 Crohn's disease, unspecified, without complications: Secondary | ICD-10-CM | POA: Diagnosis not present

## 2018-06-10 DIAGNOSIS — Z79891 Long term (current) use of opiate analgesic: Secondary | ICD-10-CM | POA: Diagnosis not present

## 2018-06-10 DIAGNOSIS — Z888 Allergy status to other drugs, medicaments and biological substances status: Secondary | ICD-10-CM | POA: Diagnosis not present

## 2018-06-10 DIAGNOSIS — Z79899 Other long term (current) drug therapy: Secondary | ICD-10-CM | POA: Diagnosis not present

## 2018-06-13 DIAGNOSIS — S81812A Laceration without foreign body, left lower leg, initial encounter: Secondary | ICD-10-CM | POA: Diagnosis not present

## 2018-06-20 DIAGNOSIS — M542 Cervicalgia: Secondary | ICD-10-CM | POA: Diagnosis not present

## 2018-06-20 DIAGNOSIS — G894 Chronic pain syndrome: Secondary | ICD-10-CM | POA: Diagnosis not present

## 2018-06-20 DIAGNOSIS — M549 Dorsalgia, unspecified: Secondary | ICD-10-CM | POA: Diagnosis not present

## 2018-06-20 DIAGNOSIS — K5090 Crohn's disease, unspecified, without complications: Secondary | ICD-10-CM | POA: Diagnosis not present

## 2018-06-20 DIAGNOSIS — Z5181 Encounter for therapeutic drug level monitoring: Secondary | ICD-10-CM | POA: Diagnosis not present

## 2018-06-21 DIAGNOSIS — R079 Chest pain, unspecified: Secondary | ICD-10-CM | POA: Diagnosis not present

## 2018-06-23 DIAGNOSIS — M6281 Muscle weakness (generalized): Secondary | ICD-10-CM | POA: Diagnosis not present

## 2018-06-23 DIAGNOSIS — R6520 Severe sepsis without septic shock: Secondary | ICD-10-CM | POA: Diagnosis not present

## 2018-06-23 DIAGNOSIS — J9601 Acute respiratory failure with hypoxia: Secondary | ICD-10-CM | POA: Diagnosis not present

## 2018-06-23 DIAGNOSIS — Z881 Allergy status to other antibiotic agents status: Secondary | ICD-10-CM | POA: Diagnosis not present

## 2018-06-23 DIAGNOSIS — I7090 Unspecified atherosclerosis: Secondary | ICD-10-CM | POA: Diagnosis not present

## 2018-06-23 DIAGNOSIS — Z9119 Patient's noncompliance with other medical treatment and regimen: Secondary | ICD-10-CM | POA: Diagnosis not present

## 2018-06-23 DIAGNOSIS — R599 Enlarged lymph nodes, unspecified: Secondary | ICD-10-CM | POA: Diagnosis not present

## 2018-06-23 DIAGNOSIS — J90 Pleural effusion, not elsewhere classified: Secondary | ICD-10-CM | POA: Diagnosis not present

## 2018-06-23 DIAGNOSIS — A419 Sepsis, unspecified organism: Secondary | ICD-10-CM | POA: Diagnosis not present

## 2018-06-23 DIAGNOSIS — J189 Pneumonia, unspecified organism: Secondary | ICD-10-CM | POA: Diagnosis not present

## 2018-06-23 DIAGNOSIS — J181 Lobar pneumonia, unspecified organism: Secondary | ICD-10-CM | POA: Diagnosis not present

## 2018-06-23 DIAGNOSIS — T402X5A Adverse effect of other opioids, initial encounter: Secondary | ICD-10-CM | POA: Diagnosis not present

## 2018-06-23 DIAGNOSIS — R1032 Left lower quadrant pain: Secondary | ICD-10-CM | POA: Diagnosis not present

## 2018-06-23 DIAGNOSIS — R197 Diarrhea, unspecified: Secondary | ICD-10-CM | POA: Diagnosis not present

## 2018-06-23 DIAGNOSIS — J440 Chronic obstructive pulmonary disease with acute lower respiratory infection: Secondary | ICD-10-CM | POA: Diagnosis not present

## 2018-06-23 DIAGNOSIS — T17918A Gastric contents in respiratory tract, part unspecified causing other injury, initial encounter: Secondary | ICD-10-CM | POA: Diagnosis not present

## 2018-06-23 DIAGNOSIS — A4102 Sepsis due to Methicillin resistant Staphylococcus aureus: Secondary | ICD-10-CM | POA: Diagnosis not present

## 2018-06-23 DIAGNOSIS — R109 Unspecified abdominal pain: Secondary | ICD-10-CM | POA: Diagnosis not present

## 2018-06-23 DIAGNOSIS — T424X5A Adverse effect of benzodiazepines, initial encounter: Secondary | ICD-10-CM | POA: Diagnosis not present

## 2018-06-23 DIAGNOSIS — E860 Dehydration: Secondary | ICD-10-CM | POA: Diagnosis not present

## 2018-06-23 DIAGNOSIS — J449 Chronic obstructive pulmonary disease, unspecified: Secondary | ICD-10-CM | POA: Diagnosis not present

## 2018-06-23 DIAGNOSIS — R112 Nausea with vomiting, unspecified: Secondary | ICD-10-CM | POA: Diagnosis not present

## 2018-06-23 DIAGNOSIS — R609 Edema, unspecified: Secondary | ICD-10-CM | POA: Diagnosis not present

## 2018-06-23 DIAGNOSIS — M1990 Unspecified osteoarthritis, unspecified site: Secondary | ICD-10-CM | POA: Diagnosis not present

## 2018-06-23 DIAGNOSIS — Z452 Encounter for adjustment and management of vascular access device: Secondary | ICD-10-CM | POA: Diagnosis not present

## 2018-06-23 DIAGNOSIS — J441 Chronic obstructive pulmonary disease with (acute) exacerbation: Secondary | ICD-10-CM | POA: Diagnosis not present

## 2018-06-23 DIAGNOSIS — E861 Hypovolemia: Secondary | ICD-10-CM | POA: Diagnosis not present

## 2018-06-23 DIAGNOSIS — I10 Essential (primary) hypertension: Secondary | ICD-10-CM | POA: Diagnosis not present

## 2018-06-23 DIAGNOSIS — Z886 Allergy status to analgesic agent status: Secondary | ICD-10-CM | POA: Diagnosis not present

## 2018-06-23 DIAGNOSIS — G894 Chronic pain syndrome: Secondary | ICD-10-CM | POA: Diagnosis not present

## 2018-06-23 DIAGNOSIS — I9589 Other hypotension: Secondary | ICD-10-CM | POA: Diagnosis not present

## 2018-06-23 DIAGNOSIS — I959 Hypotension, unspecified: Secondary | ICD-10-CM | POA: Diagnosis not present

## 2018-06-23 DIAGNOSIS — J309 Allergic rhinitis, unspecified: Secondary | ICD-10-CM | POA: Diagnosis not present

## 2018-06-23 DIAGNOSIS — R05 Cough: Secondary | ICD-10-CM | POA: Diagnosis not present

## 2018-06-23 DIAGNOSIS — J15212 Pneumonia due to Methicillin resistant Staphylococcus aureus: Secondary | ICD-10-CM | POA: Diagnosis not present

## 2018-06-23 DIAGNOSIS — G92 Toxic encephalopathy: Secondary | ICD-10-CM | POA: Diagnosis not present

## 2018-06-23 DIAGNOSIS — R918 Other nonspecific abnormal finding of lung field: Secondary | ICD-10-CM | POA: Diagnosis not present

## 2018-06-23 DIAGNOSIS — J168 Pneumonia due to other specified infectious organisms: Secondary | ICD-10-CM | POA: Diagnosis not present

## 2018-07-08 DIAGNOSIS — M5136 Other intervertebral disc degeneration, lumbar region: Secondary | ICD-10-CM | POA: Diagnosis not present

## 2018-07-08 DIAGNOSIS — I890 Lymphedema, not elsewhere classified: Secondary | ICD-10-CM | POA: Diagnosis not present

## 2018-07-08 DIAGNOSIS — J15212 Pneumonia due to Methicillin resistant Staphylococcus aureus: Secondary | ICD-10-CM | POA: Diagnosis not present

## 2018-07-08 DIAGNOSIS — M5416 Radiculopathy, lumbar region: Secondary | ICD-10-CM | POA: Diagnosis not present

## 2018-07-08 DIAGNOSIS — R600 Localized edema: Secondary | ICD-10-CM | POA: Diagnosis not present

## 2018-07-08 DIAGNOSIS — J9600 Acute respiratory failure, unspecified whether with hypoxia or hypercapnia: Secondary | ICD-10-CM | POA: Diagnosis not present

## 2018-07-09 DIAGNOSIS — Z9981 Dependence on supplemental oxygen: Secondary | ICD-10-CM | POA: Diagnosis not present

## 2018-07-09 DIAGNOSIS — J189 Pneumonia, unspecified organism: Secondary | ICD-10-CM | POA: Diagnosis not present

## 2018-07-09 DIAGNOSIS — J441 Chronic obstructive pulmonary disease with (acute) exacerbation: Secondary | ICD-10-CM | POA: Diagnosis not present

## 2018-07-09 DIAGNOSIS — Z7952 Long term (current) use of systemic steroids: Secondary | ICD-10-CM | POA: Diagnosis not present

## 2018-07-09 DIAGNOSIS — D649 Anemia, unspecified: Secondary | ICD-10-CM | POA: Diagnosis not present

## 2018-07-09 DIAGNOSIS — I4510 Unspecified right bundle-branch block: Secondary | ICD-10-CM | POA: Diagnosis not present

## 2018-07-09 DIAGNOSIS — M542 Cervicalgia: Secondary | ICD-10-CM | POA: Diagnosis not present

## 2018-07-09 DIAGNOSIS — L989 Disorder of the skin and subcutaneous tissue, unspecified: Secondary | ICD-10-CM | POA: Diagnosis not present

## 2018-07-09 DIAGNOSIS — J440 Chronic obstructive pulmonary disease with acute lower respiratory infection: Secondary | ICD-10-CM | POA: Diagnosis not present

## 2018-07-09 DIAGNOSIS — K5090 Crohn's disease, unspecified, without complications: Secondary | ICD-10-CM | POA: Diagnosis not present

## 2018-07-09 DIAGNOSIS — G8929 Other chronic pain: Secondary | ICD-10-CM | POA: Diagnosis not present

## 2018-07-09 DIAGNOSIS — J9621 Acute and chronic respiratory failure with hypoxia: Secondary | ICD-10-CM | POA: Diagnosis not present

## 2018-07-09 DIAGNOSIS — M545 Low back pain: Secondary | ICD-10-CM | POA: Diagnosis not present

## 2018-07-09 DIAGNOSIS — Z7951 Long term (current) use of inhaled steroids: Secondary | ICD-10-CM | POA: Diagnosis not present

## 2018-07-11 DIAGNOSIS — R600 Localized edema: Secondary | ICD-10-CM | POA: Diagnosis not present

## 2018-07-11 DIAGNOSIS — G8929 Other chronic pain: Secondary | ICD-10-CM | POA: Diagnosis not present

## 2018-07-11 DIAGNOSIS — I890 Lymphedema, not elsewhere classified: Secondary | ICD-10-CM | POA: Diagnosis not present

## 2018-07-11 DIAGNOSIS — K5090 Crohn's disease, unspecified, without complications: Secondary | ICD-10-CM | POA: Diagnosis not present

## 2018-07-11 DIAGNOSIS — D649 Anemia, unspecified: Secondary | ICD-10-CM | POA: Diagnosis not present

## 2018-07-11 DIAGNOSIS — Z7951 Long term (current) use of inhaled steroids: Secondary | ICD-10-CM | POA: Diagnosis not present

## 2018-07-11 DIAGNOSIS — Z7952 Long term (current) use of systemic steroids: Secondary | ICD-10-CM | POA: Diagnosis not present

## 2018-07-11 DIAGNOSIS — J440 Chronic obstructive pulmonary disease with acute lower respiratory infection: Secondary | ICD-10-CM | POA: Diagnosis not present

## 2018-07-11 DIAGNOSIS — J189 Pneumonia, unspecified organism: Secondary | ICD-10-CM | POA: Diagnosis not present

## 2018-07-11 DIAGNOSIS — Z9981 Dependence on supplemental oxygen: Secondary | ICD-10-CM | POA: Diagnosis not present

## 2018-07-11 DIAGNOSIS — M545 Low back pain: Secondary | ICD-10-CM | POA: Diagnosis not present

## 2018-07-11 DIAGNOSIS — J9621 Acute and chronic respiratory failure with hypoxia: Secondary | ICD-10-CM | POA: Diagnosis not present

## 2018-07-11 DIAGNOSIS — J441 Chronic obstructive pulmonary disease with (acute) exacerbation: Secondary | ICD-10-CM | POA: Diagnosis not present

## 2018-07-11 DIAGNOSIS — M542 Cervicalgia: Secondary | ICD-10-CM | POA: Diagnosis not present

## 2018-07-11 DIAGNOSIS — L989 Disorder of the skin and subcutaneous tissue, unspecified: Secondary | ICD-10-CM | POA: Diagnosis not present

## 2018-07-11 DIAGNOSIS — I4510 Unspecified right bundle-branch block: Secondary | ICD-10-CM | POA: Diagnosis not present

## 2018-07-15 DIAGNOSIS — L989 Disorder of the skin and subcutaneous tissue, unspecified: Secondary | ICD-10-CM | POA: Diagnosis not present

## 2018-07-15 DIAGNOSIS — G8929 Other chronic pain: Secondary | ICD-10-CM | POA: Diagnosis not present

## 2018-07-15 DIAGNOSIS — J189 Pneumonia, unspecified organism: Secondary | ICD-10-CM | POA: Diagnosis not present

## 2018-07-15 DIAGNOSIS — Z9981 Dependence on supplemental oxygen: Secondary | ICD-10-CM | POA: Diagnosis not present

## 2018-07-15 DIAGNOSIS — M542 Cervicalgia: Secondary | ICD-10-CM | POA: Diagnosis not present

## 2018-07-15 DIAGNOSIS — R600 Localized edema: Secondary | ICD-10-CM | POA: Diagnosis not present

## 2018-07-15 DIAGNOSIS — J440 Chronic obstructive pulmonary disease with acute lower respiratory infection: Secondary | ICD-10-CM | POA: Diagnosis not present

## 2018-07-15 DIAGNOSIS — K5090 Crohn's disease, unspecified, without complications: Secondary | ICD-10-CM | POA: Diagnosis not present

## 2018-07-15 DIAGNOSIS — J441 Chronic obstructive pulmonary disease with (acute) exacerbation: Secondary | ICD-10-CM | POA: Diagnosis not present

## 2018-07-15 DIAGNOSIS — M545 Low back pain: Secondary | ICD-10-CM | POA: Diagnosis not present

## 2018-07-15 DIAGNOSIS — I890 Lymphedema, not elsewhere classified: Secondary | ICD-10-CM | POA: Diagnosis not present

## 2018-07-15 DIAGNOSIS — Z7952 Long term (current) use of systemic steroids: Secondary | ICD-10-CM | POA: Diagnosis not present

## 2018-07-15 DIAGNOSIS — Z7951 Long term (current) use of inhaled steroids: Secondary | ICD-10-CM | POA: Diagnosis not present

## 2018-07-15 DIAGNOSIS — D649 Anemia, unspecified: Secondary | ICD-10-CM | POA: Diagnosis not present

## 2018-07-15 DIAGNOSIS — J9621 Acute and chronic respiratory failure with hypoxia: Secondary | ICD-10-CM | POA: Diagnosis not present

## 2018-07-15 DIAGNOSIS — I4510 Unspecified right bundle-branch block: Secondary | ICD-10-CM | POA: Diagnosis not present

## 2018-07-16 DIAGNOSIS — M545 Low back pain: Secondary | ICD-10-CM | POA: Diagnosis not present

## 2018-07-16 DIAGNOSIS — J189 Pneumonia, unspecified organism: Secondary | ICD-10-CM | POA: Diagnosis not present

## 2018-07-16 DIAGNOSIS — K5090 Crohn's disease, unspecified, without complications: Secondary | ICD-10-CM | POA: Diagnosis not present

## 2018-07-16 DIAGNOSIS — Z7952 Long term (current) use of systemic steroids: Secondary | ICD-10-CM | POA: Diagnosis not present

## 2018-07-16 DIAGNOSIS — D649 Anemia, unspecified: Secondary | ICD-10-CM | POA: Diagnosis not present

## 2018-07-16 DIAGNOSIS — J441 Chronic obstructive pulmonary disease with (acute) exacerbation: Secondary | ICD-10-CM | POA: Diagnosis not present

## 2018-07-16 DIAGNOSIS — Z9981 Dependence on supplemental oxygen: Secondary | ICD-10-CM | POA: Diagnosis not present

## 2018-07-16 DIAGNOSIS — Z7951 Long term (current) use of inhaled steroids: Secondary | ICD-10-CM | POA: Diagnosis not present

## 2018-07-16 DIAGNOSIS — I4510 Unspecified right bundle-branch block: Secondary | ICD-10-CM | POA: Diagnosis not present

## 2018-07-16 DIAGNOSIS — M542 Cervicalgia: Secondary | ICD-10-CM | POA: Diagnosis not present

## 2018-07-16 DIAGNOSIS — L989 Disorder of the skin and subcutaneous tissue, unspecified: Secondary | ICD-10-CM | POA: Diagnosis not present

## 2018-07-16 DIAGNOSIS — J9621 Acute and chronic respiratory failure with hypoxia: Secondary | ICD-10-CM | POA: Diagnosis not present

## 2018-07-16 DIAGNOSIS — J440 Chronic obstructive pulmonary disease with acute lower respiratory infection: Secondary | ICD-10-CM | POA: Diagnosis not present

## 2018-07-16 DIAGNOSIS — G8929 Other chronic pain: Secondary | ICD-10-CM | POA: Diagnosis not present

## 2018-07-17 DIAGNOSIS — Z7952 Long term (current) use of systemic steroids: Secondary | ICD-10-CM | POA: Diagnosis not present

## 2018-07-17 DIAGNOSIS — L989 Disorder of the skin and subcutaneous tissue, unspecified: Secondary | ICD-10-CM | POA: Diagnosis not present

## 2018-07-17 DIAGNOSIS — G8929 Other chronic pain: Secondary | ICD-10-CM | POA: Diagnosis not present

## 2018-07-17 DIAGNOSIS — Z7951 Long term (current) use of inhaled steroids: Secondary | ICD-10-CM | POA: Diagnosis not present

## 2018-07-17 DIAGNOSIS — J189 Pneumonia, unspecified organism: Secondary | ICD-10-CM | POA: Diagnosis not present

## 2018-07-17 DIAGNOSIS — D649 Anemia, unspecified: Secondary | ICD-10-CM | POA: Diagnosis not present

## 2018-07-17 DIAGNOSIS — I4510 Unspecified right bundle-branch block: Secondary | ICD-10-CM | POA: Diagnosis not present

## 2018-07-17 DIAGNOSIS — K5090 Crohn's disease, unspecified, without complications: Secondary | ICD-10-CM | POA: Diagnosis not present

## 2018-07-17 DIAGNOSIS — J440 Chronic obstructive pulmonary disease with acute lower respiratory infection: Secondary | ICD-10-CM | POA: Diagnosis not present

## 2018-07-17 DIAGNOSIS — M542 Cervicalgia: Secondary | ICD-10-CM | POA: Diagnosis not present

## 2018-07-17 DIAGNOSIS — J9621 Acute and chronic respiratory failure with hypoxia: Secondary | ICD-10-CM | POA: Diagnosis not present

## 2018-07-17 DIAGNOSIS — J441 Chronic obstructive pulmonary disease with (acute) exacerbation: Secondary | ICD-10-CM | POA: Diagnosis not present

## 2018-07-17 DIAGNOSIS — Z9981 Dependence on supplemental oxygen: Secondary | ICD-10-CM | POA: Diagnosis not present

## 2018-07-17 DIAGNOSIS — M545 Low back pain: Secondary | ICD-10-CM | POA: Diagnosis not present

## 2018-07-18 DIAGNOSIS — G894 Chronic pain syndrome: Secondary | ICD-10-CM | POA: Diagnosis not present

## 2018-07-18 DIAGNOSIS — Z5181 Encounter for therapeutic drug level monitoring: Secondary | ICD-10-CM | POA: Diagnosis not present

## 2018-07-18 DIAGNOSIS — M6281 Muscle weakness (generalized): Secondary | ICD-10-CM | POA: Diagnosis not present

## 2018-07-18 DIAGNOSIS — K5090 Crohn's disease, unspecified, without complications: Secondary | ICD-10-CM | POA: Diagnosis not present

## 2018-07-18 DIAGNOSIS — R600 Localized edema: Secondary | ICD-10-CM | POA: Diagnosis not present

## 2018-07-22 DIAGNOSIS — L989 Disorder of the skin and subcutaneous tissue, unspecified: Secondary | ICD-10-CM | POA: Diagnosis not present

## 2018-07-22 DIAGNOSIS — Z9981 Dependence on supplemental oxygen: Secondary | ICD-10-CM | POA: Diagnosis not present

## 2018-07-22 DIAGNOSIS — J441 Chronic obstructive pulmonary disease with (acute) exacerbation: Secondary | ICD-10-CM | POA: Diagnosis not present

## 2018-07-22 DIAGNOSIS — G8929 Other chronic pain: Secondary | ICD-10-CM | POA: Diagnosis not present

## 2018-07-22 DIAGNOSIS — Z7952 Long term (current) use of systemic steroids: Secondary | ICD-10-CM | POA: Diagnosis not present

## 2018-07-22 DIAGNOSIS — M545 Low back pain: Secondary | ICD-10-CM | POA: Diagnosis not present

## 2018-07-22 DIAGNOSIS — I4510 Unspecified right bundle-branch block: Secondary | ICD-10-CM | POA: Diagnosis not present

## 2018-07-22 DIAGNOSIS — M542 Cervicalgia: Secondary | ICD-10-CM | POA: Diagnosis not present

## 2018-07-22 DIAGNOSIS — K5090 Crohn's disease, unspecified, without complications: Secondary | ICD-10-CM | POA: Diagnosis not present

## 2018-07-22 DIAGNOSIS — D649 Anemia, unspecified: Secondary | ICD-10-CM | POA: Diagnosis not present

## 2018-07-22 DIAGNOSIS — J440 Chronic obstructive pulmonary disease with acute lower respiratory infection: Secondary | ICD-10-CM | POA: Diagnosis not present

## 2018-07-22 DIAGNOSIS — Z7951 Long term (current) use of inhaled steroids: Secondary | ICD-10-CM | POA: Diagnosis not present

## 2018-07-22 DIAGNOSIS — J9621 Acute and chronic respiratory failure with hypoxia: Secondary | ICD-10-CM | POA: Diagnosis not present

## 2018-07-22 DIAGNOSIS — J189 Pneumonia, unspecified organism: Secondary | ICD-10-CM | POA: Diagnosis not present

## 2018-07-23 DIAGNOSIS — L989 Disorder of the skin and subcutaneous tissue, unspecified: Secondary | ICD-10-CM | POA: Diagnosis not present

## 2018-07-23 DIAGNOSIS — Z7951 Long term (current) use of inhaled steroids: Secondary | ICD-10-CM | POA: Diagnosis not present

## 2018-07-23 DIAGNOSIS — I4510 Unspecified right bundle-branch block: Secondary | ICD-10-CM | POA: Diagnosis not present

## 2018-07-23 DIAGNOSIS — M6281 Muscle weakness (generalized): Secondary | ICD-10-CM | POA: Diagnosis not present

## 2018-07-23 DIAGNOSIS — G894 Chronic pain syndrome: Secondary | ICD-10-CM | POA: Diagnosis not present

## 2018-07-23 DIAGNOSIS — J309 Allergic rhinitis, unspecified: Secondary | ICD-10-CM | POA: Diagnosis not present

## 2018-07-23 DIAGNOSIS — J449 Chronic obstructive pulmonary disease, unspecified: Secondary | ICD-10-CM | POA: Diagnosis not present

## 2018-07-23 DIAGNOSIS — D649 Anemia, unspecified: Secondary | ICD-10-CM | POA: Diagnosis not present

## 2018-07-23 DIAGNOSIS — K5090 Crohn's disease, unspecified, without complications: Secondary | ICD-10-CM | POA: Diagnosis not present

## 2018-07-23 DIAGNOSIS — J9621 Acute and chronic respiratory failure with hypoxia: Secondary | ICD-10-CM | POA: Diagnosis not present

## 2018-07-23 DIAGNOSIS — G8929 Other chronic pain: Secondary | ICD-10-CM | POA: Diagnosis not present

## 2018-07-23 DIAGNOSIS — M545 Low back pain: Secondary | ICD-10-CM | POA: Diagnosis not present

## 2018-07-23 DIAGNOSIS — M542 Cervicalgia: Secondary | ICD-10-CM | POA: Diagnosis not present

## 2018-07-23 DIAGNOSIS — J189 Pneumonia, unspecified organism: Secondary | ICD-10-CM | POA: Diagnosis not present

## 2018-07-23 DIAGNOSIS — J440 Chronic obstructive pulmonary disease with acute lower respiratory infection: Secondary | ICD-10-CM | POA: Diagnosis not present

## 2018-07-23 DIAGNOSIS — Z9981 Dependence on supplemental oxygen: Secondary | ICD-10-CM | POA: Diagnosis not present

## 2018-07-23 DIAGNOSIS — Z7952 Long term (current) use of systemic steroids: Secondary | ICD-10-CM | POA: Diagnosis not present

## 2018-07-23 DIAGNOSIS — J441 Chronic obstructive pulmonary disease with (acute) exacerbation: Secondary | ICD-10-CM | POA: Diagnosis not present

## 2018-07-23 DIAGNOSIS — R609 Edema, unspecified: Secondary | ICD-10-CM | POA: Diagnosis not present

## 2018-07-24 DIAGNOSIS — Z9981 Dependence on supplemental oxygen: Secondary | ICD-10-CM | POA: Diagnosis not present

## 2018-07-24 DIAGNOSIS — G8929 Other chronic pain: Secondary | ICD-10-CM | POA: Diagnosis not present

## 2018-07-24 DIAGNOSIS — J189 Pneumonia, unspecified organism: Secondary | ICD-10-CM | POA: Diagnosis not present

## 2018-07-24 DIAGNOSIS — Z7952 Long term (current) use of systemic steroids: Secondary | ICD-10-CM | POA: Diagnosis not present

## 2018-07-24 DIAGNOSIS — Z7951 Long term (current) use of inhaled steroids: Secondary | ICD-10-CM | POA: Diagnosis not present

## 2018-07-24 DIAGNOSIS — J440 Chronic obstructive pulmonary disease with acute lower respiratory infection: Secondary | ICD-10-CM | POA: Diagnosis not present

## 2018-07-24 DIAGNOSIS — M542 Cervicalgia: Secondary | ICD-10-CM | POA: Diagnosis not present

## 2018-07-24 DIAGNOSIS — J9621 Acute and chronic respiratory failure with hypoxia: Secondary | ICD-10-CM | POA: Diagnosis not present

## 2018-07-24 DIAGNOSIS — L989 Disorder of the skin and subcutaneous tissue, unspecified: Secondary | ICD-10-CM | POA: Diagnosis not present

## 2018-07-24 DIAGNOSIS — I4510 Unspecified right bundle-branch block: Secondary | ICD-10-CM | POA: Diagnosis not present

## 2018-07-24 DIAGNOSIS — D649 Anemia, unspecified: Secondary | ICD-10-CM | POA: Diagnosis not present

## 2018-07-24 DIAGNOSIS — K5090 Crohn's disease, unspecified, without complications: Secondary | ICD-10-CM | POA: Diagnosis not present

## 2018-07-24 DIAGNOSIS — J441 Chronic obstructive pulmonary disease with (acute) exacerbation: Secondary | ICD-10-CM | POA: Diagnosis not present

## 2018-07-24 DIAGNOSIS — M545 Low back pain: Secondary | ICD-10-CM | POA: Diagnosis not present

## 2018-07-29 DIAGNOSIS — L989 Disorder of the skin and subcutaneous tissue, unspecified: Secondary | ICD-10-CM | POA: Diagnosis not present

## 2018-07-29 DIAGNOSIS — D649 Anemia, unspecified: Secondary | ICD-10-CM | POA: Diagnosis not present

## 2018-07-29 DIAGNOSIS — J440 Chronic obstructive pulmonary disease with acute lower respiratory infection: Secondary | ICD-10-CM | POA: Diagnosis not present

## 2018-07-29 DIAGNOSIS — J189 Pneumonia, unspecified organism: Secondary | ICD-10-CM | POA: Diagnosis not present

## 2018-07-29 DIAGNOSIS — K5090 Crohn's disease, unspecified, without complications: Secondary | ICD-10-CM | POA: Diagnosis not present

## 2018-07-29 DIAGNOSIS — M545 Low back pain: Secondary | ICD-10-CM | POA: Diagnosis not present

## 2018-07-29 DIAGNOSIS — M542 Cervicalgia: Secondary | ICD-10-CM | POA: Diagnosis not present

## 2018-07-29 DIAGNOSIS — Z7951 Long term (current) use of inhaled steroids: Secondary | ICD-10-CM | POA: Diagnosis not present

## 2018-07-29 DIAGNOSIS — I4510 Unspecified right bundle-branch block: Secondary | ICD-10-CM | POA: Diagnosis not present

## 2018-07-29 DIAGNOSIS — Z9981 Dependence on supplemental oxygen: Secondary | ICD-10-CM | POA: Diagnosis not present

## 2018-07-29 DIAGNOSIS — J441 Chronic obstructive pulmonary disease with (acute) exacerbation: Secondary | ICD-10-CM | POA: Diagnosis not present

## 2018-07-29 DIAGNOSIS — G8929 Other chronic pain: Secondary | ICD-10-CM | POA: Diagnosis not present

## 2018-07-29 DIAGNOSIS — Z7952 Long term (current) use of systemic steroids: Secondary | ICD-10-CM | POA: Diagnosis not present

## 2018-07-29 DIAGNOSIS — J9621 Acute and chronic respiratory failure with hypoxia: Secondary | ICD-10-CM | POA: Diagnosis not present

## 2018-07-30 DIAGNOSIS — H5213 Myopia, bilateral: Secondary | ICD-10-CM | POA: Diagnosis not present

## 2018-08-01 DIAGNOSIS — J449 Chronic obstructive pulmonary disease, unspecified: Secondary | ICD-10-CM | POA: Diagnosis not present

## 2018-08-01 DIAGNOSIS — R609 Edema, unspecified: Secondary | ICD-10-CM | POA: Diagnosis not present

## 2018-08-04 DIAGNOSIS — J189 Pneumonia, unspecified organism: Secondary | ICD-10-CM | POA: Diagnosis not present

## 2018-08-04 DIAGNOSIS — J441 Chronic obstructive pulmonary disease with (acute) exacerbation: Secondary | ICD-10-CM | POA: Diagnosis not present

## 2018-08-04 DIAGNOSIS — D649 Anemia, unspecified: Secondary | ICD-10-CM | POA: Diagnosis not present

## 2018-08-04 DIAGNOSIS — L989 Disorder of the skin and subcutaneous tissue, unspecified: Secondary | ICD-10-CM | POA: Diagnosis not present

## 2018-08-04 DIAGNOSIS — I4510 Unspecified right bundle-branch block: Secondary | ICD-10-CM | POA: Diagnosis not present

## 2018-08-04 DIAGNOSIS — M545 Low back pain: Secondary | ICD-10-CM | POA: Diagnosis not present

## 2018-08-04 DIAGNOSIS — Z7951 Long term (current) use of inhaled steroids: Secondary | ICD-10-CM | POA: Diagnosis not present

## 2018-08-04 DIAGNOSIS — J440 Chronic obstructive pulmonary disease with acute lower respiratory infection: Secondary | ICD-10-CM | POA: Diagnosis not present

## 2018-08-04 DIAGNOSIS — J9621 Acute and chronic respiratory failure with hypoxia: Secondary | ICD-10-CM | POA: Diagnosis not present

## 2018-08-04 DIAGNOSIS — M542 Cervicalgia: Secondary | ICD-10-CM | POA: Diagnosis not present

## 2018-08-04 DIAGNOSIS — Z7952 Long term (current) use of systemic steroids: Secondary | ICD-10-CM | POA: Diagnosis not present

## 2018-08-04 DIAGNOSIS — Z9981 Dependence on supplemental oxygen: Secondary | ICD-10-CM | POA: Diagnosis not present

## 2018-08-04 DIAGNOSIS — G8929 Other chronic pain: Secondary | ICD-10-CM | POA: Diagnosis not present

## 2018-08-04 DIAGNOSIS — K5090 Crohn's disease, unspecified, without complications: Secondary | ICD-10-CM | POA: Diagnosis not present

## 2018-08-07 DIAGNOSIS — E559 Vitamin D deficiency, unspecified: Secondary | ICD-10-CM | POA: Diagnosis not present

## 2018-08-07 DIAGNOSIS — I10 Essential (primary) hypertension: Secondary | ICD-10-CM | POA: Diagnosis not present

## 2018-08-07 DIAGNOSIS — R7309 Other abnormal glucose: Secondary | ICD-10-CM | POA: Diagnosis not present

## 2018-08-07 DIAGNOSIS — Z0000 Encounter for general adult medical examination without abnormal findings: Secondary | ICD-10-CM | POA: Diagnosis not present

## 2018-08-08 DIAGNOSIS — D649 Anemia, unspecified: Secondary | ICD-10-CM | POA: Diagnosis not present

## 2018-08-08 DIAGNOSIS — J440 Chronic obstructive pulmonary disease with acute lower respiratory infection: Secondary | ICD-10-CM | POA: Diagnosis not present

## 2018-08-08 DIAGNOSIS — G8929 Other chronic pain: Secondary | ICD-10-CM | POA: Diagnosis not present

## 2018-08-08 DIAGNOSIS — I4510 Unspecified right bundle-branch block: Secondary | ICD-10-CM | POA: Diagnosis not present

## 2018-08-08 DIAGNOSIS — M545 Low back pain: Secondary | ICD-10-CM | POA: Diagnosis not present

## 2018-08-08 DIAGNOSIS — Z9981 Dependence on supplemental oxygen: Secondary | ICD-10-CM | POA: Diagnosis not present

## 2018-08-08 DIAGNOSIS — L989 Disorder of the skin and subcutaneous tissue, unspecified: Secondary | ICD-10-CM | POA: Diagnosis not present

## 2018-08-08 DIAGNOSIS — J189 Pneumonia, unspecified organism: Secondary | ICD-10-CM | POA: Diagnosis not present

## 2018-08-08 DIAGNOSIS — K5090 Crohn's disease, unspecified, without complications: Secondary | ICD-10-CM | POA: Diagnosis not present

## 2018-08-08 DIAGNOSIS — M542 Cervicalgia: Secondary | ICD-10-CM | POA: Diagnosis not present

## 2018-08-08 DIAGNOSIS — Z7951 Long term (current) use of inhaled steroids: Secondary | ICD-10-CM | POA: Diagnosis not present

## 2018-08-08 DIAGNOSIS — Z7952 Long term (current) use of systemic steroids: Secondary | ICD-10-CM | POA: Diagnosis not present

## 2018-08-08 DIAGNOSIS — J9621 Acute and chronic respiratory failure with hypoxia: Secondary | ICD-10-CM | POA: Diagnosis not present

## 2018-08-08 DIAGNOSIS — J441 Chronic obstructive pulmonary disease with (acute) exacerbation: Secondary | ICD-10-CM | POA: Diagnosis not present

## 2018-08-11 DIAGNOSIS — K5090 Crohn's disease, unspecified, without complications: Secondary | ICD-10-CM | POA: Diagnosis not present

## 2018-08-11 DIAGNOSIS — I4510 Unspecified right bundle-branch block: Secondary | ICD-10-CM | POA: Diagnosis not present

## 2018-08-11 DIAGNOSIS — J9621 Acute and chronic respiratory failure with hypoxia: Secondary | ICD-10-CM | POA: Diagnosis not present

## 2018-08-11 DIAGNOSIS — G8929 Other chronic pain: Secondary | ICD-10-CM | POA: Diagnosis not present

## 2018-08-11 DIAGNOSIS — Z7952 Long term (current) use of systemic steroids: Secondary | ICD-10-CM | POA: Diagnosis not present

## 2018-08-11 DIAGNOSIS — J441 Chronic obstructive pulmonary disease with (acute) exacerbation: Secondary | ICD-10-CM | POA: Diagnosis not present

## 2018-08-11 DIAGNOSIS — Z9981 Dependence on supplemental oxygen: Secondary | ICD-10-CM | POA: Diagnosis not present

## 2018-08-11 DIAGNOSIS — M542 Cervicalgia: Secondary | ICD-10-CM | POA: Diagnosis not present

## 2018-08-11 DIAGNOSIS — J189 Pneumonia, unspecified organism: Secondary | ICD-10-CM | POA: Diagnosis not present

## 2018-08-11 DIAGNOSIS — L989 Disorder of the skin and subcutaneous tissue, unspecified: Secondary | ICD-10-CM | POA: Diagnosis not present

## 2018-08-11 DIAGNOSIS — M545 Low back pain: Secondary | ICD-10-CM | POA: Diagnosis not present

## 2018-08-11 DIAGNOSIS — J440 Chronic obstructive pulmonary disease with acute lower respiratory infection: Secondary | ICD-10-CM | POA: Diagnosis not present

## 2018-08-11 DIAGNOSIS — Z7951 Long term (current) use of inhaled steroids: Secondary | ICD-10-CM | POA: Diagnosis not present

## 2018-08-11 DIAGNOSIS — D649 Anemia, unspecified: Secondary | ICD-10-CM | POA: Diagnosis not present

## 2018-08-13 DIAGNOSIS — R7309 Other abnormal glucose: Secondary | ICD-10-CM | POA: Diagnosis not present

## 2018-08-13 DIAGNOSIS — Z7951 Long term (current) use of inhaled steroids: Secondary | ICD-10-CM | POA: Diagnosis not present

## 2018-08-13 DIAGNOSIS — G8929 Other chronic pain: Secondary | ICD-10-CM | POA: Diagnosis not present

## 2018-08-13 DIAGNOSIS — Z0000 Encounter for general adult medical examination without abnormal findings: Secondary | ICD-10-CM | POA: Diagnosis not present

## 2018-08-13 DIAGNOSIS — J189 Pneumonia, unspecified organism: Secondary | ICD-10-CM | POA: Diagnosis not present

## 2018-08-13 DIAGNOSIS — J9621 Acute and chronic respiratory failure with hypoxia: Secondary | ICD-10-CM | POA: Diagnosis not present

## 2018-08-13 DIAGNOSIS — D649 Anemia, unspecified: Secondary | ICD-10-CM | POA: Diagnosis not present

## 2018-08-13 DIAGNOSIS — J441 Chronic obstructive pulmonary disease with (acute) exacerbation: Secondary | ICD-10-CM | POA: Diagnosis not present

## 2018-08-13 DIAGNOSIS — E559 Vitamin D deficiency, unspecified: Secondary | ICD-10-CM | POA: Diagnosis not present

## 2018-08-13 DIAGNOSIS — M545 Low back pain: Secondary | ICD-10-CM | POA: Diagnosis not present

## 2018-08-13 DIAGNOSIS — M542 Cervicalgia: Secondary | ICD-10-CM | POA: Diagnosis not present

## 2018-08-13 DIAGNOSIS — Z9981 Dependence on supplemental oxygen: Secondary | ICD-10-CM | POA: Diagnosis not present

## 2018-08-13 DIAGNOSIS — K5090 Crohn's disease, unspecified, without complications: Secondary | ICD-10-CM | POA: Diagnosis not present

## 2018-08-13 DIAGNOSIS — J440 Chronic obstructive pulmonary disease with acute lower respiratory infection: Secondary | ICD-10-CM | POA: Diagnosis not present

## 2018-08-13 DIAGNOSIS — I4510 Unspecified right bundle-branch block: Secondary | ICD-10-CM | POA: Diagnosis not present

## 2018-08-13 DIAGNOSIS — Z7952 Long term (current) use of systemic steroids: Secondary | ICD-10-CM | POA: Diagnosis not present

## 2018-08-13 DIAGNOSIS — L989 Disorder of the skin and subcutaneous tissue, unspecified: Secondary | ICD-10-CM | POA: Diagnosis not present

## 2018-08-15 DIAGNOSIS — G894 Chronic pain syndrome: Secondary | ICD-10-CM | POA: Diagnosis not present

## 2018-08-15 DIAGNOSIS — Z5181 Encounter for therapeutic drug level monitoring: Secondary | ICD-10-CM | POA: Diagnosis not present

## 2018-08-15 DIAGNOSIS — Z79899 Other long term (current) drug therapy: Secondary | ICD-10-CM | POA: Diagnosis not present

## 2018-08-15 DIAGNOSIS — E559 Vitamin D deficiency, unspecified: Secondary | ICD-10-CM | POA: Diagnosis not present

## 2018-08-17 DIAGNOSIS — M1990 Unspecified osteoarthritis, unspecified site: Secondary | ICD-10-CM | POA: Diagnosis not present

## 2018-08-17 DIAGNOSIS — R102 Pelvic and perineal pain: Secondary | ICD-10-CM | POA: Diagnosis not present

## 2018-08-17 DIAGNOSIS — Z888 Allergy status to other drugs, medicaments and biological substances status: Secondary | ICD-10-CM | POA: Diagnosis not present

## 2018-08-17 DIAGNOSIS — Z883 Allergy status to other anti-infective agents status: Secondary | ICD-10-CM | POA: Diagnosis not present

## 2018-08-17 DIAGNOSIS — Z79891 Long term (current) use of opiate analgesic: Secondary | ICD-10-CM | POA: Diagnosis not present

## 2018-08-17 DIAGNOSIS — K5090 Crohn's disease, unspecified, without complications: Secondary | ICD-10-CM | POA: Diagnosis not present

## 2018-08-17 DIAGNOSIS — Z79899 Other long term (current) drug therapy: Secondary | ICD-10-CM | POA: Diagnosis not present

## 2018-08-17 DIAGNOSIS — Z7951 Long term (current) use of inhaled steroids: Secondary | ICD-10-CM | POA: Diagnosis not present

## 2018-08-17 DIAGNOSIS — Z886 Allergy status to analgesic agent status: Secondary | ICD-10-CM | POA: Diagnosis not present

## 2018-08-17 DIAGNOSIS — I10 Essential (primary) hypertension: Secondary | ICD-10-CM | POA: Diagnosis not present

## 2018-08-17 DIAGNOSIS — Z7952 Long term (current) use of systemic steroids: Secondary | ICD-10-CM | POA: Diagnosis not present

## 2018-08-21 DIAGNOSIS — M5136 Other intervertebral disc degeneration, lumbar region: Secondary | ICD-10-CM | POA: Diagnosis not present

## 2018-08-21 DIAGNOSIS — M5416 Radiculopathy, lumbar region: Secondary | ICD-10-CM | POA: Diagnosis not present

## 2018-08-23 DIAGNOSIS — J449 Chronic obstructive pulmonary disease, unspecified: Secondary | ICD-10-CM | POA: Diagnosis not present

## 2018-08-23 DIAGNOSIS — R609 Edema, unspecified: Secondary | ICD-10-CM | POA: Diagnosis not present

## 2018-09-01 DIAGNOSIS — J449 Chronic obstructive pulmonary disease, unspecified: Secondary | ICD-10-CM | POA: Diagnosis not present

## 2018-09-01 DIAGNOSIS — R609 Edema, unspecified: Secondary | ICD-10-CM | POA: Diagnosis not present

## 2018-09-15 DIAGNOSIS — R339 Retention of urine, unspecified: Secondary | ICD-10-CM | POA: Diagnosis not present

## 2018-09-15 DIAGNOSIS — Z5181 Encounter for therapeutic drug level monitoring: Secondary | ICD-10-CM | POA: Diagnosis not present

## 2018-09-15 DIAGNOSIS — J329 Chronic sinusitis, unspecified: Secondary | ICD-10-CM | POA: Diagnosis not present

## 2018-09-15 DIAGNOSIS — K5090 Crohn's disease, unspecified, without complications: Secondary | ICD-10-CM | POA: Diagnosis not present

## 2018-09-15 DIAGNOSIS — G894 Chronic pain syndrome: Secondary | ICD-10-CM | POA: Diagnosis not present

## 2018-09-23 DIAGNOSIS — J449 Chronic obstructive pulmonary disease, unspecified: Secondary | ICD-10-CM | POA: Diagnosis not present

## 2018-09-23 DIAGNOSIS — R609 Edema, unspecified: Secondary | ICD-10-CM | POA: Diagnosis not present

## 2018-09-25 DIAGNOSIS — M79642 Pain in left hand: Secondary | ICD-10-CM | POA: Diagnosis not present

## 2018-09-25 DIAGNOSIS — M19041 Primary osteoarthritis, right hand: Secondary | ICD-10-CM | POA: Diagnosis not present

## 2018-09-25 DIAGNOSIS — M79641 Pain in right hand: Secondary | ICD-10-CM | POA: Diagnosis not present

## 2018-09-25 DIAGNOSIS — M19042 Primary osteoarthritis, left hand: Secondary | ICD-10-CM | POA: Diagnosis not present

## 2018-10-01 DIAGNOSIS — J449 Chronic obstructive pulmonary disease, unspecified: Secondary | ICD-10-CM | POA: Diagnosis not present

## 2018-10-01 DIAGNOSIS — R609 Edema, unspecified: Secondary | ICD-10-CM | POA: Diagnosis not present

## 2018-10-07 DIAGNOSIS — J329 Chronic sinusitis, unspecified: Secondary | ICD-10-CM | POA: Diagnosis not present

## 2018-10-07 DIAGNOSIS — G894 Chronic pain syndrome: Secondary | ICD-10-CM | POA: Diagnosis not present

## 2018-10-07 DIAGNOSIS — R339 Retention of urine, unspecified: Secondary | ICD-10-CM | POA: Diagnosis not present

## 2018-10-07 DIAGNOSIS — K5090 Crohn's disease, unspecified, without complications: Secondary | ICD-10-CM | POA: Diagnosis not present

## 2018-10-23 DIAGNOSIS — R609 Edema, unspecified: Secondary | ICD-10-CM | POA: Diagnosis not present

## 2018-10-23 DIAGNOSIS — J449 Chronic obstructive pulmonary disease, unspecified: Secondary | ICD-10-CM | POA: Diagnosis not present

## 2018-10-31 DIAGNOSIS — R1310 Dysphagia, unspecified: Secondary | ICD-10-CM | POA: Diagnosis not present

## 2018-10-31 DIAGNOSIS — K219 Gastro-esophageal reflux disease without esophagitis: Secondary | ICD-10-CM | POA: Diagnosis not present

## 2018-10-31 DIAGNOSIS — K50919 Crohn's disease, unspecified, with unspecified complications: Secondary | ICD-10-CM | POA: Diagnosis not present

## 2018-11-01 DIAGNOSIS — R609 Edema, unspecified: Secondary | ICD-10-CM | POA: Diagnosis not present

## 2018-11-01 DIAGNOSIS — J449 Chronic obstructive pulmonary disease, unspecified: Secondary | ICD-10-CM | POA: Diagnosis not present

## 2018-11-11 DIAGNOSIS — Z23 Encounter for immunization: Secondary | ICD-10-CM | POA: Diagnosis not present

## 2018-11-11 DIAGNOSIS — M25531 Pain in right wrist: Secondary | ICD-10-CM | POA: Diagnosis not present

## 2018-11-11 DIAGNOSIS — L0292 Furuncle, unspecified: Secondary | ICD-10-CM | POA: Diagnosis not present

## 2018-11-11 DIAGNOSIS — Z5181 Encounter for therapeutic drug level monitoring: Secondary | ICD-10-CM | POA: Diagnosis not present

## 2018-11-11 DIAGNOSIS — M25532 Pain in left wrist: Secondary | ICD-10-CM | POA: Diagnosis not present

## 2018-11-11 DIAGNOSIS — G894 Chronic pain syndrome: Secondary | ICD-10-CM | POA: Diagnosis not present

## 2018-11-20 DIAGNOSIS — M5416 Radiculopathy, lumbar region: Secondary | ICD-10-CM | POA: Diagnosis not present

## 2018-11-20 DIAGNOSIS — M5136 Other intervertebral disc degeneration, lumbar region: Secondary | ICD-10-CM | POA: Diagnosis not present

## 2018-11-20 DIAGNOSIS — I10 Essential (primary) hypertension: Secondary | ICD-10-CM | POA: Diagnosis not present

## 2018-11-20 DIAGNOSIS — Z79891 Long term (current) use of opiate analgesic: Secondary | ICD-10-CM | POA: Diagnosis not present

## 2018-11-23 DIAGNOSIS — R609 Edema, unspecified: Secondary | ICD-10-CM | POA: Diagnosis not present

## 2018-11-23 DIAGNOSIS — J449 Chronic obstructive pulmonary disease, unspecified: Secondary | ICD-10-CM | POA: Diagnosis not present

## 2018-12-01 DIAGNOSIS — J449 Chronic obstructive pulmonary disease, unspecified: Secondary | ICD-10-CM | POA: Diagnosis not present

## 2018-12-01 DIAGNOSIS — R609 Edema, unspecified: Secondary | ICD-10-CM | POA: Diagnosis not present

## 2018-12-05 DIAGNOSIS — G894 Chronic pain syndrome: Secondary | ICD-10-CM | POA: Diagnosis not present

## 2018-12-05 DIAGNOSIS — L02429 Furuncle of limb, unspecified: Secondary | ICD-10-CM | POA: Diagnosis not present

## 2018-12-23 DIAGNOSIS — J449 Chronic obstructive pulmonary disease, unspecified: Secondary | ICD-10-CM | POA: Diagnosis not present

## 2018-12-23 DIAGNOSIS — R609 Edema, unspecified: Secondary | ICD-10-CM | POA: Diagnosis not present

## 2018-12-29 DIAGNOSIS — Z87891 Personal history of nicotine dependence: Secondary | ICD-10-CM | POA: Diagnosis not present

## 2018-12-29 DIAGNOSIS — M1990 Unspecified osteoarthritis, unspecified site: Secondary | ICD-10-CM | POA: Diagnosis not present

## 2018-12-29 DIAGNOSIS — K222 Esophageal obstruction: Secondary | ICD-10-CM | POA: Diagnosis not present

## 2018-12-29 DIAGNOSIS — K562 Volvulus: Secondary | ICD-10-CM | POA: Diagnosis not present

## 2018-12-29 DIAGNOSIS — I4519 Other right bundle-branch block: Secondary | ICD-10-CM | POA: Diagnosis not present

## 2018-12-29 DIAGNOSIS — I10 Essential (primary) hypertension: Secondary | ICD-10-CM | POA: Diagnosis not present

## 2018-12-29 DIAGNOSIS — K208 Other esophagitis: Secondary | ICD-10-CM | POA: Diagnosis not present

## 2018-12-29 DIAGNOSIS — Z9049 Acquired absence of other specified parts of digestive tract: Secondary | ICD-10-CM | POA: Diagnosis not present

## 2018-12-29 DIAGNOSIS — Q438 Other specified congenital malformations of intestine: Secondary | ICD-10-CM | POA: Diagnosis not present

## 2018-12-29 DIAGNOSIS — K449 Diaphragmatic hernia without obstruction or gangrene: Secondary | ICD-10-CM | POA: Diagnosis not present

## 2018-12-29 DIAGNOSIS — Z8719 Personal history of other diseases of the digestive system: Secondary | ICD-10-CM | POA: Diagnosis not present

## 2018-12-29 DIAGNOSIS — Z1211 Encounter for screening for malignant neoplasm of colon: Secondary | ICD-10-CM | POA: Diagnosis not present

## 2019-01-01 DIAGNOSIS — R609 Edema, unspecified: Secondary | ICD-10-CM | POA: Diagnosis not present

## 2019-01-01 DIAGNOSIS — J449 Chronic obstructive pulmonary disease, unspecified: Secondary | ICD-10-CM | POA: Diagnosis not present

## 2019-01-05 DIAGNOSIS — Z79899 Other long term (current) drug therapy: Secondary | ICD-10-CM | POA: Diagnosis not present

## 2019-01-05 DIAGNOSIS — K5090 Crohn's disease, unspecified, without complications: Secondary | ICD-10-CM | POA: Diagnosis not present

## 2019-01-05 DIAGNOSIS — G894 Chronic pain syndrome: Secondary | ICD-10-CM | POA: Diagnosis not present

## 2019-01-05 DIAGNOSIS — R1030 Lower abdominal pain, unspecified: Secondary | ICD-10-CM | POA: Diagnosis not present

## 2019-01-05 DIAGNOSIS — Z5181 Encounter for therapeutic drug level monitoring: Secondary | ICD-10-CM | POA: Diagnosis not present

## 2019-01-15 DIAGNOSIS — G5601 Carpal tunnel syndrome, right upper limb: Secondary | ICD-10-CM | POA: Diagnosis not present

## 2019-01-15 DIAGNOSIS — M7989 Other specified soft tissue disorders: Secondary | ICD-10-CM | POA: Diagnosis not present

## 2019-01-15 DIAGNOSIS — R2232 Localized swelling, mass and lump, left upper limb: Secondary | ICD-10-CM | POA: Diagnosis not present

## 2019-01-15 DIAGNOSIS — M1812 Unilateral primary osteoarthritis of first carpometacarpal joint, left hand: Secondary | ICD-10-CM | POA: Diagnosis not present

## 2019-01-15 DIAGNOSIS — M79641 Pain in right hand: Secondary | ICD-10-CM | POA: Diagnosis not present

## 2019-01-20 DIAGNOSIS — R0989 Other specified symptoms and signs involving the circulatory and respiratory systems: Secondary | ICD-10-CM | POA: Diagnosis not present

## 2019-01-20 DIAGNOSIS — J069 Acute upper respiratory infection, unspecified: Secondary | ICD-10-CM | POA: Diagnosis not present

## 2019-01-21 DIAGNOSIS — R1084 Generalized abdominal pain: Secondary | ICD-10-CM | POA: Diagnosis not present

## 2019-01-21 DIAGNOSIS — K50919 Crohn's disease, unspecified, with unspecified complications: Secondary | ICD-10-CM | POA: Diagnosis not present

## 2019-01-23 DIAGNOSIS — R609 Edema, unspecified: Secondary | ICD-10-CM | POA: Diagnosis not present

## 2019-01-23 DIAGNOSIS — J449 Chronic obstructive pulmonary disease, unspecified: Secondary | ICD-10-CM | POA: Diagnosis not present

## 2019-01-30 DIAGNOSIS — K5090 Crohn's disease, unspecified, without complications: Secondary | ICD-10-CM | POA: Diagnosis not present

## 2019-01-30 DIAGNOSIS — K50919 Crohn's disease, unspecified, with unspecified complications: Secondary | ICD-10-CM | POA: Diagnosis not present

## 2019-03-02 ENCOUNTER — Other Ambulatory Visit: Payer: Self-pay

## 2019-03-02 NOTE — Patient Outreach (Signed)
[facility]) Care Management  03/02/2019  Nicole Fitzgerald 01-19-1961 [id number]   Referral Date:03/02/2019 Referral Source: [facility]  referral Referral Reason: Help with finding place to live and medication management   Outreach Attempt: no answer.  HIPAA compliant voice message left.    Plan: RN CM will attempt again within 4 business days and send letter.   Bary Leriche, RN, MSN [facility] Care Management Care Management Coordinator Direct Line [phone] Toll Free: [phone]  Fax: [phone]

## 2019-03-03 ENCOUNTER — Other Ambulatory Visit: Payer: Self-pay

## 2019-03-03 NOTE — Patient Outreach (Signed)
[facility]) Care Management  03/03/2019  Nicole Fitzgerald 21-Apr-1961 [id number]   Referral Date:03/02/2019 Referral Source: [facility]  referral Referral Reason: Help with finding place to live and medication management  Outreach Attempt: spoke with patient.  Introduced myself to patient several times.  Patient states she has a Engineer, civil (consulting) from [facility] that calls her and does not need another nurse calling her.  Patient declined to verify HIPAA.  Patient asked again for me to tell her who I was.  I introduced myself again and where I was calling from.  Advised patient that CM did send letter and brochure if she is skeptical of call.  Patient again states she does not need another nurse calling and ended call.     Plan: RN CM will close case.     Bary Leriche, RN, MSN [facility] Care Management Care Management Coordinator Direct Line [phone] Toll Free: [phone]  Fax: [phone]

## 2019-03-05 ENCOUNTER — Ambulatory Visit: Payer: Self-pay
